# Patient Record
Sex: Female | Born: 1980 | Race: White | Hispanic: No | Marital: Married | Smoking: Never smoker
Health system: Southern US, Community
[De-identification: ages and names within clinical notes are randomized; demographics above are authoritative.]

## PROBLEM LIST (undated history)

## (undated) DIAGNOSIS — G43909 Migraine, unspecified, not intractable, without status migrainosus: Secondary | ICD-10-CM

## (undated) DIAGNOSIS — N96 Recurrent pregnancy loss: Secondary | ICD-10-CM

## (undated) DIAGNOSIS — R87619 Unspecified abnormal cytological findings in specimens from cervix uteri: Secondary | ICD-10-CM

## (undated) DIAGNOSIS — Z87898 Personal history of other specified conditions: Secondary | ICD-10-CM

## (undated) DIAGNOSIS — Z9889 Other specified postprocedural states: Secondary | ICD-10-CM

## (undated) DIAGNOSIS — K219 Gastro-esophageal reflux disease without esophagitis: Secondary | ICD-10-CM

## (undated) DIAGNOSIS — B009 Herpesviral infection, unspecified: Secondary | ICD-10-CM

## (undated) DIAGNOSIS — Z87442 Personal history of urinary calculi: Secondary | ICD-10-CM

## (undated) DIAGNOSIS — E7212 Methylenetetrahydrofolate reductase deficiency: Secondary | ICD-10-CM

## (undated) DIAGNOSIS — R112 Nausea with vomiting, unspecified: Secondary | ICD-10-CM

## (undated) DIAGNOSIS — O09519 Supervision of elderly primigravida, unspecified trimester: Secondary | ICD-10-CM

## (undated) DIAGNOSIS — Z1589 Genetic susceptibility to other disease: Secondary | ICD-10-CM

## (undated) DIAGNOSIS — D219 Benign neoplasm of connective and other soft tissue, unspecified: Secondary | ICD-10-CM

## (undated) DIAGNOSIS — Z8711 Personal history of peptic ulcer disease: Secondary | ICD-10-CM

## (undated) DIAGNOSIS — Z8719 Personal history of other diseases of the digestive system: Secondary | ICD-10-CM

## (undated) DIAGNOSIS — R011 Cardiac murmur, unspecified: Secondary | ICD-10-CM

## (undated) DIAGNOSIS — R351 Nocturia: Secondary | ICD-10-CM

## (undated) DIAGNOSIS — N979 Female infertility, unspecified: Secondary | ICD-10-CM

## (undated) DIAGNOSIS — D271 Benign neoplasm of left ovary: Secondary | ICD-10-CM

## (undated) HISTORY — DX: Benign neoplasm of left ovary: D27.1

## (undated) HISTORY — PX: LAPAROSCOPY: SHX197

## (undated) HISTORY — DX: Herpesviral infection, unspecified: B00.9

## (undated) HISTORY — PX: COLPOSCOPY: SHX161

## (undated) HISTORY — DX: Unspecified abnormal cytological findings in specimens from cervix uteri: R87.619

## (undated) HISTORY — DX: Benign neoplasm of connective and other soft tissue, unspecified: D21.9

## (undated) HISTORY — DX: Genetic susceptibility to other disease: Z15.89

## (undated) HISTORY — DX: Methylenetetrahydrofolate reductase deficiency: E72.12

---
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
## 2001-01-24 ENCOUNTER — Other Ambulatory Visit: Admission: RE | Admit: 2001-01-24 | Discharge: 2001-01-24 | Payer: Self-pay | Admitting: Obstetrics and Gynecology

## 2001-09-11 ENCOUNTER — Other Ambulatory Visit: Admission: RE | Admit: 2001-09-11 | Discharge: 2001-09-11 | Payer: Self-pay | Admitting: Obstetrics and Gynecology

## 2001-09-21 ENCOUNTER — Emergency Department (HOSPITAL_COMMUNITY): Admission: EM | Admit: 2001-09-21 | Discharge: 2001-09-21 | Payer: Self-pay | Admitting: Emergency Medicine

## 2002-09-16 ENCOUNTER — Other Ambulatory Visit: Admission: RE | Admit: 2002-09-16 | Discharge: 2002-09-16 | Payer: Self-pay | Admitting: Obstetrics and Gynecology

## 2003-10-20 ENCOUNTER — Other Ambulatory Visit: Admission: RE | Admit: 2003-10-20 | Discharge: 2003-10-20 | Payer: Self-pay | Admitting: Obstetrics and Gynecology

## 2004-11-29 ENCOUNTER — Other Ambulatory Visit: Admission: RE | Admit: 2004-11-29 | Discharge: 2004-11-29 | Payer: Self-pay | Admitting: Obstetrics and Gynecology

## 2005-07-06 ENCOUNTER — Encounter: Admission: RE | Admit: 2005-07-06 | Discharge: 2005-07-06 | Payer: Self-pay | Admitting: Orthopedic Surgery

## 2005-08-23 DIAGNOSIS — B009 Herpesviral infection, unspecified: Secondary | ICD-10-CM

## 2005-08-23 HISTORY — DX: Herpesviral infection, unspecified: B00.9

## 2005-09-06 ENCOUNTER — Other Ambulatory Visit: Admission: RE | Admit: 2005-09-06 | Discharge: 2005-09-06 | Payer: Self-pay | Admitting: Obstetrics & Gynecology

## 2005-12-03 ENCOUNTER — Other Ambulatory Visit: Admission: RE | Admit: 2005-12-03 | Discharge: 2005-12-03 | Payer: Self-pay | Admitting: Obstetrics & Gynecology

## 2006-12-19 ENCOUNTER — Other Ambulatory Visit: Admission: RE | Admit: 2006-12-19 | Discharge: 2006-12-19 | Payer: Self-pay | Admitting: Obstetrics and Gynecology

## 2007-07-10 ENCOUNTER — Other Ambulatory Visit: Admission: RE | Admit: 2007-07-10 | Discharge: 2007-07-10 | Payer: Self-pay | Admitting: Obstetrics & Gynecology

## 2007-09-05 ENCOUNTER — Other Ambulatory Visit: Admission: RE | Admit: 2007-09-05 | Discharge: 2007-09-05 | Payer: Self-pay | Admitting: Obstetrics and Gynecology

## 2007-12-30 ENCOUNTER — Encounter (HOSPITAL_COMMUNITY): Payer: Self-pay | Admitting: Obstetrics and Gynecology

## 2007-12-30 ENCOUNTER — Ambulatory Visit (HOSPITAL_COMMUNITY): Admission: RE | Admit: 2007-12-30 | Discharge: 2007-12-30 | Payer: Self-pay | Admitting: Internal Medicine

## 2007-12-30 HISTORY — PX: DILATION AND EVACUATION: SHX1459

## 2008-01-22 ENCOUNTER — Other Ambulatory Visit: Admission: RE | Admit: 2008-01-22 | Discharge: 2008-01-22 | Payer: Self-pay | Admitting: Obstetrics and Gynecology

## 2008-06-30 ENCOUNTER — Other Ambulatory Visit: Admission: RE | Admit: 2008-06-30 | Discharge: 2008-06-30 | Payer: Self-pay | Admitting: Obstetrics and Gynecology

## 2010-01-17 HISTORY — PX: AUGMENTATION MAMMAPLASTY: SUR837

## 2010-12-05 NOTE — Op Note (Signed)
Casey Clarke, Casey Clarke             ACCOUNT NO.:  [id number]   MEDICAL RECORD NO.:  [id number]          PATIENT TYPE:  AMB   LOCATION:  SDC                           FACILITY:  [facility]   PHYSICIAN:  Zelphia Cairo, MD    DATE OF BIRTH:  Nov 23, 1980   DATE OF PROCEDURE:  12/30/2007  DATE OF DISCHARGE:                               OPERATIVE REPORT   PREOPERATIVE DIAGNOSIS:  Missed abortion.   POSTOPERATIVE DIAGNOSIS:  Missed abortion.   PROCEDURE:  1. Cervical block.  2. Suction dilation and evacuation.   SURGEON:  Zelphia Cairo, MD   ANESTHESIA:  MAC and local.   SPECIMEN:  Products of conception to pathology.   ESTIMATED BLOOD LOSS:  Minimal.   COMPLICATIONS:  None.   CONDITION:  Stable to recovery room.   PROCEDURE:  The patient was taken to the operating room where anesthesia  was obtained.  She was placed in the dorsal lithotomy position.  Using  [employer], she was prepped and draped in a sterile fashion and a  catheter was used to drain her bladder for approximately 25 mL of clear  urine.  Bivalve speculum was placed in the vagina and a single-tooth  tenaculum placed on the anterior lip of the cervix.  Cervical block was  provided with 1% Nesacaine.  The cervix was then serially dilated using  Pratt dilator.  A 7-French suction catheter was then used to remove  products of conception from the uterus.  A gentle curetting was then  performed to ensure all products of conception had been removed and the  uterine cry could be felt.  Single-tooth tenaculum and speculum were  then removed, and the patient was taken to the recovery room in stable  condition.      Zelphia Cairo, MD  Electronically Signed     GA/MEDQ  D:  12/30/2007  T:  12/31/2007  Job:  [id number]

## 2011-04-19 LAB — CBC
HCT: 37.4
Hemoglobin: 13.2
MCHC: 35.3
MCV: 89.9
Platelets: 291
RBC: 4.16
RDW: 12.8
WBC: 7.7

## 2012-03-14 LAB — HM PAP SMEAR

## 2013-03-20 ENCOUNTER — Encounter: Payer: Self-pay | Admitting: Nurse Practitioner

## 2013-03-20 ENCOUNTER — Ambulatory Visit (INDEPENDENT_AMBULATORY_CARE_PROVIDER_SITE_OTHER): Payer: Commercial Managed Care - PPO | Admitting: Nurse Practitioner

## 2013-03-20 VITALS — BP 130/80 | HR 68 | Resp 16 | Ht 64.5 in | Wt 119.0 lb

## 2013-03-20 DIAGNOSIS — Z0000 Encounter for general adult medical examination without abnormal findings: Secondary | ICD-10-CM

## 2013-03-20 DIAGNOSIS — IMO0002 Reserved for concepts with insufficient information to code with codable children: Secondary | ICD-10-CM

## 2013-03-20 DIAGNOSIS — Z01419 Encounter for gynecological examination (general) (routine) without abnormal findings: Secondary | ICD-10-CM

## 2013-03-20 DIAGNOSIS — R6889 Other general symptoms and signs: Secondary | ICD-10-CM

## 2013-03-20 LAB — HEMOGLOBIN, FINGERSTICK: Hemoglobin, fingerstick: 14.4 g/dL (ref 12.0–16.0)

## 2013-03-20 NOTE — Progress Notes (Signed)
Patient ID: Casey Clarke, female   DOB: 04-12-1981, 32 y.o.   MRN: [id number] 32 y.o. G1P0010 Single Caucasian Fe here for annual exam.  Menses are regular lasting 5 days.  Moderate to light, cramps X 1 day. Some PMS. Same partner for 5 years. Condoms for birth control. Still trying to get into nursing school.  Patient and boyfriends have bought a new home, planning for long term relationship soon.  Patient's last menstrual period was 03/02/2013.          Sexually active: yes  The current method of family planning is condoms sometimes.    Exercising: no  The patient does not participate in regular exercise at present. Smoker:  no  Health Maintenance: Pap:  03/14/12, WNL TDaP:  2013 Labs: HB: 14.4 Urine: negative, pH 5.0   reports that she has never smoked. She has never used smokeless tobacco. She reports that  drinks alcohol. She reports that she does not use illicit drugs.  Past Medical History  Diagnosis Date  . 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 Dilation and curettage of uterus  12/30/07    S/P SAB  . Augmentation mammaplasty Bilateral 01/17/10    implants    No current outpatient prescriptions on file.   No current facility-administered medications for this visit.    No family history on file.  ROS:  Pertinent items are noted in HPI.  Otherwise, a comprehensive ROS was negative.  Exam:   BP 130/80  Pulse 68  Resp 16  Ht 5' 4.5" (1.638 m)  Wt 119 lb (53.978 kg)  BMI 20.12 kg/m2  LMP 03/02/2013 Height: 5' 4.5" (163.8 cm)  Ht Readings from Last 3 Encounters:  03/20/13 5' 4.5" (1.638 m)    General appearance: alert, cooperative and appears stated age Head: Normocephalic, without obvious abnormality, atraumatic Neck: no adenopathy, supple, symmetrical, trachea midline and thyroid normal to inspection and palpation Lungs: clear to auscultation bilaterally Breasts: normal appearance, no masses or tenderness, positive  findings: implants bilaterally Heart: regular rate and rhythm Abdomen: soft, non-tender; no masses,  no organomegaly Extremities: extremities normal, atraumatic, no cyanosis or edema Skin: Skin color, texture, turgor normal. No rashes or lesions Lymph nodes: Cervical, supraclavicular, and axillary nodes normal. No abnormal inguinal nodes palpated Neurologic: Grossly normal   Pelvic: External genitalia:  no lesions              Urethra:  normal appearing urethra with no masses, tenderness or lesions              Bartholin's and Skene's: normal                 Vagina: normal appearing vagina with normal color and discharge, no lesions              Cervix: anteverted              Pap taken: yes with HR HPV Bimanual Exam:  Uterus:  normal size, contour, position, consistency, mobility, non-tender              Adnexa: no mass, fullness, tenderness               Rectovaginal: Confirms               Anus:  normal sphincter tone, no lesions  A:  Well Woman with normal exam  Condoms for birth control  S/P Bilateral breast implants  History of ASCUS with +  HR HPV, Colpo without biopsy 03/08/10, normal since  P:   Pap smear as per guidelines Pap done  Counseled on breast self exam, adequate intake of calcium and vitamin D, diet and exercise return annually or prn  An After Visit Summary was printed and given to the patient.

## 2013-03-20 NOTE — Patient Instructions (Signed)

## 2013-03-23 NOTE — Progress Notes (Signed)
Encounter reviewed by Dr. Brook Silva.  

## 2013-03-25 LAB — IPS PAP TEST WITH HPV

## 2013-03-27 NOTE — Addendum Note (Signed)
Addended by: Ria Comment R on: 03/27/2013 08:00 PM   Modules accepted: Orders

## 2013-03-31 ENCOUNTER — Telehealth: Payer: Self-pay | Admitting: Nurse Practitioner

## 2013-03-31 NOTE — Telephone Encounter (Signed)
Spoke with patient about lab results, pap resulted still waiting for HRHPV 16&18 Told patient when we get the results Casey Clarke will review and someone will call you back.

## 2013-03-31 NOTE — Telephone Encounter (Signed)
Patient has a question about results from her AEX.

## 2013-04-01 ENCOUNTER — Other Ambulatory Visit: Payer: Self-pay | Admitting: Nurse Practitioner

## 2013-04-01 DIAGNOSIS — IMO0002 Reserved for concepts with insufficient information to code with codable children: Secondary | ICD-10-CM

## 2013-04-02 ENCOUNTER — Telehealth: Payer: Self-pay | Admitting: Nurse Practitioner

## 2013-04-02 NOTE — Telephone Encounter (Signed)
Patient calling back to discuss test results

## 2013-04-02 NOTE — Telephone Encounter (Signed)
Casey Clarke will you schedule her colpo biopsy, and let her know resutls

## 2013-04-03 NOTE — Telephone Encounter (Signed)
Colpo/Bx scheduled for 04/10/13 w/ Dr. Edward Jolly cm

## 2013-04-10 ENCOUNTER — Ambulatory Visit: Payer: PRIVATE HEALTH INSURANCE

## 2013-04-10 ENCOUNTER — Ambulatory Visit (INDEPENDENT_AMBULATORY_CARE_PROVIDER_SITE_OTHER): Payer: Commercial Managed Care - PPO | Admitting: Obstetrics and Gynecology

## 2013-04-10 ENCOUNTER — Encounter: Payer: Self-pay | Admitting: Obstetrics and Gynecology

## 2013-04-10 DIAGNOSIS — R87810 Cervical high risk human papillomavirus (HPV) DNA test positive: Secondary | ICD-10-CM

## 2013-04-10 DIAGNOSIS — IMO0002 Reserved for concepts with insufficient information to code with codable children: Secondary | ICD-10-CM

## 2013-04-10 DIAGNOSIS — R6889 Other general symptoms and signs: Secondary | ICD-10-CM

## 2013-04-10 NOTE — Progress Notes (Signed)
Subjective:     Patient ID: ICESS BERTONI, female   DOB: April 21, 1981, 32 y.o.   MRN: [id number]  HPI  Pap ASCUS and high risk HPV positive on 03/20/13.  Positive for types 18/45.  History of ASCUS with + HR HPV, Colpo without biopsy 03/08/10, normal since  LMP 03/31/13 - normal.  Contraception - condoms.     Review of Systems     Objective:   Physical Exam  Genitourinary:           Colposcopy - consent performed.  Speculum placed.  Acetic acid placed.  Satisfactory colposcopy.   See diagrams above.  Two specimens sent - ECC and then biopsies of 3 and 11 o'clock sent together to pathology. Monsels placed.  Minimal EBL.  No complications.   Acetic acid soaked gauze placed on vulva.  No lesions noted.  Assessment:     HPV changes. ASCUS pap and positive HR HPV 18/45.    Plan:     Follow up biopsies. Discussed treatment with LEEP if moderative or high grade dysplasia is noted. Otherwise, anticipate next pap and HPV testing in 6 - 12 months.

## 2013-04-15 ENCOUNTER — Telehealth: Payer: Self-pay | Admitting: Emergency Medicine

## 2013-04-15 ENCOUNTER — Ambulatory Visit: Payer: Self-pay | Admitting: Obstetrics and Gynecology

## 2013-04-15 NOTE — Telephone Encounter (Signed)
Spoke with patient. Message given. Patient verbalizes understanding and agreeable for follow up.

## 2013-04-15 NOTE — Telephone Encounter (Signed)
Spoke with patient. Please see telephone encounter dated 9/24

## 2013-04-15 NOTE — Telephone Encounter (Signed)
Pap recall placed.  

## 2013-04-15 NOTE — Telephone Encounter (Signed)
Patient had colposcopy on the 19 th with Dr. Edward Jolly.  Noticed "band aid" looking flesh lump. Wants to know if this is normal. Patient denies complaints. No fevers, no vaginal dc, no abdominal pain. S/W Dr. Edward Jolly, likely monsel's solution is coming out. To notify patient no concern, to give colposcopy results.

## 2013-04-15 NOTE — Progress Notes (Signed)
Spoke with patient and message given. Verbalized understanding. Placed in Pap Recall.

## 2013-04-15 NOTE — Telephone Encounter (Signed)
Patient is having some problems after her recent procedure.

## 2013-04-15 NOTE — Telephone Encounter (Signed)
Message copied by FASTMarc Morgans on Wed Apr 15, 2013  9:35 AM ------      Message from: Conley Simmonds      Created: Wed Apr 15, 2013  9:05 AM       Phone call reviewed with you just now.      Tissue that patient passed is actually the dried Monsel's solution.  Nothing to worry about or treat.      Biopsies showed atypia, the same as the pap showed.      Patient does not need treatment.      She needs a follow up pap and HPV testing in one year.      Pap recall - 08. ------

## 2013-08-17 ENCOUNTER — Telehealth: Payer: Self-pay | Admitting: Nurse Practitioner

## 2013-08-17 ENCOUNTER — Encounter: Payer: Self-pay | Admitting: Gynecology

## 2013-08-17 ENCOUNTER — Ambulatory Visit (INDEPENDENT_AMBULATORY_CARE_PROVIDER_SITE_OTHER): Payer: PRIVATE HEALTH INSURANCE | Admitting: Gynecology

## 2013-08-17 VITALS — BP 122/96 | HR 97 | Temp 97.9°F | Resp 18 | Ht 64.5 in | Wt 122.0 lb

## 2013-08-17 DIAGNOSIS — N3000 Acute cystitis without hematuria: Secondary | ICD-10-CM

## 2013-08-17 DIAGNOSIS — R300 Dysuria: Secondary | ICD-10-CM

## 2013-08-17 LAB — POCT URINALYSIS DIPSTICK
PH UA: 5
RBC UA: 1
UROBILINOGEN UA: NEGATIVE

## 2013-08-17 MED ORDER — CIPROFLOXACIN HCL 500 MG PO TABS: 500.0000 mg | ORAL_TABLET | Freq: Two times a day (BID) | ORAL | Status: DC

## 2013-08-17 MED ORDER — PHENAZOPYRIDINE HCL 200 MG PO TABS: 200.0000 mg | ORAL_TABLET | Freq: Three times a day (TID) | ORAL | Status: DC | PRN

## 2013-08-17 NOTE — Progress Notes (Signed)
  Subjective:    Casey Clarke is a 33 y.o. female who complains of burning with urination. She has had symptoms for 1 day. Patient also complains of none. Patient denies back pain, fever and headache. Patient does not have a history of recurrent UTI. Patient does not have a history of pyelonephritis. No vaginal discharge.  Urinates after sex, last sex 2d ago.   The following portions of the patient's history were reviewed and updated as appropriate: allergies, current medications, past family history, past medical history, past social history, past surgical history and problem list.  Review of Systems Pertinent items are noted in HPI.    Objective:    BP 122/96  Pulse 97  Temp(Src) 97.9 F (36.6 C)  Resp 18  Ht 5' 4.5" (1.638 m)  Wt 122 lb (55.339 kg)  BMI 20.63 kg/m2  LMP 08/03/2013 General appearance: alert, cooperative and appears stated age Back: no CVAT Abdomen: no tenderness Pelvic: cervix normal in appearance, external genitalia normal, no adnexal masses or tenderness, no cervical motion tenderness, uterus normal size, shape, and consistency and vagina normal without discharge Extremities: extremities normal, atraumatic, no cyanosis or edema  Laboratory:  Urine dipstick: sp gravity 5.  Small ketones, blood 1+ Micro exam: not done.    Assessment:    Acute cystitis     Plan:    Medications: ciprofloxacin. Maintain adequate hydration. Follow up if symptoms not improving, and as needed.

## 2013-08-17 NOTE — Telephone Encounter (Signed)
Patient calling thinking she may have a "uti." Patient declined an appointment as she wants to speak with the nurse first to see if we can call something in. [facility] on [address] [phone]

## 2013-08-17 NOTE — Patient Instructions (Signed)
  Place urinary tract infection patient instructions here.

## 2013-08-17 NOTE — Telephone Encounter (Signed)
Spoke with patient. Aware of OV. Will come in for appointment.  Routing to provider for final review. Patient agreeable to disposition. Will close encounter

## 2013-08-17 NOTE — Telephone Encounter (Signed)
Spoke with patient. She has been having dysuria x 1 day. Took one tablet of Cipro 500 mg (of an old rx that she had) and she is feeling much improved.  She states that an rx is usually called in for her as she is unable to get off of work.  Advised that it is our office policy that urinary tract infection symptoms require an office visit as they are not always uti and could be other issues that need to be ruled out.  Discussed with Dr. Charlies Constable, agreed needs office visit.  Patient agreeable to late office visit if possible.  Late appointment scheduled, and Message left to return call to [place] at [phone]. Patient scheduled with Dr. Charlies Constable for today at 1830.

## 2013-08-19 LAB — URINE CULTURE
Colony Count: NO GROWTH
ORGANISM ID, BACTERIA: NO GROWTH

## 2013-12-11 ENCOUNTER — Ambulatory Visit: Payer: Self-pay | Admitting: Cardiovascular Disease

## 2013-12-16 ENCOUNTER — Other Ambulatory Visit: Payer: Self-pay | Admitting: Plastic Surgery

## 2014-01-08 HISTORY — PX: TRANSTHORACIC ECHOCARDIOGRAM: SHX275

## 2014-01-29 ENCOUNTER — Other Ambulatory Visit: Payer: Self-pay | Admitting: Plastic Surgery

## 2014-03-11 ENCOUNTER — Encounter: Payer: Self-pay | Admitting: Cardiology

## 2014-03-11 ENCOUNTER — Ambulatory Visit (INDEPENDENT_AMBULATORY_CARE_PROVIDER_SITE_OTHER): Payer: BC Managed Care – PPO | Admitting: Cardiology

## 2014-03-11 VITALS — BP 130/90 | HR 104 | Ht 64.5 in | Wt 121.0 lb

## 2014-03-11 DIAGNOSIS — R072 Precordial pain: Secondary | ICD-10-CM

## 2014-03-11 DIAGNOSIS — R079 Chest pain, unspecified: Secondary | ICD-10-CM | POA: Insufficient documentation

## 2014-03-11 DIAGNOSIS — I079 Rheumatic tricuspid valve disease, unspecified: Secondary | ICD-10-CM

## 2014-03-11 DIAGNOSIS — R000 Tachycardia, unspecified: Secondary | ICD-10-CM

## 2014-03-11 DIAGNOSIS — I071 Rheumatic tricuspid insufficiency: Secondary | ICD-10-CM

## 2014-03-11 DIAGNOSIS — R002 Palpitations: Secondary | ICD-10-CM

## 2014-03-11 HISTORY — DX: Palpitations: R00.2

## 2014-03-11 NOTE — Progress Notes (Signed)
     HPI: 33 yo female for evaluation of chest pain and palpitations. Echocardiogram in June of 2015 performed in [place] showed normal LV function, mild to moderate tricuspid regurgitation. Patient does not have dyspnea on exertion, orthopnea, PND, pedal edema, history of syncope or exertional chest pain. She was noted to have tachycardia when she was in high school by her report. She had an echocardiogram and monitor that apparently were unremarkable. No reports available. She was placed initially on a beta blocker. She improved but over the past 3-4 years she has had more palpitations. They are described as her heart flipping over. She does not have sustained palpitations. No associated syncope, dyspnea. She also occasionally has brief pain in her chest in the left breast area. It lasts seconds and resolved. Not related to exertion. She saw Dr. Wynonia Lawman and had a 30 day monitor that was unremarkable. Results not available. She has also had blood work.  Current Outpatient Prescriptions  Medication Sig Dispense Refill  . Multiple Vitamins-Minerals (MULTIVITAMIN PO) Take by mouth.      . zonisamide (ZONEGRAN) 50 MG capsule Take 50 mg by mouth daily.       No current facility-administered medications for this visit.    Allergies  Allergen Reactions  . Hydrocodeine [Dihydrocodeine]     Heart races    Past Medical History  Diagnosis Date  . 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 Augmentation mammaplasty Bilateral 01/17/10    implants  . Dilation and curettage of uterus  12/30/07    S/P SAB    History   Social History  . Marital Status: Single    Spouse Name: N/A    Number of Children: 0  . Years of Education: N/A   Occupational History  . Not on file.   Social History Main Topics  . Smoking status: Never Smoker   . Smokeless tobacco: Never Used  . Alcohol Use: Yes     Comment: social  . Drug Use: No  . Sexual Activity: Yes   Partners: Male    Birth Control/ Protection: Condom   Other Topics Concern  . Not on file   Social History Narrative  . No narrative on file    Family History  Problem Relation Age of Onset  . Thyroid disease Mother   . Hypertension Maternal Grandmother   . Thyroid disease Maternal Grandmother   . Heart attack Maternal Grandfather   . Cancer Maternal Grandfather     lymph nodes  . Cancer Paternal Grandmother     stomach  . Heart disease Paternal Grandfather     Psychologist, forensic  . Hypotension Paternal Grandfather     ROS: no fevers or chills, productive cough, hemoptysis, dysphasia, odynophagia, melena, hematochezia, dysuria, hematuria, rash, seizure activity, orthopnea, PND, pedal edema, claudication. Remaining systems are negative.  Physical Exam:   Blood pressure 130/90, pulse 104, height 5' 4.5" (1.638 m), weight 121 lb (54.885 kg).  General:  Well developed/well nourished in NAD Skin warm/dry Patient not depressed No peripheral clubbing Back-normal HEENT-normal/normal eyelids Neck supple/normal carotid upstroke bilaterally; no bruits; no JVD; no thyromegaly chest - CTA/ normal expansion CV - RRR/normal S1 and S2; no murmurs, rubs or gallops;  PMI nondisplaced Abdomen -NT/ND, no HSM, no mass, + bowel sounds, no bruit 2+ femoral pulses, no bruits Ext-no edema, chords, 2+ DP Neuro-grossly nonfocal  ECG Sinus tachycardia, incomplete right bundle branch block, right atrial enlargement.

## 2014-03-11 NOTE — Assessment & Plan Note (Addendum)
We will obtain actual images from recent echocardiogram to review. Note she does have right atrial enlargement and incomplete right bundle branch block on ECG.

## 2014-03-11 NOTE — Patient Instructions (Signed)
Your physician wants you to follow-up in: 6 MONTHS WITH DR CRENSHAW You will receive a reminder letter in the mail two months in advance. If you don't receive a letter, please call our office to schedule the follow-up appointment.  

## 2014-03-11 NOTE — Assessment & Plan Note (Signed)
Symptoms sound potentially to be PACs or PVCs. Obtain monitor results from Dr. Wynonia Lawman. I will also obtain recent blood work including TSH from her primary care. We can consider a beta blocker in the future. 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 She states she did not have symptoms when her recent monitor was in place.

## 2014-03-11 NOTE — Assessment & Plan Note (Signed)
Symptoms atypical. Pain does not sound cardiac mediated. No further cardiac workup at this point.

## 2014-03-26 ENCOUNTER — Ambulatory Visit: Payer: Commercial Managed Care - PPO | Admitting: Nurse Practitioner

## 2014-04-06 ENCOUNTER — Encounter: Payer: Self-pay | Admitting: Nurse Practitioner

## 2014-04-06 ENCOUNTER — Ambulatory Visit (INDEPENDENT_AMBULATORY_CARE_PROVIDER_SITE_OTHER): Payer: BC Managed Care – PPO | Admitting: Nurse Practitioner

## 2014-04-06 VITALS — BP 120/74 | HR 80 | Ht 64.75 in | Wt 119.0 lb

## 2014-04-06 DIAGNOSIS — Z01419 Encounter for gynecological examination (general) (routine) without abnormal findings: Secondary | ICD-10-CM

## 2014-04-06 DIAGNOSIS — Z0000 Encounter for general adult medical examination without abnormal findings: Secondary | ICD-10-CM

## 2014-04-06 LAB — POCT URINALYSIS DIPSTICK
Bilirubin, UA: NEGATIVE
GLUCOSE UA: NEGATIVE
Ketones, UA: NEGATIVE
Leukocytes, UA: NEGATIVE
NITRITE UA: NEGATIVE
Protein, UA: NEGATIVE
RBC UA: NEGATIVE
Urobilinogen, UA: NEGATIVE
pH, UA: 6

## 2014-04-06 NOTE — Progress Notes (Signed)
Patient ID: Casey Clarke, female   DOB: 09-Dec-1980, 33 y.o.   MRN: [id number] 33 y.o. G1P0010 Single Caucasian Fe here for annual exam. Now engaged.  Plans wedding for next fall.  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  Condoms for birth control.  Migraines seem to be related to midcycle and again after cycle ends.  Now on Zonegran for 2 months. Will see Dr. Richrd Prime in 2 months.   Patient's last menstrual period was 03/13/2014.          Sexually active: yes  The current method of family planning is condoms sometimes.  Exercising: yes, walking and exercise DVD's  Smoker: no   Health Maintenance:  Pap: 03/20/13, ASCUS, pos 16/18; colpo, repeat pap in one year TDaP: 2013  Labs: HB:  14.3  Urine:  Negative    reports that she has never smoked. She has never used smokeless tobacco. She reports that she drinks alcohol. She reports that she does not use illicit drugs.  Past Medical History  Diagnosis Date  . 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 Abnormal Pap smear of cervix 2014    ASCUS, pos HR HPV, 18/45    Past Surgical History  Procedure Laterality Date  . Augmentation mammaplasty Bilateral 01/17/10    implants  . Dilation and curettage of uterus  12/30/07    S/P SAB    Current Outpatient Prescriptions  Medication Sig Dispense Refill  . Multiple Vitamins-Minerals (MULTIVITAMIN PO) Take by mouth.      . zonisamide (ZONEGRAN) 100 MG capsule Take 100 mg by mouth daily.       No current facility-administered medications for this visit.    Family History  Problem Relation Age of Onset  . Thyroid disease Mother   . Hypertension Maternal Grandmother   . Thyroid disease Maternal Grandmother   . Heart attack Maternal Grandfather   . Cancer Maternal Grandfather     lymph nodes  . Cancer Paternal Grandmother     stomach  . Heart disease Paternal Grandfather     Psychologist, forensic  . Hypotension Paternal Grandfather     ROS:  Pertinent items are noted in HPI.  Otherwise, a  comprehensive ROS was negative.  Exam:   BP 120/74  Pulse 80  Ht 5' 4.75" (1.645 m)  Wt 119 lb (53.978 kg)  BMI 19.95 kg/m2  LMP 03/13/2014 Height: 5' 4.75" (164.5 cm)  Ht Readings from Last 3 Encounters:  04/06/14 5' 4.75" (1.645 m)  03/11/14 5' 4.5" (1.638 m)  08/17/13 5' 4.5" (1.638 m)    General appearance: alert, cooperative and appears stated age Head: Normocephalic, without obvious abnormality, atraumatic Neck: no adenopathy, supple, symmetrical, trachea midline and thyroid normal to inspection and palpation Lungs: clear to auscultation bilaterally Breasts: normal appearance, no masses or tenderness Heart: regular rate and rhythm Abdomen: soft, non-tender; no masses,  no organomegaly Extremities: extremities normal, atraumatic, no cyanosis or edema Skin: Skin color, texture, turgor normal. No rashes or lesions Lymph nodes: Cervical, supraclavicular, and axillary nodes normal. No abnormal inguinal nodes palpated Neurologic: Grossly normal   Pelvic: External genitalia:  no lesions              Urethra:  normal appearing urethra with no masses, tenderness or lesions              Bartholin's and Skene's: normal                 Vagina: normal appearing vagina with  normal color and discharge, no lesions              Cervix: anteverted              Pap taken: Yes.   Bimanual Exam:  Uterus:  normal size, contour, position, consistency, mobility, non-tender              Adnexa: no mass, fullness, tenderness               Rectovaginal: Confirms               Anus:  normal sphincter tone, no lesions  A:  Well Woman with normal exam  Condoms for birth control   S/P Bilateral breast implants   History of ASCUS with + HR HPV, Colpo without biopsy 03/08/10, Colpo biopsy 03/2013 with HPV changes   P:   Reviewed health and wellness pertinent to exam  Pap smear taken today  Will follow with pap  Counseled on breast self exam, adequate intake of calcium and vitamin D, diet and  exercise return annually or prn  An After Visit Summary was printed and given to the patient.

## 2014-04-06 NOTE — Patient Instructions (Signed)

## 2014-04-07 ENCOUNTER — Telehealth: Payer: Self-pay | Admitting: *Deleted

## 2014-04-07 LAB — HEMOGLOBIN, FINGERSTICK: Hemoglobin, fingerstick: 14.3 g/dL (ref 12.0–16.0)

## 2014-04-07 NOTE — Telephone Encounter (Signed)
Spoke with pt, aware of trivial tricuspid regurg

## 2014-04-07 NOTE — Telephone Encounter (Signed)
Pt was calling Hilda Blades back in regards to ultrasound results

## 2014-04-07 NOTE — Telephone Encounter (Signed)
Left message for pt to call, CD of echo done at [place] [facility] reviewed by dr Stanford Breed, there is only trivial tricuspid regurg.

## 2014-04-09 LAB — IPS PAP TEST WITH HPV

## 2014-04-13 NOTE — Progress Notes (Signed)
Encounter reviewed by Dr. Brook Silva.  

## 2014-04-22 ENCOUNTER — Telehealth: Payer: Self-pay | Admitting: Emergency Medicine

## 2014-04-22 DIAGNOSIS — R87810 Cervical high risk human papillomavirus (HPV) DNA test positive: Secondary | ICD-10-CM

## 2014-04-22 NOTE — Telephone Encounter (Signed)
Pr $5 copay

## 2014-04-22 NOTE — Telephone Encounter (Signed)
Thanks for the update

## 2014-04-22 NOTE — Telephone Encounter (Signed)
Pap-Negative for Intraepithelial Lesions or Malignancy HR HPV-Positive.   Patient given results and advised of need for colposcopy for closer evaluation of cervical cells. Patient is agreeable. Using condoms only for birth control. LMP 04/15/14. Advised need to have procedure done in first 12 days of cycle. Patient to call back with first day of cycle, advised if starts over the weekend, call first thing Monday morning. She is agreeable to this and verbalized understanding.    Patient is advised that she will be contacted with out of pocket costs for colposcopy. She is agreeable.  Routing to [employer] to contact patient with  insurance pre certification information.

## 2014-04-22 NOTE — Telephone Encounter (Signed)
Message copied by Michele Mcalpine on Thu Apr 22, 2014  9:20 AM ------      Message from: Antonietta Barcelona      Created: Tue Apr 20, 2014  5:31 PM       Per Dr. Horald Pollen she needs a repeat colpo biopsy ------

## 2014-05-07 ENCOUNTER — Other Ambulatory Visit: Payer: Self-pay

## 2014-05-18 NOTE — Telephone Encounter (Signed)
Patient started her menstrual cycle today and is calling to schedule her colposcopy.

## 2014-05-18 NOTE — Telephone Encounter (Signed)
Spoke with patient. Appointment scheduled for Nov 4th at 3:30pm (time per Gay Filler). Patient is agreeable to date and time. Colposcopy pre-procedure instructions given. Motrin instructions given. Motrin=Advil=Ibuprofen Can take 800 mg (Can purchase over the counter, you will need four 200 mg pills) every 8 hours as needed.  Take with food. Make sure to eat a meal before appointment and drink plenty of fluids. Patient verbalized understanding and will call to reschedule if will be on menses or has any concerns regarding pregnancy. Advised will need to cancel within 24 hours or will have $100.00 no show fee placed to account.   Routing to provider for final review. Patient agreeable to disposition. Will close encounter

## 2014-05-18 NOTE — Telephone Encounter (Signed)
Spoke with patient. Patient started menses today and would like to schedule colposcopy. Offered appointment on Friday 10/30 at 3pm with Dr.Silva but patient declines. Offered Nov 4th at 3pm with Dr.Silva but patient declines. 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 "It is hard for me to miss clinicals or work. I can come in next Monday after 12pm, Tuesday after 12pm before 3pm, Wednesday after 3 pm, Thursday before 3pm, and Friday after 3pm." Advised patient will have to speak with provider regarding scheduling and return call with appointment date and time options. Patient is agreeable

## 2014-05-24 ENCOUNTER — Encounter: Payer: Self-pay | Admitting: Nurse Practitioner

## 2014-05-26 ENCOUNTER — Ambulatory Visit (INDEPENDENT_AMBULATORY_CARE_PROVIDER_SITE_OTHER): Payer: BC Managed Care – PPO | Admitting: Obstetrics and Gynecology

## 2014-05-26 ENCOUNTER — Encounter: Payer: Self-pay | Admitting: Obstetrics and Gynecology

## 2014-05-26 DIAGNOSIS — R87810 Cervical high risk human papillomavirus (HPV) DNA test positive: Secondary | ICD-10-CM

## 2014-05-26 NOTE — Progress Notes (Signed)
Subjective:     Patient ID: Casey Clarke, female   DOB: July 19, 1981, 33 y.o.   MRN: [id number]  HPI    Patient is here for repeat colposcopy.   Pap ASCUS and high risk HPV positive on 03/20/13. Positive for types 18/45. Colposcopy 04/10/13 - ECC negative, Bx atypia. Pap 04/06/14 - WNL, positive HR HPV.   History of prior ASCUS with + HR HPV, Colpo without biopsy 03/08/10.  LMP 05/17/14. Condoms for contraception.  States no chance of pregnancy.  Interested in future childbearing.   Review of Systems     Objective:   Physical Exam  Genitourinary:     Consent for colposcopy.  Speculum placed in vagina.  Acetic acid placed.  See colposcopy image.  Satisfactory colposcopy. Subtle acetowhite change noted at 12 o'clock.   ECC and biopsy of exocervix at 12 o'clock taken and sent to pathology separately.  Monsels placed. Minimal EBL.  No complications.     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     Plan:     Post procedure instructions given.  Follow up biopsy results.  Discussion regarding abnormal paps, HPV, and treatment with LEEP for moderate or high grade dysplasia. Anticipate pap and repeat HPV testing in one year.

## 2014-05-26 NOTE — Patient Instructions (Signed)

## 2014-05-27 ENCOUNTER — Encounter: Payer: Self-pay | Admitting: Obstetrics and Gynecology

## 2014-05-31 LAB — IPS OTHER TISSUE BIOPSY

## 2015-02-21 ENCOUNTER — Ambulatory Visit (INDEPENDENT_AMBULATORY_CARE_PROVIDER_SITE_OTHER): Payer: 59 | Admitting: Nurse Practitioner

## 2015-02-21 ENCOUNTER — Encounter: Payer: Self-pay | Admitting: Nurse Practitioner

## 2015-02-21 VITALS — BP 120/76 | HR 80 | Temp 98.2°F | Ht 64.75 in | Wt 126.0 lb

## 2015-02-21 DIAGNOSIS — R300 Dysuria: Secondary | ICD-10-CM | POA: Diagnosis not present

## 2015-02-21 MED ORDER — PHENAZOPYRIDINE HCL 200 MG PO TABS: 200.0000 mg | ORAL_TABLET | Freq: Three times a day (TID) | ORAL | Status: DC | PRN

## 2015-02-21 NOTE — Progress Notes (Signed)
S:  34 y.o. married white G52  female presents with complaint of UTI. Symptoms began Over the past month. With symptoms of dysuria, urinary frequency.  The patient is having no constitutional symptoms, denying fever, chills, anorexia, or weight loss.  She is  sexually active and  symptoms are not related to post coital.  Current method of birth control is condoms.  No complaints of vaginal dryness.   Same partner without change. Last UTI documented over a year ago. Denies vaginal discharge or symptoms related to menses.  Nocturia X 2 nightly.  No change in diet and does consume most of fluids during the day.  ROS: no weight loss, fever, night sweats and feels well  O alert, oriented to person, place, and time, normal mood, behavior, speech, dress, motor activity, and thought processes   thin, healthy,  alert and  not in acute distress  Abdomen: no pain or pressure  No CVA tenderness  deferred   Diagnostic Test:    Urinalysis:  normal   urine culture  Assessment: R/O UTI   Urinary frequency and nocturia  Plan:  Maintain adequate hydration. Follow up if symptoms not improving, and as needed.   Medication Therapy: hold antibiotic therapy until results of C&S   Lab: follow with urine C&S   Pyridium prn for spasmodic pain and  frequency    RV

## 2015-02-21 NOTE — Patient Instructions (Signed)
Will follow with urine C&S 

## 2015-02-22 LAB — URINALYSIS, MICROSCOPIC ONLY
Bacteria, UA: NONE SEEN [HPF]
Casts: NONE SEEN [LPF]
Crystals: NONE SEEN [HPF]
RBC / HPF: NONE SEEN RBC/HPF (ref <=2)
SQUAMOUS EPITHELIAL / LPF: NONE SEEN [HPF] (ref <=5)
WBC UA: NONE SEEN WBC/HPF (ref <=5)
YEAST: NONE SEEN [HPF]

## 2015-02-23 LAB — URINE CULTURE
COLONY COUNT: NO GROWTH
ORGANISM ID, BACTERIA: NO GROWTH

## 2015-02-26 NOTE — Progress Notes (Signed)
Encounter reviewed by Dr. Mikesha Migliaccio Amundson C. Silva.  

## 2015-04-14 ENCOUNTER — Ambulatory Visit: Payer: Self-pay | Admitting: Nurse Practitioner

## 2015-04-29 ENCOUNTER — Ambulatory Visit (INDEPENDENT_AMBULATORY_CARE_PROVIDER_SITE_OTHER): Payer: 59 | Admitting: Nurse Practitioner

## 2015-04-29 ENCOUNTER — Encounter: Payer: Self-pay | Admitting: Nurse Practitioner

## 2015-04-29 VITALS — BP 120/76 | HR 72 | Ht 64.75 in | Wt 129.0 lb

## 2015-04-29 DIAGNOSIS — Z01419 Encounter for gynecological examination (general) (routine) without abnormal findings: Secondary | ICD-10-CM | POA: Diagnosis not present

## 2015-04-29 DIAGNOSIS — Z0000 Encounter for general adult medical examination without abnormal findings: Secondary | ICD-10-CM | POA: Diagnosis not present

## 2015-04-29 LAB — POCT URINALYSIS DIPSTICK
Bilirubin, UA: NEGATIVE
Blood, UA: NEGATIVE
GLUCOSE UA: NEGATIVE
Ketones, UA: NEGATIVE
Leukocytes, UA: NEGATIVE
NITRITE UA: NEGATIVE
Protein, UA: NEGATIVE
Urobilinogen, UA: NEGATIVE
pH, UA: 6

## 2015-04-29 LAB — HEMOGLOBIN, FINGERSTICK: Hemoglobin, fingerstick: 13.7 g/dL (ref 12.0–16.0)

## 2015-04-29 NOTE — Progress Notes (Signed)
Patient ID: Casey Clarke, female   DOB: 25-Jul-1980, 34 y.o.   MRN: [id number] 34 y.o. G1P0010 Married  Caucasian Fe here for annual exam.menses now at 5-6 days.  Moderate to light.  Some cramps X 1 day.  Using condoms and rhytm method.  She really hopes this pap is normal.  She may want to consider a pregnancy by spring if all is well.    Patient's last menstrual period was 04/14/2015 (exact date).          Sexually active: Yes.    The current method of family planning is condoms sometimes.    Exercising: Yes.    walking, nursing student Smoker:  no  Health Maintenance: Pap: 04/06/14, Negative with + HR HPV, Colpo 05/26/14, LGSIL, ECC negative;  03/20/13, ASCUS, pos 16/18; colpo, 04/10/13, atypia consistent with HPV related change TDaP: 2013  MMR II: and titer has been done Labs: HB: 13.7    Urine:  negative   reports that she has never smoked. She has never used smokeless tobacco. She reports that she drinks alcohol. She reports that she does not use illicit drugs.  Past Medical History  Diagnosis Date  . 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 Abnormal Pap smear of cervix 2014, 03/2014    ASCUS, pos HR HPV, 18/45; 2015 LGSIL, + HR HPV    Past Surgical History  Procedure Laterality Date  . Augmentation mammaplasty Bilateral 01/17/10    implants  . Dilation and curettage of uterus  12/30/07    S/P SAB  . Colposcopy  2014, 2015    2015 LGSIL, neg ECC    Current Outpatient Prescriptions  Medication Sig Dispense Refill  . Multiple Vitamins-Minerals (MULTIVITAMIN PO) Take by mouth.     No current facility-administered medications for this visit.    Family History  Problem Relation Age of Onset  . Thyroid disease Mother   . Hypertension Maternal Grandmother   . Thyroid disease Maternal Grandmother   . Heart attack Maternal Grandfather   . Cancer Maternal Grandfather     lymph nodes  . Cancer Paternal Grandmother     stomach  . Heart disease Paternal Grandfather     Psychologist, forensic  .  Hypotension Paternal Grandfather     ROS:  Pertinent items are noted in HPI.  Otherwise, a comprehensive ROS was negative.  Exam:   BP 120/76 mmHg  Pulse 72  Ht 5' 4.75" (1.645 m)  Wt 129 lb (58.514 kg)  BMI 21.62 kg/m2  LMP 04/14/2015 (Exact Date) Height: 5' 4.75" (164.5 cm) Ht Readings from Last 3 Encounters:  04/29/15 5' 4.75" (1.645 m)  02/21/15 5' 4.75" (1.645 m)  05/26/14 5' 4.75" (1.645 m)    General appearance: alert, cooperative and appears stated age Head: Normocephalic, without obvious abnormality, atraumatic Neck: no adenopathy, supple, symmetrical, trachea midline and thyroid normal to inspection and palpation Lungs: clear to auscultation bilaterally Breasts: normal appearance, no masses or tenderness Heart: regular rate and rhythm Abdomen: soft, non-tender; no masses,  no organomegaly Extremities: extremities normal, atraumatic, no cyanosis or edema Skin: Skin color, texture, turgor normal. No rashes or lesions Lymph nodes: Cervical, supraclavicular, and axillary nodes normal. No abnormal inguinal nodes palpated Neurologic: Grossly normal   Pelvic: External genitalia:  no lesions              Urethra:  normal appearing urethra with no masses, tenderness or lesions              Bartholin's and Skene's: normal  Vagina: normal appearing vagina with normal color and discharge, no lesions              Cervix: anteverted              Pap taken: Yes.   Bimanual Exam:  Uterus:  normal size, contour, position, consistency, mobility, non-tender              Adnexa: no mass, fullness, tenderness               Rectovaginal: Confirms               Anus:  normal sphincter tone, no lesions  Chaperone present: yes  A:  Well Woman with normal exam  Condoms for birth control  S/P Bilateral breast implants  History of ASCUS with + HR HPV, Colpo without biopsy 03/08/10, Colpo biopsy 03/2013 with HPV changes, Colpo biopsy 11/15  LGSIL     P:   Reviewed health and wellness pertinent to exam  Pap smear as above  Encouraged to take prenatal MVI  Counseled on breast self exam, adequate intake of calcium and vitamin D, diet and exercise return annually or prn  An After Visit Summary was printed and given to the patient.

## 2015-04-29 NOTE — Patient Instructions (Signed)

## 2015-04-30 NOTE — Progress Notes (Signed)
Encounter reviewed by Dr. Brook Amundson C. Silva.  

## 2015-05-03 LAB — IPS PAP TEST WITH HPV

## 2015-05-11 ENCOUNTER — Telehealth: Payer: Self-pay | Admitting: Nurse Practitioner

## 2015-05-11 NOTE — Telephone Encounter (Signed)
Patient has some questions about pap results would like to speak with nurse. Best contact # [phone]

## 2015-05-11 NOTE — Telephone Encounter (Signed)
Spoke with patient. She received results on Mychart of pap smear. ASCUS with negative High Risk HPV. Advised patient no colposcopy needed at this time as HPV testing was negative. Good news! Patient expresses that she has concerns about waiting one year to repeat pap smear, discussed recommendations from Dr. Quincy Simmonds. Patient agreeable and will follow up as scheduled. 08 Recall in place. Patient has annual exam scheduled.  Routing to provider for final review. Patient agreeable to disposition. Will close encounter.

## 2015-06-20 ENCOUNTER — Encounter: Payer: Self-pay | Admitting: Nurse Practitioner

## 2015-06-20 ENCOUNTER — Ambulatory Visit (INDEPENDENT_AMBULATORY_CARE_PROVIDER_SITE_OTHER): Payer: 59 | Admitting: Nurse Practitioner

## 2015-06-20 VITALS — BP 110/80 | HR 104 | Resp 16 | Wt 129.0 lb

## 2015-06-20 DIAGNOSIS — N926 Irregular menstruation, unspecified: Secondary | ICD-10-CM | POA: Diagnosis not present

## 2015-06-20 LAB — POCT URINE PREGNANCY: PREG TEST UR: POSITIVE — AB

## 2015-06-20 NOTE — Progress Notes (Signed)
Subjective:     Patient ID: Casey Clarke, female   DOB: Nov 29, 1980, 34 y.o.   MRN: [id number]  HPI  This 34 yo WM Fe G1P0 presents with amenorrhea.  Her LMP was 05/14/15 and a positive UPT at home.   She is somewhat concerned with some light brown vaginal spotting with wiping only.  She has no pain or cramps.  She does have a history of miscarriage at about 7 weeks in 2009.  She is on prenatal MVI.  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  She has been off OCP for several years using rhythm and condoms for birth control.   Review of Systems  Constitutional: Positive for fatigue. Negative for activity change, appetite change and unexpected weight change.  HENT: Negative.   Respiratory: Negative.   Cardiovascular: Negative.   Gastrointestinal: Negative.   Genitourinary: Positive for vaginal bleeding.  Musculoskeletal: Negative.   Skin: Negative.   Neurological: Negative.   Psychiatric/Behavioral: Negative.        Objective:   Physical Exam  Constitutional: She is oriented to person, place, and time. She appears well-developed and well-nourished. No distress.  Cardiovascular: Normal heart sounds.   Pulmonary/Chest: Effort normal and breath sounds normal.  Abdominal: Soft. She exhibits no distension. There is no tenderness. There is no rebound and no guarding.  Genitourinary:  Light brown vaginal spotting.  Cervix is closed.  Neurological: She is alert and oriented to person, place, and time.  Psychiatric: She has a normal mood and affect. Her behavior is normal. Judgment and thought content normal.  Anxious.   UPT: positive    Assessment:     Amenorrhea with + UPT Light vaginal spotting with wiping X 4 days History of miscarriage at 7 weeks in 2009    Plan:      Will get serum HCG and follow

## 2015-06-21 ENCOUNTER — Telehealth: Payer: Self-pay | Admitting: Nurse Practitioner

## 2015-06-21 DIAGNOSIS — N912 Amenorrhea, unspecified: Secondary | ICD-10-CM

## 2015-06-21 LAB — HCG, QUANTITATIVE, PREGNANCY: HCG, BETA CHAIN, QUANT, S: 136.3 m[IU]/mL — AB

## 2015-06-21 NOTE — Patient Instructions (Signed)
Will follow with labs

## 2015-06-21 NOTE — Telephone Encounter (Signed)
Patient calling for recent results. °

## 2015-06-21 NOTE — Telephone Encounter (Signed)
Patient is notified of her labs test results and the serum test is low at 136.3.  She will need a repeat serum HCG tomorrow - best time for her to come is at 2:00 prior to going to work at 3:00 pm.  She did have a miscarriage at 7 weeks in 2009.  She has no pain but still has light brown discharge.

## 2015-06-21 NOTE — Telephone Encounter (Signed)
Routing to Kem Boroughs, FNP for review of results from 11/28. Hcg level was 136.3. Per OV note patient had been spotting for the last 3 days. Please advise.

## 2015-06-22 ENCOUNTER — Other Ambulatory Visit (INDEPENDENT_AMBULATORY_CARE_PROVIDER_SITE_OTHER): Payer: 59

## 2015-06-22 ENCOUNTER — Other Ambulatory Visit: Payer: 59

## 2015-06-22 DIAGNOSIS — N912 Amenorrhea, unspecified: Secondary | ICD-10-CM

## 2015-06-22 NOTE — Progress Notes (Signed)
Encounter reviewed by Dr. Jatziry Wechter Amundson C. Silva.  

## 2015-06-23 ENCOUNTER — Telehealth: Payer: Self-pay | Admitting: Nurse Practitioner

## 2015-06-23 DIAGNOSIS — O039 Complete or unspecified spontaneous abortion without complication: Secondary | ICD-10-CM

## 2015-06-23 LAB — HCG, QUANTITATIVE, PREGNANCY: HCG, BETA CHAIN, QUANT, S: 55.2 m[IU]/mL — AB

## 2015-06-23 NOTE — Telephone Encounter (Signed)
She should call if her cramping is severe or she is saturating through one pad/hour. Lets have her get another BhcG tomorrow (send it stat so the results are back tomorrow)

## 2015-06-23 NOTE — Telephone Encounter (Signed)
Spoke with patient. Advised of message as seen below from [place]. Patient is agreeable and verbalizes understanding. Patient is available after 3 pm tomorrow. Lab appointment scheduled for 06/24/2015 at 4:10 pm. Agreeable to date and time. Order placed for stat hcg quant.  Routing to provider for final review. Patient agreeable to disposition. Will close encounter.

## 2015-06-23 NOTE — Telephone Encounter (Signed)
Spoke with patient. Patient states "I am almost sure I am having a miscarriage. When I was seen in the office my levels were low and Patty was going to recheck them. Now I am having really bad cramping and basically bleeding like my cycle." Advised patient her hcg quant went from 136.3 to 55.2. Patient is agreeable. Advised I will have a MD review as Kem Boroughs, FNP is out of the office today and return call with further recommendations. Patient is agreeable. Patient is asking if she can take Ibuprofen now to help with the cramping she is experiencing. Advised okay to take Ibuprofen at this time to help with cramping and bleeding.   Routing to [place] for review as Kem Boroughs, FNP is out of the office today.

## 2015-06-23 NOTE — Telephone Encounter (Signed)
Patient thinks she may be having a miscarriage.

## 2015-06-24 ENCOUNTER — Telehealth: Payer: Self-pay

## 2015-06-24 ENCOUNTER — Other Ambulatory Visit: Payer: 59

## 2015-06-24 ENCOUNTER — Other Ambulatory Visit (INDEPENDENT_AMBULATORY_CARE_PROVIDER_SITE_OTHER): Payer: 59

## 2015-06-24 DIAGNOSIS — O039 Complete or unspecified spontaneous abortion without complication: Secondary | ICD-10-CM

## 2015-06-24 LAB — HCG, QUANTITATIVE, PREGNANCY: HCG, BETA CHAIN, QUANT, S: 29 m[IU]/mL — AB

## 2015-06-24 NOTE — Telephone Encounter (Signed)
Agree with recommendations.  Thank you.  Ok to close encounter.

## 2015-06-24 NOTE — Telephone Encounter (Signed)
Spoke with patient. Advised HCG quant has decreased to 70. Spoke with Dr.Miller who recommends patient be seen next Thursday for repeat hcg quant to ensure levels are dropping appropriately. Patient denies any cramping or pain. States she is bleeding "like my normal period." Advised if bleeding increases to having to change pad every hour due to soaking through for 2 hours or develops increased cramping or pain will need to be seen immediately with [facility] over the weekend. If this occurs next week will need to notify our office to be seen. Patient is agreeable. Follow up lab appointment scheduled for 06/30/2015 at 10 am. Order placed.  Dr.Miller, do you agree with recommendations?

## 2015-06-24 NOTE — Telephone Encounter (Signed)
Spoke with patient regarding lab appointment for today. Advised we want to be able to have the lab result back today for review before the weekend. Patient states she is getting off work early today and can move her appointment up. Lab appointment moved up to today at 1:30 pm so order can be ran stat. Patient is agreeable.  Routing to Dr.Jertson as Kem Boroughs, FNP is out of the office for final review. Patient agreeable to disposition. Will close encounter.

## 2015-06-24 NOTE — Telephone Encounter (Signed)
Dr.Miller, patient was seen in office today for stat quant hcg level due to bleeding with pregnancy. Results returned at 29 was 55.2 two days ago and 136.3 four days ago. Please review and advise as this is a Kem Boroughs, FNP patient and Dr.Jertson was reviewing, but is out of the office this afternoon.

## 2015-06-30 ENCOUNTER — Other Ambulatory Visit (INDEPENDENT_AMBULATORY_CARE_PROVIDER_SITE_OTHER): Payer: 59

## 2015-06-30 DIAGNOSIS — O039 Complete or unspecified spontaneous abortion without complication: Secondary | ICD-10-CM

## 2015-06-30 LAB — HCG, QUANTITATIVE, PREGNANCY: HCG, BETA CHAIN, QUANT, S: 4.4 m[IU]/mL

## 2015-07-01 ENCOUNTER — Telehealth: Payer: Self-pay | Admitting: Nurse Practitioner

## 2015-07-01 NOTE — Telephone Encounter (Signed)
    Patient calling for lab results 

## 2015-07-01 NOTE — Telephone Encounter (Signed)
Routing to Kem Boroughs, FNP for review and advised of HCG results from 06/30/2015.

## 2015-07-01 NOTE — Telephone Encounter (Signed)
Spoke with patient. Advised of results as seen below from [place]. Patient is agreeable and verbalizes understanding.  Notes Recorded by Megan Salon, MD on 07/01/2015 at 12:43 PM Please inform HCG 4.4 and less than 5 is normal. No additional blood work needed. She can start trying again for pregnancy after one normal cycle. Ok to come in for consult if has any questions.   Routing to provider for final review. Patient agreeable to disposition. Will close encounter.

## 2016-03-23 DIAGNOSIS — D271 Benign neoplasm of left ovary: Secondary | ICD-10-CM

## 2016-03-23 HISTORY — DX: Benign neoplasm of left ovary: D27.1

## 2016-05-01 ENCOUNTER — Encounter: Payer: Self-pay | Admitting: Nurse Practitioner

## 2016-05-01 ENCOUNTER — Ambulatory Visit (INDEPENDENT_AMBULATORY_CARE_PROVIDER_SITE_OTHER): Payer: 59 | Admitting: Nurse Practitioner

## 2016-05-01 VITALS — BP 108/66 | HR 84 | Ht 64.5 in | Wt 133.0 lb

## 2016-05-01 DIAGNOSIS — Z86018 Personal history of other benign neoplasm: Secondary | ICD-10-CM

## 2016-05-01 DIAGNOSIS — D271 Benign neoplasm of left ovary: Secondary | ICD-10-CM | POA: Diagnosis not present

## 2016-05-01 DIAGNOSIS — Z0000 Encounter for general adult medical examination without abnormal findings: Secondary | ICD-10-CM | POA: Diagnosis not present

## 2016-05-01 DIAGNOSIS — Z01419 Encounter for gynecological examination (general) (routine) without abnormal findings: Secondary | ICD-10-CM | POA: Diagnosis not present

## 2016-05-01 NOTE — Progress Notes (Signed)
Patient ID: Casey Clarke, female   DOB: 1981/06/04, 35 y.o.   MRN: [id number]  35 y.o. G27P0020 Married  Caucasian Fe here for annual exam.  She saw Dr. Julien Girt after she had last miscarriage in November 2016.  She was sent to Dr. Carmela Rima for infertility issues.  She found out she was ovulating correctly per labs.  PUS showed a cyst left OV.  She then had a follow up US and found this to be a dermoid cyst along with a fibroid.  She had HSG this Saturday and was reportable normal.   She is scheduled for surgery 06/19/16 for myomectomy and removal of dermoid cyst.  Menses still regular with 5 days of flow.  Bleed X 3 days and then spotting.  Husband is having a sperm testing done.  She has a history of abnormal and hopes this pap to be normal.  Patient's last menstrual period was 04/19/2016 (exact date).          Sexually active: Yes.    The current method of family planning is none.    Exercising: No.  The patient does not participate in regular exercise at present. Smoker:  no  Health Maintenance: Pap: 04/29/15, ASCUS with neg HR HPV  05/26/14, Colpo, LGSIL, ECC negative  04/06/14, Pap, Negative with + HR HPV  04/10/13, Colpo,Atypia consistent with HPV related change  03/20/13, Pap, ASCUS with pos HR HPV, pos 16/18;  TDaP: 06/22/12 HIV: 2011 Labs: Kerin Perna   reports that she has never smoked. She has never used smokeless tobacco. She reports that she drinks alcohol. She reports that she does not use drugs.  Past Medical History:  Diagnosis Date  . Abnormal Pap smear of cervix 2014, 03/2014   ASCUS, pos HR HPV, 18/45; 2015 LGSIL, + HR HPV  . 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 AUGMENTATION MAMMAPLASTY Bilateral 01/17/10   implants  . COLPOSCOPY  2014, 2015   2015 LGSIL, neg ECC  . DILATION AND CURETTAGE OF UTERUS  12/30/07   S/P SAB    Current Outpatient Prescriptions  Medication Sig Dispense Refill  . Multiple Vitamins-Minerals  (MULTIVITAMIN PO) Take by mouth.     No current facility-administered medications for this visit.     Family History  Problem Relation Age of Onset  . Thyroid disease Mother   . Hypertension Maternal Grandmother   . Thyroid disease Maternal Grandmother   . Heart attack Maternal Grandfather   . Cancer Maternal Grandfather     lymph nodes  . Cancer Paternal Grandmother     stomach  . Heart disease Paternal Grandfather     Psychologist, forensic  . Hypotension Paternal Grandfather     ROS:  Pertinent items are noted in HPI.  Otherwise, a comprehensive ROS was negative.  Exam:   BP 108/66 (BP Location: Right Arm, Patient Position: Sitting, Cuff Size: Normal)   Pulse 84   Ht 5' 4.5" (1.638 m)   Wt 133 lb (60.3 kg)   LMP 04/19/2016 (Exact Date)   BMI 22.48 kg/m  Height: 5' 4.5" (163.8 cm) Ht Readings from Last 3 Encounters:  05/01/16 5' 4.5" (1.638 m)  04/29/15 5' 4.75" (1.645 m)  02/21/15 5' 4.75" (1.645 m)    General appearance: alert, cooperative and appears stated age Head: Normocephalic, without obvious abnormality, atraumatic Neck: no adenopathy, supple, symmetrical, trachea midline and thyroid normal to inspection and palpation Lungs: clear to auscultation bilaterally Breasts: normal appearance, no masses  or tenderness, positive findings: implants bilaterally Heart: regular rate and rhythm Abdomen: soft, non-tender; no masses,  no organomegaly Extremities: extremities normal, atraumatic, no cyanosis or edema Skin: Skin color, texture, turgor normal. No rashes or lesions Lymph nodes: Cervical, supraclavicular, and axillary nodes normal. No abnormal inguinal nodes palpated Neurologic: Grossly normal   Pelvic: External genitalia:  no lesions              Urethra:  normal appearing urethra with no masses, tenderness or lesions              Bartholin's and Skene's: normal                 Vagina: normal appearing vagina with normal color and discharge, no lesions               Cervix: anteverted              Pap taken: Yes.   Bimanual Exam:  Uterus:  normal size, contour, position, consistency, mobility, non-tender              Adnexa: no mass, fullness, tenderness               Rectovaginal: Confirms               Anus:  normal sphincter tone, no lesions  Chaperone present: yes  A:  Well Woman with normal exam  Miscarriage X 2   History of uterine fibroid and dermoid left OV cyst - surgery is 06/19/2016  Followed by Dr Carmela Rima  S/P bilateral breast implants  History of ASCUS with + HR HPV, Colpo without biopsy 03/08/10, Colpo biopsy 03/2013 with HPV changes, Colpo biopsy 11/15 LGSIL, pap 10/16 ASCUS - HR HPV    P:   Reviewed health and wellness pertinent to exam  Pap smear as above  Call pt with results  Continue on Prenatal MVI  Counseled on breast self exam, adequate intake of calcium and vitamin D, diet and exercise return annually or prn  An After Visit Summary was printed and given to the patient.

## 2016-05-01 NOTE — Progress Notes (Signed)
Encounter reviewed by Dr. Brook Amundson C. Silva.  

## 2016-05-01 NOTE — Patient Instructions (Signed)

## 2016-05-03 LAB — IPS PAP TEST WITH HPV

## 2016-05-04 ENCOUNTER — Telehealth: Payer: Self-pay

## 2016-05-04 NOTE — Telephone Encounter (Signed)
Spoke with patient. Advised of message as seen below from [place]. Patient verbalizes understanding and has reviewed message on MyChart as well. Patient is not currently on any form of birth control. Advised she will need to contact the office with her first day of her next menstrual cycle to schedule her colposcopy appointment. Patient is agreeable.

## 2016-05-04 NOTE — Telephone Encounter (Signed)
-----   Message from Nunzio Cobbs, MD sent at 05/03/2016  4:28 PM EDT ----- Results to patient through My Chart. Please schedule colposcopy with me.  Patient has a history of LGSIL.   Hello Neida,   This is Dr. Quincy Simmonds stepping in for [facility] while she is out of the office.   Your pap is showing atypical cells again this year.  The high risk HPV testing is negative.   You need a colposcopy again.   I will have the nurse from the office call to discuss this further and get this scheduled.   It would be best to do this prior to your upcoming surgery.   Thank you,   Josefa Half, MD

## 2016-05-10 NOTE — Telephone Encounter (Signed)
Recall placed for November 2017.

## 2016-05-10 NOTE — Telephone Encounter (Signed)
Please place patient in recall for November 2017 so we are sure she follow up for colpo.

## 2016-05-21 ENCOUNTER — Telehealth: Payer: Self-pay | Admitting: Nurse Practitioner

## 2016-05-21 ENCOUNTER — Other Ambulatory Visit: Payer: Self-pay | Admitting: *Deleted

## 2016-05-21 DIAGNOSIS — R87610 Atypical squamous cells of undetermined significance on cytologic smear of cervix (ASC-US): Secondary | ICD-10-CM

## 2016-05-21 NOTE — Telephone Encounter (Signed)
Patient said she started her period Friday and that she was to call to schedule colposcopy.

## 2016-05-21 NOTE — Telephone Encounter (Signed)
Spoke with patient. Patient would like to schedule colpo with Dr. Quincy Simmonds. LMP 05/18/16. Patient states menses usually last 5 days. Patient scheduled 05/25/16 at 10:00am with Dr. Quincy Simmonds. Advised to take Motrin 800 mg with food and water one hour before procedure. Call office with any questions/concerns. Patient agreeable to date and time.  Routing to provider for final review. Patient is agreeable to disposition. Will close encounter.  Cc: Kem Boroughs, NP, Theresia Lo   Notes Recorded by Jasmine Awe, RN on 05/04/2016 at 8:37 AM EDT Spoke with patient. Advised of message as seen below from [place]. Patient verbalizes understanding and has reviewed message on MyChart as well. Patient is not currently on any form of birth control. Advised she will need to contact the office with her first day of her next menstrual cycle to schedule her colposcopy appointment. Patient is agreeable. ------

## 2016-05-25 ENCOUNTER — Encounter: Payer: Self-pay | Admitting: Obstetrics and Gynecology

## 2016-05-25 ENCOUNTER — Ambulatory Visit (INDEPENDENT_AMBULATORY_CARE_PROVIDER_SITE_OTHER): Payer: 59 | Admitting: Obstetrics and Gynecology

## 2016-05-25 VITALS — BP 124/72 | HR 88 | Ht 64.5 in | Wt 134.0 lb

## 2016-05-25 DIAGNOSIS — R87610 Atypical squamous cells of undetermined significance on cytologic smear of cervix (ASC-US): Secondary | ICD-10-CM

## 2016-05-25 DIAGNOSIS — Z01812 Encounter for preprocedural laboratory examination: Secondary | ICD-10-CM | POA: Diagnosis not present

## 2016-05-25 LAB — POCT URINE PREGNANCY: Preg Test, Ur: NEGATIVE

## 2016-05-25 NOTE — Patient Instructions (Signed)

## 2016-05-25 NOTE — Progress Notes (Signed)
Subjective:     Patient ID: Casey Clarke, female   DOB: 1981/03/10, 35 y.o.   MRN: [id number]  HPI  Patient here today for colposcopy. Pap smear 05-01-16 Ascus:Neg HR HPV. Pap history as follows:  04/29/15, ASCUS with neg HR HPV             05/26/14, Colpo, LGSIL, ECC negative             04/06/14, Pap, Negative with + HR HPV             04/10/13, Colpo, Atypia consistent with HPV              Related changes             03/20/13, Pap, ASCUS with pos HR HPV, pos 16/18;   Review of Systems  LMP: 05-18-16 Contraception:None (trying for preg) UPT: Negative  Having surgery 06/19/16 for myomectomy and removal of dermoid cyst. Dr. Kerin Perna.  Had HSG per patient and fibroid not impinging on the cavity.  Hx 2 SABs.     Objective:   Physical Exam  Genitourinary:    Colposcopy procedure.  Consent for procedure.  Speculum placed.  3% acetic acid used.  Colposcopy satisfactory.  White light and green filter light used. Acetowhite change at 12:00.  Biopsy of exocervix at 12:00.  To GPA path.  ECC performed.  To GPA path. Monsel's placed.  Minimal EBL.  No complications.    Assessment:     ASCUS pap and negative HR HPV x 2. This is following prior LGSIL in 2015.  Hx dermoid cyst and uterine fibroid. Hx SAB x 2.     Plan:     Discussion of abnormal paps, colposcopy, HR HPV. Follow up biopsy results.  I anticipate cotesting in one year.  Ok to proceed forward with surgery with Dr. Kerin Perna.   __10_____ minutes face to face time of which over 50% was spent in counseling.   After visit summary to patient.

## 2016-05-29 ENCOUNTER — Telehealth: Payer: Self-pay

## 2016-05-29 NOTE — Telephone Encounter (Signed)
-----   Message from Nunzio Cobbs, MD sent at 05/29/2016  1:58 PM EST ----- Results to patient through My Chart. Please call patient in follow up to be sure she understands results and the plan.  Needs recall - 08.   Hi Casey Clarke,   Your biopsy results returned suggesting HPV changes.  There is no precancer or cancer noted.  No treatment is indicated.  I am recommending that you have a pap and HR HPV testing in one year.  The nurse will call to be sure you receive this result and plan.  Best wishes for a good recovery from upcoming surgery!  Josefa Half, MD  Cc- Marisa Sprinkles

## 2016-05-29 NOTE — Telephone Encounter (Signed)
Spoke with patient. Advised of message as seen below from [place]. Patient is agreeable and verbalizes understanding. 08 recall placed.  Routing to provider for final review. Patient agreeable to disposition. Will close encounter.

## 2016-06-08 ENCOUNTER — Encounter (HOSPITAL_BASED_OUTPATIENT_CLINIC_OR_DEPARTMENT_OTHER): Payer: Self-pay | Admitting: *Deleted

## 2016-06-08 NOTE — Progress Notes (Signed)
NPO AFTER MN.  ARRIVE AT 0700.  NEEDS HG AND URINE PREG.

## 2016-06-11 ENCOUNTER — Telehealth: Payer: Self-pay

## 2016-06-11 NOTE — Telephone Encounter (Signed)
Encounter opened in error. Will close encounter.

## 2016-06-19 ENCOUNTER — Ambulatory Visit (HOSPITAL_BASED_OUTPATIENT_CLINIC_OR_DEPARTMENT_OTHER): Payer: 59 | Admitting: Anesthesiology

## 2016-06-19 ENCOUNTER — Ambulatory Visit (HOSPITAL_BASED_OUTPATIENT_CLINIC_OR_DEPARTMENT_OTHER)
Admission: RE | Admit: 2016-06-19 | Discharge: 2016-06-19 | Disposition: A | Discharge status: Home / Self Care | Payer: 59 | Source: Ambulatory Visit | Attending: Obstetrics and Gynecology | Admitting: Obstetrics and Gynecology

## 2016-06-19 ENCOUNTER — Encounter (HOSPITAL_BASED_OUTPATIENT_CLINIC_OR_DEPARTMENT_OTHER)
Admission: RE | Disposition: A | Discharge status: Home / Self Care | Payer: Self-pay | Source: Ambulatory Visit | Attending: Obstetrics and Gynecology

## 2016-06-19 ENCOUNTER — Encounter (HOSPITAL_BASED_OUTPATIENT_CLINIC_OR_DEPARTMENT_OTHER): Payer: Self-pay | Admitting: *Deleted

## 2016-06-19 DIAGNOSIS — Z885 Allergy status to narcotic agent status: Secondary | ICD-10-CM | POA: Diagnosis not present

## 2016-06-19 DIAGNOSIS — N979 Female infertility, unspecified: Secondary | ICD-10-CM | POA: Diagnosis not present

## 2016-06-19 DIAGNOSIS — N920 Excessive and frequent menstruation with regular cycle: Secondary | ICD-10-CM | POA: Insufficient documentation

## 2016-06-19 DIAGNOSIS — D252 Subserosal leiomyoma of uterus: Secondary | ICD-10-CM | POA: Insufficient documentation

## 2016-06-19 DIAGNOSIS — D271 Benign neoplasm of left ovary: Secondary | ICD-10-CM | POA: Diagnosis not present

## 2016-06-19 DIAGNOSIS — Z8719 Personal history of other diseases of the digestive system: Secondary | ICD-10-CM | POA: Diagnosis not present

## 2016-06-19 DIAGNOSIS — D251 Intramural leiomyoma of uterus: Secondary | ICD-10-CM | POA: Diagnosis not present

## 2016-06-19 HISTORY — PX: CHROMOPERTUBATION: SHX6288

## 2016-06-19 HISTORY — DX: Nocturia: R35.1

## 2016-06-19 HISTORY — DX: Personal history of peptic ulcer disease: Z87.11

## 2016-06-19 HISTORY — DX: Female infertility, unspecified: N97.9

## 2016-06-19 HISTORY — DX: Personal history of other specified conditions: Z87.898

## 2016-06-19 HISTORY — DX: Nausea with vomiting, unspecified: Z98.890

## 2016-06-19 HISTORY — DX: Recurrent pregnancy loss: N96

## 2016-06-19 HISTORY — DX: Migraine, unspecified, not intractable, without status migrainosus: G43.909

## 2016-06-19 HISTORY — DX: Personal history of other diseases of the digestive system: Z87.19

## 2016-06-19 HISTORY — PX: LAPAROSCOPIC GELPORT ASSISTED MYOMECTOMY: SHX6549

## 2016-06-19 HISTORY — PX: OVARIAN CYST REMOVAL: SHX89

## 2016-06-19 HISTORY — DX: Nausea with vomiting, unspecified: R11.2

## 2016-06-19 LAB — POCT HEMOGLOBIN-HEMACUE: Hemoglobin: 14.3 g/dL (ref 12.0–15.0)

## 2016-06-19 LAB — POCT PREGNANCY, URINE: PREG TEST UR: NEGATIVE

## 2016-06-19 SURGERY — LAPAROSCOPIC GELPORT ASSISTED MYOMECTOMY
Anesthesia: General | Site: Uterus

## 2016-06-19 MED ORDER — LACTATED RINGERS IV SOLN
INTRAVENOUS | Status: DC
  Administered 2016-06-19 (×2): via INTRAVENOUS
  Filled 2016-06-19: qty 1000

## 2016-06-19 MED ORDER — PROPOFOL 10 MG/ML IV BOLUS
INTRAVENOUS | Status: DC | PRN
  Administered 2016-06-19: 180 mg via INTRAVENOUS

## 2016-06-19 MED ORDER — ONDANSETRON HCL 4 MG/2ML IJ SOLN
INTRAMUSCULAR | Status: AC
  Filled 2016-06-19: qty 2

## 2016-06-19 MED ORDER — VASOPRESSIN 20 UNIT/ML IV SOLN
INTRAVENOUS | Status: DC | PRN
  Administered 2016-06-19: 60 mL via INTRAMUSCULAR

## 2016-06-19 MED ORDER — HYDROMORPHONE HCL 1 MG/ML IJ SOLN
0.2500 mg | INTRAMUSCULAR | Status: DC | PRN
  Administered 2016-06-19 (×2): 0.5 mg via INTRAVENOUS
  Filled 2016-06-19: qty 0.5

## 2016-06-19 MED ORDER — CLINDAMYCIN PHOSPHATE 900 MG/50ML IV SOLN
INTRAVENOUS | Status: AC
  Filled 2016-06-19: qty 50

## 2016-06-19 MED ORDER — OXYCODONE-ACETAMINOPHEN 7.5-325 MG PO TABS: 1.0000 | ORAL_TABLET | ORAL | 0 refills | Status: DC | PRN

## 2016-06-19 MED ORDER — OXYCODONE HCL 5 MG PO TABS
5.0000 mg | ORAL_TABLET | Freq: Once | ORAL | Status: AC
Start: 2016-06-19 — End: 2016-06-19
  Administered 2016-06-19: 5 mg via ORAL
  Filled 2016-06-19: qty 1

## 2016-06-19 MED ORDER — SUGAMMADEX SODIUM 200 MG/2ML IV SOLN
INTRAVENOUS | Status: DC | PRN
  Administered 2016-06-19: 200 mg via INTRAVENOUS

## 2016-06-19 MED ORDER — SODIUM CHLORIDE 0.9 % IJ SOLN
INTRAMUSCULAR | Status: DC | PRN
  Administered 2016-06-19: 60 mL

## 2016-06-19 MED ORDER — SCOPOLAMINE 1 MG/3DAYS TD PT72
MEDICATED_PATCH | TRANSDERMAL | Status: AC
  Filled 2016-06-19: qty 1

## 2016-06-19 MED ORDER — MIDAZOLAM HCL 5 MG/5ML IJ SOLN
INTRAMUSCULAR | Status: DC | PRN
  Administered 2016-06-19: 2 mg via INTRAVENOUS

## 2016-06-19 MED ORDER — KETOROLAC TROMETHAMINE 30 MG/ML IJ SOLN
INTRAMUSCULAR | Status: AC
  Filled 2016-06-19: qty 1

## 2016-06-19 MED ORDER — OXYCODONE HCL 5 MG PO TABS
ORAL_TABLET | ORAL | Status: AC
  Filled 2016-06-19: qty 1

## 2016-06-19 MED ORDER — CIPROFLOXACIN IN D5W 400 MG/200ML IV SOLN
400.0000 mg | INTRAVENOUS | Status: DC
  Filled 2016-06-19: qty 200

## 2016-06-19 MED ORDER — MIDAZOLAM HCL 2 MG/2ML IJ SOLN
INTRAMUSCULAR | Status: AC
  Filled 2016-06-19: qty 2

## 2016-06-19 MED ORDER — KETOROLAC TROMETHAMINE 30 MG/ML IJ SOLN
INTRAMUSCULAR | Status: DC | PRN
  Administered 2016-06-19: 30 mg via INTRAVENOUS

## 2016-06-19 MED ORDER — PROPOFOL 10 MG/ML IV BOLUS
INTRAVENOUS | Status: AC
  Filled 2016-06-19: qty 20

## 2016-06-19 MED ORDER — CEFAZOLIN SODIUM-DEXTROSE 2-4 GM/100ML-% IV SOLN
2.0000 g | INTRAVENOUS | Status: AC
  Administered 2016-06-19: 2 g via INTRAVENOUS
  Filled 2016-06-19: qty 100

## 2016-06-19 MED ORDER — CLINDAMYCIN PHOSPHATE 900 MG/50ML IV SOLN
900.0000 mg | INTRAVENOUS | Status: DC
  Filled 2016-06-19: qty 50

## 2016-06-19 MED ORDER — HYDROMORPHONE HCL 1 MG/ML IJ SOLN
INTRAMUSCULAR | Status: AC
  Filled 2016-06-19: qty 1

## 2016-06-19 MED ORDER — ONDANSETRON HCL 4 MG PO TABS: 4.0000 mg | ORAL_TABLET | Freq: Three times a day (TID) | ORAL | 0 refills | Status: DC | PRN

## 2016-06-19 MED ORDER — FENTANYL CITRATE (PF) 100 MCG/2ML IJ SOLN
INTRAMUSCULAR | Status: AC
  Filled 2016-06-19: qty 2

## 2016-06-19 MED ORDER — BUPIVACAINE-EPINEPHRINE 0.5% -1:200000 IJ SOLN
INTRAMUSCULAR | Status: DC | PRN
  Administered 2016-06-19: 10 mL

## 2016-06-19 MED ORDER — LIDOCAINE 2% (20 MG/ML) 5 ML SYRINGE
INTRAMUSCULAR | Status: DC | PRN
  Administered 2016-06-19: 80 mg via INTRAVENOUS

## 2016-06-19 MED ORDER — CEFAZOLIN SODIUM-DEXTROSE 2-4 GM/100ML-% IV SOLN
INTRAVENOUS | Status: AC
  Filled 2016-06-19: qty 100

## 2016-06-19 MED ORDER — ROCURONIUM BROMIDE 50 MG/5ML IV SOSY
PREFILLED_SYRINGE | INTRAVENOUS | Status: AC
  Filled 2016-06-19: qty 5

## 2016-06-19 MED ORDER — SCOPOLAMINE 1 MG/3DAYS TD PT72
1.0000 | MEDICATED_PATCH | TRANSDERMAL | Status: DC
  Administered 2016-06-19: 1.5 mg via TRANSDERMAL
  Filled 2016-06-19: qty 1

## 2016-06-19 MED ORDER — SUCCINYLCHOLINE CHLORIDE 200 MG/10ML IV SOSY
PREFILLED_SYRINGE | INTRAVENOUS | Status: AC
  Filled 2016-06-19: qty 10

## 2016-06-19 MED ORDER — ONDANSETRON HCL 4 MG/2ML IJ SOLN
INTRAMUSCULAR | Status: DC | PRN
  Administered 2016-06-19: 4 mg via INTRAVENOUS

## 2016-06-19 MED ORDER — DEXAMETHASONE SODIUM PHOSPHATE 4 MG/ML IJ SOLN
INTRAMUSCULAR | Status: DC | PRN
  Administered 2016-06-19: 10 mg via INTRAVENOUS

## 2016-06-19 MED ORDER — LACTATED RINGERS IR SOLN
Status: DC | PRN
  Administered 2016-06-19: 3000 mL

## 2016-06-19 MED ORDER — LIDOCAINE 2% (20 MG/ML) 5 ML SYRINGE
INTRAMUSCULAR | Status: AC
  Filled 2016-06-19: qty 5

## 2016-06-19 MED ORDER — METHYLENE BLUE 0.5 % INJ SOLN
INTRAVENOUS | Status: DC | PRN
  Administered 2016-06-19: 1 mL

## 2016-06-19 MED ORDER — DEXAMETHASONE SODIUM PHOSPHATE 10 MG/ML IJ SOLN
INTRAMUSCULAR | Status: AC
  Filled 2016-06-19: qty 1

## 2016-06-19 MED ORDER — FENTANYL CITRATE (PF) 100 MCG/2ML IJ SOLN
INTRAMUSCULAR | Status: DC | PRN
  Administered 2016-06-19: 25 ug via INTRAVENOUS
  Administered 2016-06-19: 50 ug via INTRAVENOUS
  Administered 2016-06-19: 25 ug via INTRAVENOUS

## 2016-06-19 MED ORDER — CIPROFLOXACIN IN D5W 400 MG/200ML IV SOLN
INTRAVENOUS | Status: AC
  Filled 2016-06-19: qty 200

## 2016-06-19 MED ORDER — ROCURONIUM BROMIDE 10 MG/ML (PF) SYRINGE
PREFILLED_SYRINGE | INTRAVENOUS | Status: DC | PRN
  Administered 2016-06-19: 10 mg via INTRAVENOUS
  Administered 2016-06-19: 40 mg via INTRAVENOUS

## 2016-06-19 MED FILL — OXYCODONE-APAP 7.5/325MG: 7.5-325 | 4 days supply | Qty: 20 | Fill #0

## 2016-06-19 SURGICAL SUPPLY — 86 items
APPLICATOR COTTON TIP 6IN STRL (MISCELLANEOUS) ×5 IMPLANT
BAG SPEC RTRVL LRG 6X4 10 (ENDOMECHANICALS)
BLADE SURG 10 STRL SS (BLADE) ×5 IMPLANT
BLADE SURG 11 STRL SS (BLADE) ×5 IMPLANT
BRR ADH 6X5 SEPRAFILM 1 SHT (MISCELLANEOUS) ×6
CLEANER CAUTERY TIP 5X5 PAD (MISCELLANEOUS) IMPLANT
CORDS BIPOLAR (ELECTRODE) IMPLANT
COVER MAYO STAND STRL (DRAPES) ×5 IMPLANT
DEVICE TROCAR PUNCTURE CLOSURE (ENDOMECHANICALS) IMPLANT
DRAPE UNDERBUTTOCKS STRL (DRAPE) ×5 IMPLANT
DRSG OPSITE POSTOP 3X4 (GAUZE/BANDAGES/DRESSINGS) ×2 IMPLANT
DRSG TEGADERM 4X4.75 (GAUZE/BANDAGES/DRESSINGS) IMPLANT
DRSG TELFA 3X8 NADH (GAUZE/BANDAGES/DRESSINGS) IMPLANT
ELECT BLADE 6.5 .24CM SHAFT (ELECTRODE) IMPLANT
ELECT NDL TIP 2.8 STRL (NEEDLE) IMPLANT
ELECT NEEDLE TIP 2.8 STRL (NEEDLE) IMPLANT
ELECT REM PT RETURN 9FT ADLT (ELECTROSURGICAL) ×5
ELECTRODE REM PT RTRN 9FT ADLT (ELECTROSURGICAL) ×3 IMPLANT
GLOVE BIO SURGEON STRL SZ 6.5 (GLOVE) ×1 IMPLANT
GLOVE BIO SURGEON STRL SZ7 (GLOVE) ×2 IMPLANT
GLOVE BIO SURGEON STRL SZ8 (GLOVE) ×5 IMPLANT
GLOVE BIO SURGEONS STRL SZ 6.5 (GLOVE) ×1
GLOVE BIOGEL PI IND STRL 8.5 (GLOVE) ×3 IMPLANT
GLOVE BIOGEL PI INDICATOR 8.5 (GLOVE) ×2
GLOVE INDICATOR 6.5 STRL GRN (GLOVE) ×2 IMPLANT
GLOVE INDICATOR 7.0 STRL GRN (GLOVE) ×2 IMPLANT
GLOVE INDICATOR 7.5 STRL GRN (GLOVE) ×2 IMPLANT
GOWN STRL REUS W/ TWL LRG LVL3 (GOWN DISPOSABLE) ×6 IMPLANT
GOWN STRL REUS W/TWL LRG LVL3 (GOWN DISPOSABLE) ×14 IMPLANT
GOWN STRL REUS W/TWL XL LVL3 (GOWN DISPOSABLE) ×2 IMPLANT
HOLDER FOLEY CATH W/STRAP (MISCELLANEOUS) ×2 IMPLANT
KIT ROOM TURNOVER WOR (KITS) ×5 IMPLANT
LEGGING LITHOTOMY PAIR STRL (DRAPES) ×5 IMPLANT
LIQUID BAND (GAUZE/BANDAGES/DRESSINGS) ×2 IMPLANT
MANIPULATOR UTERINE 4.5 ZUMI (MISCELLANEOUS) ×5 IMPLANT
NDL HYPO 25X1 1.5 SAFETY (NEEDLE) ×3 IMPLANT
NDL INSUFFLATION 14GA 120MM (NEEDLE) ×3 IMPLANT
NDL SAFETY ECLIPSE 18X1.5 (NEEDLE) ×3 IMPLANT
NEEDLE HYPO 18GX1.5 SHARP (NEEDLE) ×10
NEEDLE HYPO 22GX1.5 SAFETY (NEEDLE) ×5 IMPLANT
NEEDLE HYPO 25X1 1.5 SAFETY (NEEDLE) ×5 IMPLANT
NEEDLE INSUFFLATION 14GA 120MM (NEEDLE) ×5 IMPLANT
NS IRRIG 500ML POUR BTL (IV SOLUTION) ×5 IMPLANT
PACK BASIN DAY SURGERY FS (CUSTOM PROCEDURE TRAY) ×5 IMPLANT
PACK LAPAROSCOPY II (CUSTOM PROCEDURE TRAY) ×5 IMPLANT
PAD CLEANER CAUTERY TIP 5X5 (MISCELLANEOUS)
PAD DRESSING TELFA 3X8 NADH (GAUZE/BANDAGES/DRESSINGS) IMPLANT
PAD ION RIGHT ARM DISP (MISCELLANEOUS) ×5 IMPLANT
PAD OB MATERNITY 4.3X12.25 (PERSONAL CARE ITEMS) ×5 IMPLANT
PENCIL BUTTON HOLSTER BLD 10FT (ELECTRODE) IMPLANT
POUCH SPECIMEN RETRIEVAL 10MM (ENDOMECHANICALS) IMPLANT
SCALPEL HARMONIC ACE (MISCELLANEOUS) IMPLANT
SEPRAFILM MEMBRANE 5X6 (MISCELLANEOUS) ×4 IMPLANT
SET IRRIG TUBING LAPAROSCOPIC (IRRIGATION / IRRIGATOR) ×5 IMPLANT
SOLUTION ANTI FOG 6CC (MISCELLANEOUS) ×5 IMPLANT
SPONGE LAP 18X18 X RAY DECT (DISPOSABLE) ×2 IMPLANT
SUT MNCRL AB 4-0 PS2 18 (SUTURE) ×5 IMPLANT
SUT PROLENE 0 CT 1 30 (SUTURE) IMPLANT
SUT VIC AB 0 CT1 36 (SUTURE) IMPLANT
SUT VIC AB 2-0 CT1 27 (SUTURE) ×20
SUT VIC AB 2-0 CT1 TAPERPNT 27 (SUTURE) ×12 IMPLANT
SUT VIC AB 2-0 CT2 27 (SUTURE) IMPLANT
SUT VIC AB 2-0 UR6 27 (SUTURE) IMPLANT
SUT VIC AB 4-0 SH 27 (SUTURE) ×10
SUT VIC AB 4-0 SH 27XANBCTRL (SUTURE) ×6 IMPLANT
SUT VICRYL 0 TIES 12 18 (SUTURE) IMPLANT
SUT VICRYL 6 0 RB 1 (SUTURE) ×2 IMPLANT
SYR 20CC LL (SYRINGE) IMPLANT
SYR 30ML LL (SYRINGE) ×2 IMPLANT
SYR 3ML 23GX1 SAFETY (SYRINGE) ×2 IMPLANT
SYR 5ML LL (SYRINGE) ×5 IMPLANT
SYR CONTROL 10ML LL (SYRINGE) ×11 IMPLANT
SYRINGE 10CC LL (SYRINGE) ×5 IMPLANT
SYRINGE 60CC LL (MISCELLANEOUS) ×2 IMPLANT
SYS LAPSCP GELPORT 120MM (MISCELLANEOUS) ×5
SYSTEM LAPSCP GELPORT 120MM (MISCELLANEOUS) IMPLANT
TOWEL OR 17X24 6PK STRL BLUE (TOWEL DISPOSABLE) ×10 IMPLANT
TRAY DSU PREP LF (CUSTOM PROCEDURE TRAY) ×5 IMPLANT
TRAY FOLEY CATH SILVER 14FR (SET/KITS/TRAYS/PACK) ×5 IMPLANT
TROCAR OPTI TIP 5M 100M (ENDOMECHANICALS) ×5 IMPLANT
TROCAR XCEL DIL TIP R 11M (ENDOMECHANICALS) IMPLANT
TUBE CONNECTING 12'X1/4 (SUCTIONS) ×1
TUBE CONNECTING 12X1/4 (SUCTIONS) ×1 IMPLANT
TUBING INSUFFLATION 10FT LAP (TUBING) ×10 IMPLANT
WARMER LAPAROSCOPE (MISCELLANEOUS) ×5 IMPLANT
WATER STERILE IRR 500ML POUR (IV SOLUTION) ×5 IMPLANT

## 2016-06-19 NOTE — Op Note (Signed)
Operative Note  Preoperative diagnosis: Uterine fibroid, left ovarian dermoid, menorrhagia, infertility  Postoperative diagnosis: Uterine fibroid, left ovarian dermoid, menorrhagia, infertility  Procedure: Laparoscopy, GelPort assisted myomectomy and left ovarian cystectomy, chromotubation  Anesthesia: Gen. endotracheal  Complications: None  Estimated blood loss: 50 mL  Specimens: Uterine myoma and left ovarian cyst to pathology  Findings: On examination under anesthesia, external genitalia, Bartholin's, Skene's, and urethra were normal. The vagina was normal. The cervix was nulliparous and appeared grossly normal. The uterus was 8 week size, firm and mobile with irregularities caused by posterior myoma. It sounded to 8 cm. there was a 4-5 cm left ovarian smooth cystic mass palpable. There were no right adnexal masses. There was no posterior fornix nodularity. On laparoscopy, upper abdomen, liver surface and diaphragm surfaces were normal. Gallbladder was normal. The appendix was appeared normal.  The uterus contained a 5 x 5 cm posterior subserosal/intramural myoma. The left tube appeared normal. The left ovary contained a 7 x 7 cm dermoid cyst which was removed in its entirety. The right tube and ovary appeared normal. During chromotubation the left tube past the dye. The right tube did not fill with the methylene blue but this may have been a technical problem. The pelvic peritoneum looked mostly normal.    Description of the procedure: The patient was placed in dorsal supine position and general endotracheal anesthesia was given. 2 g of cefazolin were given intravenously for prophylaxis. Patient was placed in lithotomy position. She was prepped and draped in sterile manner.  A Foley catheter was placed into the bladder. A ZUMI catheter was placed into the uterine cavity. This was connected to a syringe containing diluted methylene blue solution which was used to define the  endometrium during the myomectomy. The uterus sounded to 8 cm. The surgeon was regloved and a surgical field was created on the abdomen.  After preemptive anesthesia of all surgical sites with 0.5% bupivacaine, a 5 mm intraumbilical skin incision was made and a Verress needle was inserted. Its correct location was confirmed. A pneumoperitoneum was created with carbon dioxide.  5 mm laparoscope with a 30 lens was inserted and video laparoscopy was started. A right lower quadrant  5 mm incisions was made and ancillary trochar was placed under direct visualization. Above findings were noted.  A dilute solution of vasopressin (0.4 units per mL) was injected into the myometrium overlying the fundal myoma, until the myometrium blanched. A needle electrode with 78 W of cutting current  was used to make a transverse incision on the myometrium overlying the  posterior subserosal/intramural 5 cm myoma. The myoma was grasped with tenaculum and dissection was started.  We then made a 3 cm transverse suprapubic incision to insert a GelPort. After dissection of the anatomic layers, the peritoneal cavity was entered. A GelPort was placed and the rest of the case was performed either using this port as a laparoscopic port or as a minilaparotomy port.  The rest of the myoma had to be in situ morcellated to eventually take it out of this 3 cm incision. This was carried out by blunt and sharp dissection and in situ morcellation was performed with #10 blade. The endometrial cavity was not entered. Chromotubation was performed. Endometrial integrity was confirmed and left ovarian patency was confirmed but the right fallopian tube did not fill with the methylene blue but this may have been a technical problem. The myoma defect was closed in 3 layers: The first layer was a deep myometrial suture  of 2-0 Vicryl continuous interlocking suture, the second layer was a superficial myometrial layer of 2-0 Vicryl continuous suture. A  4-0 Monocryl continuous suture was placed on the serosa and the most superficial myometrium.  Attention was then turned to the left ovary with the largest cyst. The utero-ovarian ligament was grasped and the enlarged ovary was stabilized using a Babcock clamp. A needle tip electrode was used to make a anterior posterior incision on the medial aspect of the cyst. The thinned ovarian cortex was grasped with Allis clamps and blunt and sharp dissection was carried out until the large cyst was almost completely freed up. At this point we had to perform a 3 mm incision on the cyst wall and mostly straw-colored thin fluid was aspirated with suction. This decompressed the cyst enough to bring it out of the 3 cm abdominal incision and the rest of the cyst was detached from the ovary, just as the keratinaceous contents on the lateral aspect of the cyst started oozing out. Hemostasis was achieved on the remaining left ovary. The left ovary was reconstructed using 6-0 Vicryl in subcortical stitches in a continuous manner. The ovary was replaced in the pelvis. The suprapubic fascial incision was closed with 2-0 Vicryl continuous suture. Subcutaneous tissue was irrigated and aspirated good hemostasis was achieved. The abdomen and the pelvis was carefully inspected under laparoscopic visualization and the pelvis was copiously irrigated and aspirated. A slurry of 2 sheets of Seprafilm in 60 mL of normal saline was injected as an adhesion barrier into the pelvis. The gas was allowed to escape. The instrument and the lap pad count were correct. The trochars were removed. The skin incisions were approximated with 4-0 Monocryl in subcuticular sutures, including the 3 cm skin incision that belonged to the [place] site.  The patient tolerated the procedure well and was transferred to recovery room in satisfactory condition.  SPECIAL NOTE: Because of the extent of the myometrial incision during the uterine myomectomy, it is  recommended that this patient deliver by a cesarean section with her future pregnancies.  Governor Specking, MD

## 2016-06-19 NOTE — Transfer of Care (Signed)
Immediate Anesthesia Transfer of Care Note  Patient: Casey Clarke  Procedure(s) Performed: Procedure(s) (LRB): LAPAROSCOPIC GELPORT ASSISTED MYOMECTOMY (N/A) LEFT OVARIAN CYSTECTOMY (Left) CHROMOPERTUBATION (N/A)  Patient Location: PACU  Anesthesia Type: General  Level of Consciousness: awake, oriented, sedated and patient cooperative  Airway & Oxygen Therapy: Patient Spontanous Breathing and Patient connected to face mask oxygen  Post-op Assessment: Report given to PACU RN and Post -op Vital signs reviewed and stable  Post vital signs: Reviewed and stable  Complications: No apparent anesthesia complications Last Vitals:  Vitals:   06/19/16 0706 06/19/16 1345  BP: 138/85   Pulse: 97   Resp: 16   Temp: 36.8 C 36.8 C

## 2016-06-19 NOTE — Op Note (Signed)
Operative Note  Preoperative diagnosis: Uterine fibroid, left ovarian dermoid, menorrhagia, infertility  Postoperative diagnosis: Uterine fibroid, left ovarian dermoid, menorrhagia, infertility  Procedure: Laparoscopy, GelPort assisted myomectomy and left ovarian cystectomy, chromotubation  Anesthesia: Gen. endotracheal  Complications: None  Estimated blood loss: 50 mL  Specimens: Uterine myoma and left ovarian cyst to pathology  Findings: On examination under anesthesia, external genitalia, Bartholin's, Skene's, and urethra were normal. The vagina was normal. The cervix was nulliparous and appeared grossly normal. The uterus was 8 week size, firm and mobile with irregularities caused by posterior myoma. It sounded to 8 cm. there was a 4-5 cm left ovarian smooth cystic mass palpable. There were no right adnexal masses. There was no posterior fornix nodularity. On laparoscopy, upper abdomen, liver surface and diaphragm surfaces were normal. Gallbladder was normal. The appendix was appeared normal.  The uterus contained a 5 x 5 cm posterior subserosal/intramural myoma. The left tube appeared normal. The left ovary contained a 7 x 7 cm dermoid cyst which was removed in its entirety. The right tube and ovary appeared normal. During chromotubation the left tube past the dye. The right tube did not fill with the methylene blue but this may have been a technical problem. The pelvic peritoneum looked mostly normal.    Description of the procedure: The patient was placed in dorsal supine position and general endotracheal anesthesia was given. 2 g of cefazolin were given intravenously for prophylaxis. Patient was placed in lithotomy position. She was prepped and draped in sterile manner.  A Foley catheter was placed into the bladder. A ZUMI catheter was placed into the uterine cavity. This was connected to a syringe containing diluted methylene blue solution which was used to define the  endometrium during the myomectomy. The uterus sounded to 8 cm. The surgeon was regloved and a surgical field was created on the abdomen.  After preemptive anesthesia of all surgical sites with 0.5% bupivacaine, a 5 mm intraumbilical skin incision was made and a Verress needle was inserted. Its correct location was confirmed. A pneumoperitoneum was created with carbon dioxide.  5 mm laparoscope with a 30 lens was inserted and video laparoscopy was started. A right lower quadrant  5 mm incisions was made and ancillary trochar was placed under direct visualization. Above findings were noted.  A dilute solution of vasopressin (0.4 units per mL) was injected into the myometrium overlying the fundal myoma, until the myometrium blanched. A needle electrode with 72 W of cutting current  was used to make a transverse incision on the myometrium overlying the  posterior subserosal/intramural 5 cm myoma. The myoma was grasped with tenaculum and dissection was started.  We then made a 3 cm transverse suprapubic incision to insert a GelPort. After dissection of the anatomic layers, the peritoneal cavity was entered. A GelPort was placed and the rest of the case was performed either using this port as a laparoscopic port or as a minilaparotomy port.  The rest of the myoma had to be in situ morcellated to eventually take it out of this 3 cm incision. This was carried out by blunt and sharp dissection and in situ morcellation was performed with #10 blade. The endometrial cavity was not entered. Chromotubation was performed. Endometrial integrity was confirmed and left ovarian patency was confirmed but the right fallopian tube did not fill with the methylene blue but this may have been a technical problem. The myoma defect was closed in 3 layers: The first layer was a deep myometrial suture  of 2-0 Vicryl continuous interlocking suture, the second layer was a superficial myometrial layer of 2-0 Vicryl continuous suture. A  4-0 Monocryl continuous suture was placed on the serosa and the most superficial myometrium.  Attention was then turned to the left ovary with the largest cyst. The utero-ovarian ligament was grasped and the enlarged ovary was stabilized using a Babcock clamp. A needle tip electrode was used to make a anterior posterior incision on the medial aspect of the cyst. The thinned ovarian cortex was grasped with Allis clamps and blunt and sharp dissection was carried out until the large cyst was almost completely freed up. At this point we had to perform a 3 mm incision on the cyst wall and mostly straw-colored thin fluid was aspirated with suction. This decompressed the cyst enough to bring it out of the 3 cm abdominal incision and the rest of the cyst was detached from the ovary, just as the keratinaceous contents on the lateral aspect of the cyst started oozing out. Hemostasis was achieved on the remaining left ovary. The left ovary was reconstructed using 6-0 Vicryl in subcortical stitches in a continuous manner. The ovary was replaced in the pelvis. The suprapubic fascial incision was closed with 2-0 Vicryl continuous suture. Subcutaneous tissue was irrigated and aspirated good hemostasis was achieved. The abdomen and the pelvis was carefully inspected under laparoscopic visualization and the pelvis was copiously irrigated and aspirated. A slurry of 2 sheets of Seprafilm in 60 mL of normal saline was injected as an adhesion barrier into the pelvis. The gas was allowed to escape. The instrument and the lap pad count were correct. The trochars were removed. The skin incisions were approximated with 4-0 Monocryl in subcuticular sutures, including the 3 cm skin incision that belonged to the [place] site.  The patient tolerated the procedure well and was transferred to recovery room in satisfactory condition.  SPECIAL NOTE: Because of the extent of the myometrial incision during the uterine myomectomy, it is  recommended that this patient deliver by a cesarean section with her future pregnancies.  Governor Specking, MD

## 2016-06-19 NOTE — Anesthesia Procedure Notes (Signed)
Procedure Name: Intubation Date/Time: 06/19/2016 11:40 AM Performed by: Denna Haggard D Pre-anesthesia Checklist: Patient identified, Emergency Drugs available, Suction available and Patient being monitored Patient Re-evaluated:Patient Re-evaluated prior to inductionOxygen Delivery Method: Circle system utilized Preoxygenation: Pre-oxygenation with 100% oxygen Intubation Type: IV induction Ventilation: Mask ventilation without difficulty Laryngoscope Size: Mac and 3 Grade View: Grade I Tube type: Oral Tube size: 7.0 mm Number of attempts: 1 Airway Equipment and Method: Stylet and Oral airway Placement Confirmation: ETT inserted through vocal cords under direct vision,  positive ETCO2 and breath sounds checked- equal and bilateral Secured at: 22 cm Tube secured with: Tape Dental Injury: Teeth and Oropharynx as per pre-operative assessment

## 2016-06-19 NOTE — Anesthesia Preprocedure Evaluation (Addendum)
Anesthesia Evaluation  Patient identified by MRN, date of birth, ID band Patient awake    Reviewed: Allergy & Precautions, H&P , NPO status , Patient's Chart, lab work & pertinent test results  History of Anesthesia Complications (+) PONV  Airway Mallampati: I  TM Distance: >3 FB Neck ROM: Full    Dental no notable dental hx. (+) Teeth Intact, Dental Advisory Given   Pulmonary neg pulmonary ROS,    Pulmonary exam normal breath sounds clear to auscultation       Cardiovascular negative cardio ROS   Rhythm:Regular Rate:Normal     Neuro/Psych  Headaches, negative psych ROS   GI/Hepatic negative GI ROS, Neg liver ROS,   Endo/Other  negative endocrine ROS  Renal/GU negative Renal ROS  negative genitourinary   Musculoskeletal   Abdominal   Peds  Hematology negative hematology ROS (+)   Anesthesia Other Findings   Reproductive/Obstetrics negative OB ROS                            Anesthesia Physical Anesthesia Plan  ASA: II  Anesthesia Plan: General   Post-op Pain Management:    Induction: Intravenous  Airway Management Planned: Oral ETT  Additional Equipment:   Intra-op Plan:   Post-operative Plan: Extubation in OR  Informed Consent: I have reviewed the patients History and Physical, chart, labs and discussed the procedure including the risks, benefits and alternatives for the proposed anesthesia with the patient or authorized representative who has indicated his/her understanding and acceptance.   Dental advisory given  Plan Discussed with: CRNA  Anesthesia Plan Comments:         Anesthesia Quick Evaluation

## 2016-06-19 NOTE — Discharge Instructions (Signed)
HOME CARE INSTRUCTIONS - LAPAROSCOPY ° °Wound Care: The bandaids or dressing which are placed over the skin openings may be removed the day after surgery. The incision should be kept clean and dry. The stitches do not need to be removed. Should the incision become sore, red, and swollen after the first week, check with your doctor. ° °Personal Hygiene: Shower the day after your procedure. Always wipe from front to back after elimination.  ° °Activity: Do not drive or operate any equipment today. The effects of the anesthesia are still present and drowsiness may result. Rest today, not necessarily flat bed rest, just take it easy. You may resume your normal activity in one to three days or as instructed by your physician. ° °Sexual Activity: You resume sexual activity as indicated by your physician_________. °If your laparoscopy was for a sterilization ( tubes tied ), continue current method of birth control until after your next period or ask for specific instructions from your doctor. ° °Diet: Eat a light diet as desired this evening. You may resume a regular diet tomorrow. ° °Return to Work: Two to three days or as indicated by your doctor. ° °Expectations °After Surgery: Your surgery will cause vaginal drainage or spotting which may continue for 2-3 days. Mild abdominal discomfort or tenderness is not unusual and some shoulder pain may also be noted which can be relieved by lying flat in pain. ° °Call Your Doctor °If these Occur:  Persistent or heavy bleeding at incision site °      Redness or swelling around incision °      Elevation of temperature greater than 100 degrees F ° ° °Post Anesthesia Home Care Instructions ° °Activity: °Get plenty of rest for the remainder of the day. A responsible adult should stay with you for 24 hours following the procedure.  °For the next 24 hours, DO NOT: °-Drive a car °-Operate machinery °-Drink alcoholic beverages °-Take any medication unless instructed by your  physician °-Make any legal decisions or sign important papers. ° °Meals: °Start with liquid foods such as gelatin or soup. Progress to regular foods as tolerated. Avoid greasy, spicy, heavy foods. If nausea and/or vomiting occur, drink only clear liquids until the nausea and/or vomiting subsides. Call your physician if vomiting continues. ° °Special Instructions/Symptoms: °Your throat may feel dry or sore from the anesthesia or the breathing tube placed in your throat during surgery. If this causes discomfort, gargle with warm salt water. The discomfort should disappear within 24 hours. ° °If you had a scopolamine patch placed behind your ear for the management of post- operative nausea and/or vomiting: ° °1. The medication in the patch is effective for 72 hours, after which it should be removed.  Wrap patch in a tissue and discard in the trash. Wash hands thoroughly with soap and water. °2. You may remove the patch earlier than 72 hours if you experience unpleasant side effects which may include dry mouth, dizziness or visual disturbances. °3. Avoid touching the patch. Wash your hands with soap and water after contact with the patch. °  ° °

## 2016-06-19 NOTE — Anesthesia Postprocedure Evaluation (Signed)
Anesthesia Post Note  Patient: Casey Clarke  Procedure(s) Performed: Procedure(s) (LRB): LAPAROSCOPIC GELPORT ASSISTED MYOMECTOMY (N/A) LEFT OVARIAN CYSTECTOMY (Left) CHROMOPERTUBATION (N/A)  Patient location during evaluation: PACU Anesthesia Type: General Level of consciousness: awake and alert, oriented and patient cooperative Pain management: pain level controlled Vital Signs Assessment: post-procedure vital signs reviewed and stable Respiratory status: spontaneous breathing, nonlabored ventilation and respiratory function stable Cardiovascular status: blood pressure returned to baseline and stable Postop Assessment: no signs of nausea or vomiting Anesthetic complications: no    Last Vitals:  Vitals:   06/19/16 1515 06/19/16 1520  BP:    Pulse: 80 90  Resp: 12 13  Temp:      Last Pain:  Vitals:   06/19/16 1520  TempSrc:   PainSc: 5                  Arisbeth Purrington,E. Fionnuala Hemmerich

## 2016-06-19 NOTE — H&P (Signed)
Casey Clarke is a 35 y.o. 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  She was diagnosed with fibroids.  She has been having monthly periods but with heavy flow and prolonged duration.  Patient would like to preserve her childbearing potential.  Pertinent Gynecological History: Menses: flow is excessive with use of 3 pads or tampons on heaviest days Bleeding: dysfunctional uterine bleeding Contraception: none DES exposure: denies Blood transfusions: none Sexually transmitted diseases: no past history Previous GYN Procedures:   Last mammogram: normal Last pap: normal   Menstrual History: Menarche age: 46 No LMP recorded.    Past Medical History:  Diagnosis Date  . Abnormal Pap smear of cervix 2014, 03/2014   ASCUS, pos HR HPV, 18/45; 2015 LGSIL, + HR HPV  . Dermoid cyst of ovary, left 03/2016  . Fibroid   . Frequency of urination   . History of gastric ulcer    remote hx - resolved  . History of palpitations    hypersenitive to codeine and per pt sometimes tachy  . History of recurrent miscarriages   . 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 Infertility, female   . Migraine   . Nocturia   . PONV (postoperative nausea and vomiting)                     Past Surgical History:  Procedure Laterality Date  . AUGMENTATION MAMMAPLASTY Bilateral 01/17/10   implants  . COLPOSCOPY  2014, 2015   2015 LGSIL, neg ECC  . DILATION AND EVACUATION  12/30/2007   missed ab  . TRANSTHORACIC ECHOCARDIOGRAM  01/08/2014   ef 56%/  mild to moderater TR/  trivial PR             Family History  Problem Relation Age of Onset  . Thyroid disease Mother   . Hypertension Maternal Grandmother   . Thyroid disease Maternal Grandmother   . Heart attack Maternal Grandfather   . Cancer Maternal Grandfather     lymph nodes  . Cancer Paternal Grandmother     stomach  . Heart disease Paternal Grandfather     Psychologist, forensic  . Hypotension Paternal  Grandfather    No hereditary disease.  No cancer of breast, ovary, uterus. No cutaneous leiomyomatosis or renal cell carcinoma.  Social History   Social History  . Marital status: Married    Spouse name: N/A  . Number of children: 0  . Years of education: N/A   Occupational History  . Not on file.   Social History Main Topics  . Smoking status: Never Smoker  . Smokeless tobacco: Never Used  . Alcohol use 0.0 oz/week     Comment: social  . Drug use: No  . Sexual activity: Yes    Partners: Male    Birth control/ protection: None   Other Topics Concern  . Not on file   Social History Narrative  . No narrative on file    Allergies  Allergen Reactions  . Codeine Other (See Comments)    "palpitations and heart races"    No current facility-administered medications on file prior to encounter.    No current outpatient prescriptions on file prior to encounter.     Review of Systems  Constitutional: Negative.   HENT: Negative.   Eyes: Negative.   Respiratory: Negative.   Cardiovascular: Negative.   Gastrointestinal: Negative.   Genitourinary: Negative.   Musculoskeletal: Negative.   Skin: Negative.  Neurological: Negative.   Endo/Heme/Allergies: Negative.   Psychiatric/Behavioral: Negative.      Physical Exam  BP 138/85   Pulse 97   Temp 98.3 F (36.8 C) (Oral)   Resp 16   Ht 5' 4.5" (1.638 m)   Wt 59.2 kg (130 lb 8 oz)   LMP 05/19/2016 (Approximate)   SpO2 100%   BMI 22.05 kg/m  Constitutional: She is oriented to person, place, and time. She appears well-developed and well-nourished.  HENT:  Head: Normocephalic and atraumatic.  Nose: Nose normal.  Mouth/Throat: Oropharynx is clear and moist. No oropharyngeal exudate.  Eyes: Conjunctivae normal and EOM are normal. Pupils are equal, round, and reactive to light. No scleral icterus.  Neck: Normal range of motion. Neck supple. No tracheal deviation present. No thyromegaly present.  Cardiovascular:  Normal rate.   Respiratory: Effort normal and breath sounds normal.  GI: Soft. Bowel sounds are normal. She exhibits no distension and no mass. There is no tenderness.  Lymphadenopathy:    She has no cervical adenopathy.  Neurological: She is alert and oriented to person, place, and time. She has normal reflexes.  Skin: Skin is warm.  Psychiatric: She has a normal mood and affect. Her behavior is normal. Judgment and thought content normal.       Assessment/Plan:  Intramural and subserosal uterine myoma, causing menorrhagia and pressure sensation and left ovarian 4 cm dermoid. Preoperative for L/S myomectomy and left cystectomy Benefits and risks of robotic myomectomy were discussed with the patient  again.  Bowel prep instructions were given.  All of patient's questions were answered.  She verbalized understanding.  She knows that she may need a cesarean delivery for future pregnancies, and that it is recommended she does not conceive for 2-3 months for uterus to heal.

## 2016-06-20 ENCOUNTER — Encounter (HOSPITAL_BASED_OUTPATIENT_CLINIC_OR_DEPARTMENT_OTHER): Payer: Self-pay | Admitting: Obstetrics and Gynecology

## 2016-07-13 ENCOUNTER — Ambulatory Visit (INDEPENDENT_AMBULATORY_CARE_PROVIDER_SITE_OTHER): Payer: 59 | Admitting: Nurse Practitioner

## 2016-07-13 ENCOUNTER — Ambulatory Visit: Payer: 59 | Admitting: Nurse Practitioner

## 2016-07-13 ENCOUNTER — Encounter: Payer: Self-pay | Admitting: Nurse Practitioner

## 2016-07-13 VITALS — BP 130/80 | HR 100 | Resp 20 | Ht 64.5 in | Wt 131.2 lb

## 2016-07-13 DIAGNOSIS — R3915 Urgency of urination: Secondary | ICD-10-CM | POA: Diagnosis not present

## 2016-07-13 DIAGNOSIS — N760 Acute vaginitis: Secondary | ICD-10-CM

## 2016-07-13 LAB — POCT URINALYSIS DIPSTICK
BILIRUBIN UA: NEGATIVE
GLUCOSE UA: NEGATIVE
KETONES UA: NEGATIVE
NITRITE UA: NEGATIVE
PH UA: 5
Protein, UA: NEGATIVE
Urobilinogen, UA: NEGATIVE

## 2016-07-13 MED ORDER — PHENAZOPYRIDINE HCL 200 MG PO TABS: 200.0000 mg | ORAL_TABLET | Freq: Three times a day (TID) | ORAL | 0 refills | Status: DC | PRN

## 2016-07-13 MED ORDER — NITROFURANTOIN MONOHYD MACRO 100 MG PO CAPS: ORAL_CAPSULE | ORAL | 0 refills | Status: DC

## 2016-07-13 NOTE — Progress Notes (Signed)
35 y.o.Married Caucasian female [id number] here with complaint of UTI symptoms since Tuesday. 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 Kerin Perna on 06/19/16. She did have a catheter post surgery.  Patient complaining of:  urinary urgency. Patient denies fever, chills, nausea or back pain. No new personal products. Patient feels fine to sexual activity. Denies vaginal symptoms.    Contraception is none.  No change in partner.  Patient has adequate water intake   O: Healthy female WDWN Affect: Normal, orientation x 3 Skin : warm and dry CVAT: negative bilateral Abdomen: slight tender suprapubic tenderness, healing surgical scar on right abdomen  Pelvic exam: External genital area: normal, no lesions Bladder,Urethra tender, Urethral meatus: normal Vagina: thin yellow vaginal discharge Affirm is done   POCT:  1+ RBC, small leuk's  A:  R/O UTI  R/O vaginitis  P: Reviewed findings of UTI and need for treatment. Rx:  Macrobid !00 mg BID,  may use post coital prn in the future  RX Pyridium 200 mg tid pen [id number] micro, culture, and Affirm Reviewed warning signs and symptoms of UTI and need to advise if occurring. Encouraged to limit soda, tea, and coffee   RV prn

## 2016-07-13 NOTE — Patient Instructions (Signed)
Urinary Tract Infection, Adult Introduction A urinary tract infection (UTI) is an infection of any part of the urinary tract. The urinary tract includes the:  Kidneys.  Ureters.  Bladder.  Urethra. These organs make, store, and get rid of pee (urine) in the body. Follow these instructions at home:  Take over-the-counter and prescription medicines only as told by your doctor.  If you were prescribed an antibiotic medicine, take it as told by your doctor. Do not stop taking the antibiotic even if you start to feel better.  Avoid the following drinks:  Alcohol.  Caffeine.  Tea.  Carbonated drinks.  Drink enough fluid to keep your pee clear or pale yellow.  Keep all follow-up visits as told by your doctor. This is important.  Make sure to:  Empty your bladder often and completely. Do not to hold pee for long periods of time.  Empty your bladder before and after sex.  Wipe from front to back after a bowel movement if you are female. Use each tissue one time when you wipe. Contact a doctor if:  You have back pain.  You have a fever.  You feel sick to your stomach (nauseous).  You throw up (vomit).  Your symptoms do not get better after 3 days.  Your symptoms go away and then come back. Get help right away if:  You have very bad back pain.  You have very bad lower belly (abdominal) pain.  You are throwing up and cannot keep down any medicines or water. This information is not intended to replace advice given to you by your health care provider. Make sure you discuss any questions you have with your health care provider. Document Released: 12/26/2007 Document Revised: 12/15/2015 Document Reviewed: 05/30/2015  2017 Elsevier  

## 2016-07-14 ENCOUNTER — Emergency Department (HOSPITAL_COMMUNITY): Payer: 59

## 2016-07-14 ENCOUNTER — Encounter (HOSPITAL_COMMUNITY): Payer: Self-pay

## 2016-07-14 ENCOUNTER — Emergency Department (HOSPITAL_COMMUNITY)
Admission: EM | Admit: 2016-07-14 | Discharge: 2016-07-14 | Disposition: A | Discharge status: Home / Self Care | Payer: 59 | Attending: Emergency Medicine | Admitting: Emergency Medicine

## 2016-07-14 DIAGNOSIS — R109 Unspecified abdominal pain: Secondary | ICD-10-CM | POA: Insufficient documentation

## 2016-07-14 DIAGNOSIS — Z79899 Other long term (current) drug therapy: Secondary | ICD-10-CM | POA: Diagnosis not present

## 2016-07-14 DIAGNOSIS — N390 Urinary tract infection, site not specified: Secondary | ICD-10-CM | POA: Insufficient documentation

## 2016-07-14 LAB — URINALYSIS, ROUTINE W REFLEX MICROSCOPIC
Bilirubin Urine: NEGATIVE
GLUCOSE, UA: NEGATIVE mg/dL
Hgb urine dipstick: NEGATIVE
Ketones, ur: NEGATIVE mg/dL
Leukocytes, UA: NEGATIVE
Nitrite: POSITIVE — AB
PROTEIN: NEGATIVE mg/dL
RBC / HPF: NONE SEEN RBC/hpf (ref 0–5)
SQUAMOUS EPITHELIAL / LPF: NONE SEEN
Specific Gravity, Urine: 1.001 — ABNORMAL LOW (ref 1.005–1.030)
WBC, UA: NONE SEEN WBC/hpf (ref 0–5)
pH: 6 (ref 5.0–8.0)

## 2016-07-14 LAB — URINALYSIS, MICROSCOPIC ONLY
BACTERIA UA: NONE SEEN [HPF]
Casts: NONE SEEN [LPF]
Crystals: NONE SEEN [HPF]
WBC, UA: NONE SEEN WBC/HPF (ref <=5)
YEAST: NONE SEEN [HPF]

## 2016-07-14 LAB — CBC
HCT: 37.5 % (ref 36.0–46.0)
Hemoglobin: 12.8 g/dL (ref 12.0–15.0)
MCH: 29.9 pg (ref 26.0–34.0)
MCHC: 34.1 g/dL (ref 30.0–36.0)
MCV: 87.6 fL (ref 78.0–100.0)
PLATELETS: 377 10*3/uL (ref 150–400)
RBC: 4.28 MIL/uL (ref 3.87–5.11)
RDW: 13.2 % (ref 11.5–15.5)
WBC: 6.5 10*3/uL (ref 4.0–10.5)

## 2016-07-14 LAB — BASIC METABOLIC PANEL
Anion gap: 8 (ref 5–15)
BUN: 10 mg/dL (ref 6–20)
CALCIUM: 9.1 mg/dL (ref 8.9–10.3)
CHLORIDE: 106 mmol/L (ref 101–111)
CO2: 25 mmol/L (ref 22–32)
Creatinine, Ser: 0.74 mg/dL (ref 0.44–1.00)
GFR calc Af Amer: >60 mL/min (ref >60)
GFR calc non Af Amer: >60 mL/min (ref >60)
GLUCOSE: 120 mg/dL — AB (ref 65–99)
Potassium: 3.3 mmol/L — ABNORMAL LOW (ref 3.5–5.1)
Sodium: 139 mmol/L (ref 135–145)

## 2016-07-14 LAB — WET PREP BY MOLECULAR PROBE
Candida species: NEGATIVE
GARDNERELLA VAGINALIS: NEGATIVE
Trichomonas vaginosis: NEGATIVE

## 2016-07-14 LAB — POC URINE PREG, ED: PREG TEST UR: NEGATIVE

## 2016-07-14 MED ORDER — FENTANYL CITRATE (PF) 100 MCG/2ML IJ SOLN
50.0000 ug | INTRAMUSCULAR | Status: DC | PRN
  Filled 2016-07-14: qty 2

## 2016-07-14 MED ORDER — IBUPROFEN 200 MG PO TABS
400.0000 mg | ORAL_TABLET | Freq: Once | ORAL | Status: AC | PRN
  Administered 2016-07-14: 400 mg via ORAL
  Filled 2016-07-14: qty 2

## 2016-07-14 NOTE — ED Provider Notes (Signed)
[facility] DEPT Provider Note   CSN: [id number] Arrival date & time: 07/14/16  [id number]     History   Chief Complaint Chief Complaint  Patient presents with  . Flank Pain    HPI Casey Clarke is a 35 y.o. female.  35 year old female recently diagnosed with UTI presents with acute onset of left-sided flank pain characterized as colicky. Denies any prior history of renal colic. She is currently taking nitrofurantoin for UTI. She currently denies any pain at this time. She denies any fever or chills. Denies any vaginal itching or discharge. Denies any hematuria. Took ibuprofen this morning which did improve her symptoms. She is currently asymptomatic      Past Medical History:  Diagnosis Date  . Abnormal Pap smear of cervix 2014, 03/2014   ASCUS, pos HR HPV, 18/45; 2015 LGSIL, + HR HPV  . Dermoid cyst of ovary, left 03/2016  . Fibroid   . Frequency of urination   . History of gastric ulcer    remote hx - resolved  . History of palpitations    hypersenitive to codeine and per pt sometimes tachy  . History of recurrent miscarriages   . 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 Infertility, female   . Migraine   . Nocturia   . PONV (postoperative nausea and vomiting)     Patient Active Problem List   Diagnosis Date Noted  . Chest pain 03/11/2014  . Tricuspid regurgitation 03/11/2014  . Palpitations 03/11/2014    Past Surgical History:  Procedure Laterality Date  . AUGMENTATION MAMMAPLASTY Bilateral 01/17/10   implants  . CHROMOPERTUBATION N/A 06/19/2016   Procedure: CHROMOPERTUBATION;  Surgeon: Governor Specking, MD;  Location: [facility];  Service: Gynecology;  Laterality: N/A;  . COLPOSCOPY  2014, 2015   2015 LGSIL, neg ECC  . DILATION AND EVACUATION  12/30/2007   missed ab  . LAPAROSCOPIC GELPORT ASSISTED MYOMECTOMY N/A 06/19/2016   Procedure: LAPAROSCOPIC GELPORT ASSISTED MYOMECTOMY;  Surgeon: Governor Specking, MD;  Location: [place];   Service: Gynecology;  Laterality: N/A;  . OVARIAN CYST REMOVAL Left 06/19/2016   Procedure: LEFT OVARIAN CYSTECTOMY;  Surgeon: Governor Specking, MD;  Location: [place];  Service: Gynecology;  Laterality: Left;  . TRANSTHORACIC ECHOCARDIOGRAM  01/08/2014   ef 56%/  mild to moderater TR/  trivial PR    OB History    Gravida Para Term Preterm AB Living   2 0 0 0 2 0   SAB TAB Ectopic Multiple Live Births   2 0 0 0 0       Home Medications    Prior to Admission medications   Medication Sig Start Date End Date Taking? Authorizing Provider  nitrofurantoin, macrocrystal-monohydrate, (MACROBID) 100 MG capsule 1 capsule BID for 7 days and then PC prn Patient taking differently: Take 100 mg by mouth 2 (two) times daily. 1 capsule BID for 7 days and then PC prn 07/13/16  Yes Kem Boroughs, FNP  Nutritional Supplements (JUICE PLUS FIBRE PO) Take 2 tablets by mouth daily.   Yes Historical Provider, MD  phenazopyridine (PYRIDIUM) 200 MG tablet Take 1 tablet (200 mg total) by mouth 3 (three) times daily as needed for pain. 07/13/16  Yes Kem Boroughs, FNP    Family History Family History  Problem Relation Age of Onset  . Thyroid disease Mother   . Hypertension Maternal Grandmother   . Thyroid disease Maternal Grandmother   . Heart attack Maternal Grandfather   . Cancer Maternal Grandfather  lymph nodes  . Cancer Paternal Grandmother     stomach  . Heart disease Paternal Grandfather     Psychologist, forensic  . Hypotension Paternal Grandfather     Social History Social History  Substance Use Topics  . Smoking status: Never Smoker  . Smokeless tobacco: Never Used  . Alcohol use 0.0 oz/week     Comment: social     Allergies   Codeine   Review of Systems Review of Systems  All other systems reviewed and are negative.    Physical Exam Updated Vital Signs BP 160/98   Pulse 103   Temp 98.1 F (36.7 C) (Oral)   Resp 17   LMP 06/15/2016   SpO2 97%    Physical Exam  Constitutional: She is oriented to person, place, and time. She appears well-developed and well-nourished.  Non-toxic appearance. No distress.  HENT:  Head: Normocephalic and atraumatic.  Eyes: Conjunctivae, EOM and lids are normal. Pupils are equal, round, and reactive to light.  Neck: Normal range of motion. Neck supple. No tracheal deviation present. No thyroid mass present.  Cardiovascular: Normal rate, regular rhythm and normal heart sounds.  Exam reveals no gallop.   No murmur heard. Pulmonary/Chest: Effort normal and breath sounds normal. No stridor. No respiratory distress. She has no decreased breath sounds. She has no wheezes. She has no rhonchi. She has no rales.  Abdominal: Soft. Normal appearance and bowel sounds are normal. She exhibits no distension. There is no tenderness. There is no rebound and no CVA tenderness.  Musculoskeletal: Normal range of motion. She exhibits no edema or tenderness.  Neurological: She is alert and oriented to person, place, and time. She has normal strength. No cranial nerve deficit or sensory deficit. GCS eye subscore is 4. GCS verbal subscore is 5. GCS motor subscore is 6.  Skin: Skin is warm and dry. No abrasion and no rash noted.  Psychiatric: She has a normal mood and affect. Her speech is normal and behavior is normal.  Nursing note and vitals reviewed.    ED Treatments / Results  Labs (all labs ordered are listed, but only abnormal results are displayed) Labs Reviewed  URINALYSIS, ROUTINE W REFLEX MICROSCOPIC - Abnormal; Notable for the following:       Result Value   Color, Urine AMBER (*)    Specific Gravity, Urine 1.001 (*)    Nitrite POSITIVE (*)    Bacteria, UA RARE (*)    All other components within normal limits  BASIC METABOLIC PANEL - Abnormal; Notable for the following:    Potassium 3.3 (*)    Glucose, Bld 120 (*)    All other components within normal limits  CBC  POC URINE PREG, ED    EKG  EKG  Interpretation None       Radiology No results found.  Procedures Procedures (including critical care time)  Medications Ordered in ED Medications  fentaNYL (SUBLIMAZE) injection 50 mcg (50 mcg Intravenous Refused 07/14/16 0620)  ibuprofen (ADVIL,MOTRIN) tablet 400 mg (400 mg Oral Given 07/14/16 0706)     Initial Impression / Assessment and Plan / ED Course  I have reviewed the triage vital signs and the nursing notes.  Pertinent labs & imaging results that were available during my care of the patient were reviewed by me and considered in my medical decision making (see chart for details).  Clinical Course     Ultrasound negative. Patient to continue taking her antibiotics.  Final Clinical Impressions(s) / ED Diagnoses  Final diagnoses:  None    New Prescriptions New Prescriptions   No medications on file     Lacretia Leigh, MD 07/14/16 [phone]

## 2016-07-14 NOTE — ED Notes (Signed)
Pt expressing frustration with the long wait.  She has worked the night shift and has a bit of a drive home and is very sleepy.  Encouragement given and lights out for comfort.

## 2016-07-14 NOTE — ED Notes (Signed)
Bed: WA04 Expected date:  Expected time:  Means of arrival:  Comments: Hold for RN upstairs

## 2016-07-14 NOTE — ED Triage Notes (Signed)
Pt complains of acute left flank pain at 3am, she was seen at her doctor earlier yesterday and is being treated for a UTI.

## 2016-07-15 ENCOUNTER — Emergency Department (HOSPITAL_COMMUNITY): Payer: 59

## 2016-07-15 ENCOUNTER — Emergency Department (HOSPITAL_COMMUNITY)
Admission: EM | Admit: 2016-07-15 | Discharge: 2016-07-15 | Disposition: A | Discharge status: Home / Self Care | Payer: 59 | Attending: Emergency Medicine | Admitting: Emergency Medicine

## 2016-07-15 ENCOUNTER — Encounter (HOSPITAL_COMMUNITY): Payer: Self-pay

## 2016-07-15 DIAGNOSIS — Z79899 Other long term (current) drug therapy: Secondary | ICD-10-CM | POA: Diagnosis not present

## 2016-07-15 DIAGNOSIS — R109 Unspecified abdominal pain: Secondary | ICD-10-CM | POA: Diagnosis present

## 2016-07-15 DIAGNOSIS — N132 Hydronephrosis with renal and ureteral calculous obstruction: Secondary | ICD-10-CM | POA: Insufficient documentation

## 2016-07-15 DIAGNOSIS — N200 Calculus of kidney: Secondary | ICD-10-CM

## 2016-07-15 LAB — CBC WITH DIFFERENTIAL/PLATELET
BASOS PCT: 0 %
Basophils Absolute: 0 10*3/uL (ref 0.0–0.1)
EOS ABS: 0.1 10*3/uL (ref 0.0–0.7)
EOS PCT: 1 %
HCT: 39.5 % (ref 36.0–46.0)
HEMOGLOBIN: 13.3 g/dL (ref 12.0–15.0)
Lymphocytes Relative: 12 %
Lymphs Abs: 1.4 10*3/uL (ref 0.7–4.0)
MCH: 29.8 pg (ref 26.0–34.0)
MCHC: 33.7 g/dL (ref 30.0–36.0)
MCV: 88.4 fL (ref 78.0–100.0)
Monocytes Absolute: 0.7 10*3/uL (ref 0.1–1.0)
Monocytes Relative: 6 %
NEUTROS PCT: 81 %
Neutro Abs: 9.3 10*3/uL — ABNORMAL HIGH (ref 1.7–7.7)
PLATELETS: 389 10*3/uL (ref 150–400)
RBC: 4.47 MIL/uL (ref 3.87–5.11)
RDW: 13.4 % (ref 11.5–15.5)
WBC: 11.4 10*3/uL — AB (ref 4.0–10.5)

## 2016-07-15 LAB — URINE CULTURE: Organism ID, Bacteria: NO GROWTH

## 2016-07-15 MED ORDER — SODIUM CHLORIDE 0.9 % IV SOLN
INTRAVENOUS | Status: DC
  Administered 2016-07-15: 125 mL/h via INTRAVENOUS

## 2016-07-15 MED ORDER — ONDANSETRON 8 MG PO TBDP
8.0000 mg | ORAL_TABLET | Freq: Three times a day (TID) | ORAL | 0 refills | Status: DC | PRN
Start: 2016-07-15 — End: 2016-09-05

## 2016-07-15 MED ORDER — OXYCODONE-ACETAMINOPHEN 5-325 MG PO TABS: 1.0000 | ORAL_TABLET | ORAL | 0 refills | Status: DC | PRN

## 2016-07-15 MED ORDER — ONDANSETRON HCL 4 MG/2ML IJ SOLN
4.0000 mg | Freq: Once | INTRAMUSCULAR | Status: AC
  Administered 2016-07-15: 4 mg via INTRAVENOUS
  Filled 2016-07-15: qty 2

## 2016-07-15 MED ORDER — KETOROLAC TROMETHAMINE 30 MG/ML IJ SOLN
30.0000 mg | Freq: Once | INTRAMUSCULAR | Status: AC
  Administered 2016-07-15: 30 mg via INTRAVENOUS
  Filled 2016-07-15: qty 1

## 2016-07-15 MED ORDER — IOPAMIDOL (ISOVUE-300) INJECTION 61%
INTRAVENOUS | Status: AC
  Administered 2016-07-15: 100 mL via INTRAVENOUS
  Filled 2016-07-15: qty 30

## 2016-07-15 MED ORDER — IOPAMIDOL (ISOVUE-300) INJECTION 61%
INTRAVENOUS | Status: AC
  Filled 2016-07-15: qty 100

## 2016-07-15 MED ORDER — IOPAMIDOL (ISOVUE-300) INJECTION 61%: 15.0000 mL | Freq: Once | INTRAVENOUS | Status: DC | PRN

## 2016-07-15 MED ORDER — TAMSULOSIN HCL 0.4 MG PO CAPS: 0.4000 mg | ORAL_CAPSULE | Freq: Every day | ORAL | 0 refills | Status: DC

## 2016-07-15 MED ORDER — MORPHINE SULFATE (PF) 2 MG/ML IV SOLN
2.0000 mg | Freq: Once | INTRAVENOUS | Status: AC
  Administered 2016-07-15: 2 mg via INTRAVENOUS

## 2016-07-15 MED ORDER — LORAZEPAM 2 MG/ML IJ SOLN
0.5000 mg | Freq: Once | INTRAMUSCULAR | Status: AC
  Administered 2016-07-15: 0.5 mg via INTRAVENOUS
  Filled 2016-07-15: qty 1

## 2016-07-15 MED ORDER — HYDROMORPHONE HCL 1 MG/ML IJ SOLN
1.0000 mg | Freq: Once | INTRAMUSCULAR | Status: AC
Start: 2016-07-15 — End: 2016-07-15
  Administered 2016-07-15: 1 mg via INTRAVENOUS
  Filled 2016-07-15: qty 1

## 2016-07-15 MED ORDER — MORPHINE SULFATE (PF) 4 MG/ML IV SOLN: 4.0000 mg | Freq: Once | INTRAVENOUS | Status: DC

## 2016-07-15 MED ORDER — MORPHINE SULFATE (PF) 4 MG/ML IV SOLN
6.0000 mg | Freq: Once | INTRAVENOUS | Status: AC
  Administered 2016-07-15: 4 mg via INTRAVENOUS
  Filled 2016-07-15: qty 2

## 2016-07-15 NOTE — ED Triage Notes (Signed)
Pt c/o L abdominal pain radiating into L flank and emesis starting this morning.  Pain score 10/10.  Pt has not taken anything for pain.  Pt was seen at [facility] yesterday for L flank pain and had recent UTI diagnosis.  Pt reports taking medications as prescribed.

## 2016-07-15 NOTE — ED Provider Notes (Signed)
[facility] DEPT Provider Note   CSN: [id number] Arrival date & time: 07/15/16  1103     History   Chief Complaint Chief Complaint  Patient presents with  . Abdominal Pain  . Flank Pain  . Emesis    HPI Casey Clarke is a 35 y.o. female.  35 year old female here with acute onset of left-sided flank pain. Seen by myself yesterday for similar symptoms and had an ultrasound of her kidney on the left side which was negative. Felt better but today acutely had return of the pain. Pain characterized as sharp and radiating to her left lower quadrant. Had associated nausea and vomiting with it today but no fever but some chills. No dysuria or hematuria. No vaginal bleeding or discharge.      Past Medical History:  Diagnosis Date  . Abnormal Pap smear of cervix 2014, 03/2014   ASCUS, pos HR HPV, 18/45; 2015 LGSIL, + HR HPV  . Dermoid cyst of ovary, left 03/2016  . Fibroid   . Frequency of urination   . History of gastric ulcer    remote hx - resolved  . History of palpitations    hypersenitive to codeine and per pt sometimes tachy  . History of recurrent miscarriages   . 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 Infertility, female   . Migraine   . Nocturia   . PONV (postoperative nausea and vomiting)     Patient Active Problem List   Diagnosis Date Noted  . Chest pain 03/11/2014  . Tricuspid regurgitation 03/11/2014  . Palpitations 03/11/2014    Past Surgical History:  Procedure Laterality Date  . AUGMENTATION MAMMAPLASTY Bilateral 01/17/10   implants  . CHROMOPERTUBATION N/A 06/19/2016   Procedure: CHROMOPERTUBATION;  Surgeon: Governor Specking, MD;  Location: [facility];  Service: Gynecology;  Laterality: N/A;  . COLPOSCOPY  2014, 2015   2015 LGSIL, neg ECC  . DILATION AND EVACUATION  12/30/2007   missed ab  . LAPAROSCOPIC GELPORT ASSISTED MYOMECTOMY N/A 06/19/2016   Procedure: LAPAROSCOPIC GELPORT ASSISTED MYOMECTOMY;  Surgeon: Governor Specking, MD;   Location: [place];  Service: Gynecology;  Laterality: N/A;  . OVARIAN CYST REMOVAL Left 06/19/2016   Procedure: LEFT OVARIAN CYSTECTOMY;  Surgeon: Governor Specking, MD;  Location: [place];  Service: Gynecology;  Laterality: Left;  . TRANSTHORACIC ECHOCARDIOGRAM  01/08/2014   ef 56%/  mild to moderater TR/  trivial PR    OB History    Gravida Para Term Preterm AB Living   2 0 0 0 2 0   SAB TAB Ectopic Multiple Live Births   2 0 0 0 0       Home Medications    Prior to Admission medications   Medication Sig Start Date End Date Taking? Authorizing Provider  nitrofurantoin, macrocrystal-monohydrate, (MACROBID) 100 MG capsule 1 capsule BID for 7 days and then PC prn Patient taking differently: Take 100 mg by mouth 2 (two) times daily. 1 capsule BID for 7 days and then PC prn 07/13/16   Kem Boroughs, FNP  Nutritional Supplements (JUICE PLUS FIBRE PO) Take 2 tablets by mouth daily.    Historical Provider, MD  phenazopyridine (PYRIDIUM) 200 MG tablet Take 1 tablet (200 mg total) by mouth 3 (three) times daily as needed for pain. 07/13/16   Kem Boroughs, FNP    Family History Family History  Problem Relation Age of Onset  . Thyroid disease Mother   . Hypertension Maternal Grandmother   . Thyroid disease  Maternal Grandmother   . Heart attack Maternal Grandfather   . Cancer Maternal Grandfather     lymph nodes  . Cancer Paternal Grandmother     stomach  . Heart disease Paternal Grandfather     Psychologist, forensic  . Hypotension Paternal Grandfather     Social History Social History  Substance Use Topics  . Smoking status: Never Smoker  . Smokeless tobacco: Never Used  . Alcohol use 0.0 oz/week     Comment: social     Allergies   Codeine   Review of Systems Review of Systems  All other systems reviewed and are negative.    Physical Exam Updated Vital Signs BP (!) 146/106 (BP Location: Right Arm)   Pulse 96   Temp 97.8 F (36.6 C)  (Oral)   Resp 22   LMP 06/15/2016   SpO2 100%   Physical Exam  Constitutional: She is oriented to person, place, and time. She appears well-developed and well-nourished.  Non-toxic appearance. No distress.  HENT:  Head: Normocephalic and atraumatic.  Eyes: Conjunctivae, EOM and lids are normal. Pupils are equal, round, and reactive to light.  Neck: Normal range of motion. Neck supple. No tracheal deviation present. No thyroid mass present.  Cardiovascular: Normal rate, regular rhythm and normal heart sounds.  Exam reveals no gallop.   No murmur heard. Pulmonary/Chest: Effort normal and breath sounds normal. No stridor. No respiratory distress. She has no decreased breath sounds. She has no wheezes. She has no rhonchi. She has no rales.  Abdominal: Soft. Normal appearance and bowel sounds are normal. She exhibits no distension. There is no tenderness. There is no rebound and no CVA tenderness.    Musculoskeletal: Normal range of motion. She exhibits no edema or tenderness.  Neurological: She is alert and oriented to person, place, and time. She has normal strength. No cranial nerve deficit or sensory deficit. GCS eye subscore is 4. GCS verbal subscore is 5. GCS motor subscore is 6.  Skin: Skin is warm and dry. No abrasion and no rash noted.  Psychiatric: She has a normal mood and affect. Her speech is normal and behavior is normal.  Nursing note and vitals reviewed.    ED Treatments / Results  Labs (all labs ordered are listed, but only abnormal results are displayed) Labs Reviewed  CBC WITH DIFFERENTIAL/PLATELET  URINALYSIS, ROUTINE W REFLEX MICROSCOPIC    EKG  EKG Interpretation None       Radiology US Renal  Result Date: 07/14/2016 CLINICAL DATA:  Left flank pain since 3 a.m. EXAM: RENAL / URINARY TRACT ULTRASOUND COMPLETE COMPARISON:  Ultrasound 05/24/2012 FINDINGS: Right Kidney: Length: 10.6 cm. Echogenicity within normal limits. No mass or hydronephrosis visualized.  Left Kidney: Length: 10.7 cm. Echogenicity within normal limits. No mass or hydronephrosis visualized. Bladder: Appears normal for degree of bladder distention. IMPRESSION: Negative renal ultrasound. Electronically Signed   By: Markus Daft M.D.   On: 07/14/2016 09:04    Procedures Procedures (including critical care time)  Medications Ordered in ED Medications  0.9 %  sodium chloride infusion (not administered)  ondansetron (ZOFRAN) injection 4 mg (not administered)  morphine 4 MG/ML injection 6 mg (not administered)  LORazepam (ATIVAN) injection 0.5 mg (not administered)     Initial Impression / Assessment and Plan / ED Course  I have reviewed the triage vital signs and the nursing notes.  Pertinent labs & imaging results that were available during my care of the patient were reviewed by me and considered in  my medical decision making (see chart for details).  Clinical Course     Patient medicated for pain here and feels better. CT consistent with kidney stone. Will place patient on medications and give referral to urology  Final Clinical Impressions(s) / ED Diagnoses   Final diagnoses:  None    New Prescriptions New Prescriptions   No medications on file     Lacretia Leigh, MD 07/15/16 1316

## 2016-07-15 NOTE — ED Notes (Signed)
PT have been made aware of urine sample. Requesting pain med. RN have been made aware

## 2016-07-17 ENCOUNTER — Encounter (HOSPITAL_COMMUNITY): Payer: Self-pay | Admitting: Emergency Medicine

## 2016-07-17 ENCOUNTER — Emergency Department (HOSPITAL_COMMUNITY)
Admission: EM | Admit: 2016-07-17 | Discharge: 2016-07-17 | Disposition: A | Discharge status: Home / Self Care | Payer: 59 | Attending: Emergency Medicine | Admitting: Emergency Medicine

## 2016-07-17 DIAGNOSIS — R109 Unspecified abdominal pain: Secondary | ICD-10-CM | POA: Diagnosis present

## 2016-07-17 DIAGNOSIS — N23 Unspecified renal colic: Secondary | ICD-10-CM | POA: Insufficient documentation

## 2016-07-17 LAB — CBC WITH DIFFERENTIAL/PLATELET
Basophils Absolute: 0 10*3/uL (ref 0.0–0.1)
Basophils Relative: 0 %
Eosinophils Absolute: 0 10*3/uL (ref 0.0–0.7)
Eosinophils Relative: 0 %
HEMATOCRIT: 34.7 % — AB (ref 36.0–46.0)
HEMOGLOBIN: 11.9 g/dL — AB (ref 12.0–15.0)
LYMPHS ABS: 0.7 10*3/uL (ref 0.7–4.0)
Lymphocytes Relative: 6 %
MCH: 30.1 pg (ref 26.0–34.0)
MCHC: 34.3 g/dL (ref 30.0–36.0)
MCV: 87.8 fL (ref 78.0–100.0)
MONOS PCT: 10 %
Monocytes Absolute: 1.3 10*3/uL — ABNORMAL HIGH (ref 0.1–1.0)
NEUTROS PCT: 84 %
Neutro Abs: 11.3 10*3/uL — ABNORMAL HIGH (ref 1.7–7.7)
Platelets: 321 10*3/uL (ref 150–400)
RBC: 3.95 MIL/uL (ref 3.87–5.11)
RDW: 13.2 % (ref 11.5–15.5)
WBC: 13.3 10*3/uL — AB (ref 4.0–10.5)

## 2016-07-17 LAB — BASIC METABOLIC PANEL
Anion gap: 9 (ref 5–15)
BUN: 9 mg/dL (ref 6–20)
CHLORIDE: 101 mmol/L (ref 101–111)
CO2: 26 mmol/L (ref 22–32)
Calcium: 8.7 mg/dL — ABNORMAL LOW (ref 8.9–10.3)
Creatinine, Ser: 1.14 mg/dL — ABNORMAL HIGH (ref 0.44–1.00)
GFR calc Af Amer: >60 mL/min (ref >60)
GFR calc non Af Amer: >60 mL/min (ref >60)
GLUCOSE: 151 mg/dL — AB (ref 65–99)
POTASSIUM: 3.3 mmol/L — AB (ref 3.5–5.1)
Sodium: 136 mmol/L (ref 135–145)

## 2016-07-17 LAB — URINALYSIS, ROUTINE W REFLEX MICROSCOPIC
Bilirubin Urine: NEGATIVE
GLUCOSE, UA: NEGATIVE mg/dL
KETONES UR: 20 mg/dL — AB
Leukocytes, UA: NEGATIVE
Nitrite: NEGATIVE
PROTEIN: NEGATIVE mg/dL
Specific Gravity, Urine: 1.006 (ref 1.005–1.030)
pH: 5 (ref 5.0–8.0)

## 2016-07-17 MED ORDER — KETOROLAC TROMETHAMINE 10 MG PO TABS: 10.0000 mg | ORAL_TABLET | Freq: Four times a day (QID) | ORAL | 0 refills | Status: DC | PRN

## 2016-07-17 MED ORDER — OXYCODONE-ACETAMINOPHEN 5-325 MG PO TABS: 1.0000 | ORAL_TABLET | ORAL | 0 refills | Status: DC | PRN

## 2016-07-17 MED ORDER — KETOROLAC TROMETHAMINE 30 MG/ML IJ SOLN
30.0000 mg | Freq: Once | INTRAMUSCULAR | Status: AC
Start: 2016-07-17 — End: 2016-07-17
  Administered 2016-07-17: 30 mg via INTRAVENOUS
  Filled 2016-07-17: qty 1

## 2016-07-17 MED ORDER — HYDROMORPHONE HCL 2 MG/ML IJ SOLN
1.0000 mg | Freq: Once | INTRAMUSCULAR | Status: AC
  Administered 2016-07-17: 1 mg via INTRAVENOUS
  Filled 2016-07-17: qty 1

## 2016-07-17 MED ORDER — SODIUM CHLORIDE 0.9 % IV SOLN
Freq: Once | INTRAVENOUS | Status: AC
  Administered 2016-07-17: 08:00:00 via INTRAVENOUS

## 2016-07-17 MED ORDER — ONDANSETRON HCL 4 MG/2ML IJ SOLN
4.0000 mg | Freq: Once | INTRAMUSCULAR | Status: AC
  Administered 2016-07-17: 4 mg via INTRAVENOUS
  Filled 2016-07-17: qty 2

## 2016-07-17 NOTE — ED Notes (Signed)
Nurse will attempt to draw labs with IV start. 

## 2016-07-17 NOTE — ED Triage Notes (Signed)
Pt reports left flank pain onset x2 days seen [facility] for same  Dx with kidney stones

## 2016-07-17 NOTE — ED Provider Notes (Signed)
[facility] DEPT Provider Note   CSN: [id number] Arrival date & time: 07/17/16  [id number]     History   Chief Complaint Chief Complaint  Patient presents with  . Flank Pain    left    HPI Casey Clarke is a 35 y.o. female.  Patient presents with persistent left flank pain. Patient reports being seen in the ER previously and having diagnosis of kidney stone. She reports that the pain has been constant and severe in the left flank overnight. Pain is now worse than it was previously.      Past Medical History:  Diagnosis Date  . Abnormal Pap smear of cervix 2014, 03/2014   ASCUS, pos HR HPV, 18/45; 2015 LGSIL, + HR HPV  . Dermoid cyst of ovary, left 03/2016  . Fibroid   . Frequency of urination   . History of gastric ulcer    remote hx - resolved  . History of palpitations    hypersenitive to codeine and per pt sometimes tachy  . History of recurrent miscarriages   . 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 Infertility, female   . Migraine   . Nocturia   . PONV (postoperative nausea and vomiting)     Patient Active Problem List   Diagnosis Date Noted  . Chest pain 03/11/2014  . Tricuspid regurgitation 03/11/2014  . Palpitations 03/11/2014    Past Surgical History:  Procedure Laterality Date  . AUGMENTATION MAMMAPLASTY Bilateral 01/17/10   implants  . CHROMOPERTUBATION N/A 06/19/2016   Procedure: CHROMOPERTUBATION;  Surgeon: Governor Specking, MD;  Location: [facility];  Service: Gynecology;  Laterality: N/A;  . COLPOSCOPY  2014, 2015   2015 LGSIL, neg ECC  . DILATION AND EVACUATION  12/30/2007   missed ab  . LAPAROSCOPIC GELPORT ASSISTED MYOMECTOMY N/A 06/19/2016   Procedure: LAPAROSCOPIC GELPORT ASSISTED MYOMECTOMY;  Surgeon: Governor Specking, MD;  Location: [place];  Service: Gynecology;  Laterality: N/A;  . OVARIAN CYST REMOVAL Left 06/19/2016   Procedure: LEFT OVARIAN CYSTECTOMY;  Surgeon: Governor Specking, MD;  Location: [place];  Service: Gynecology;  Laterality: Left;  . TRANSTHORACIC ECHOCARDIOGRAM  01/08/2014   ef 56%/  mild to moderater TR/  trivial PR    OB History    Gravida Para Term Preterm AB Living   2 0 0 0 2 0   SAB TAB Ectopic Multiple Live Births   2 0 0 0 0       Home Medications    Prior to Admission medications   Medication Sig Start Date End Date Taking? Authorizing Provider  nitrofurantoin, macrocrystal-monohydrate, (MACROBID) 100 MG capsule 1 capsule BID for 7 days and then PC prn Patient taking differently: Take 100 mg by mouth 2 (two) times daily. 1 capsule BID for 7 days and then PC prn 07/13/16   Kem Boroughs, FNP  Nutritional Supplements (JUICE PLUS FIBRE PO) Take 2 tablets by mouth daily.    Historical Provider, MD  ondansetron (ZOFRAN ODT) 8 MG disintegrating tablet Take 1 tablet (8 mg total) by mouth every 8 (eight) hours as needed for nausea or vomiting. 07/15/16   Lacretia Leigh, MD  oxyCODONE-acetaminophen (PERCOCET/ROXICET) 5-325 MG tablet Take 1-2 tablets by mouth every 4 (four) hours as needed for severe pain. 07/15/16   Lacretia Leigh, MD  phenazopyridine (PYRIDIUM) 200 MG tablet Take 1 tablet (200 mg total) by mouth 3 (three) times daily as needed for pain. 07/13/16   Kem Boroughs, FNP  tamsulosin (FLOMAX) 0.4 MG  CAPS capsule Take 1 capsule (0.4 mg total) by mouth daily. 07/15/16   Lacretia Leigh, MD    Family History Family History  Problem Relation Age of Onset  . Thyroid disease Mother   . Hypertension Maternal Grandmother   . Thyroid disease Maternal Grandmother   . Heart attack Maternal Grandfather   . Cancer Maternal Grandfather     lymph nodes  . Cancer Paternal Grandmother     stomach  . Heart disease Paternal Grandfather     Psychologist, forensic  . Hypotension Paternal Grandfather     Social History Social History  Substance Use Topics  . Smoking status: Never Smoker  . Smokeless tobacco: Never Used  . Alcohol use 0.0 oz/week      Comment: social     Allergies   Codeine   Review of Systems Review of Systems  Genitourinary: Positive for flank pain.  All other systems reviewed and are negative.    Physical Exam Updated Vital Signs BP 121/86 (BP Location: Right Arm)   Pulse 97   Temp 98.8 F (37.1 C) (Oral)   Resp 18   Ht 5\' 4"  (1.626 m)   Wt 130 lb (59 kg)   LMP 06/15/2016   SpO2 99%   BMI 22.31 kg/m   Physical Exam  Constitutional: She is oriented to person, place, and time. She appears well-developed and well-nourished. No distress.  HENT:  Head: Normocephalic and atraumatic.  Right Ear: Hearing normal.  Left Ear: Hearing normal.  Nose: Nose normal.  Mouth/Throat: Oropharynx is clear and moist and mucous membranes are normal.  Eyes: Conjunctivae and EOM are normal. Pupils are equal, round, and reactive to light.  Neck: Normal range of motion. Neck supple.  Cardiovascular: Regular rhythm, S1 normal and S2 normal.  Exam reveals no gallop and no friction rub.   No murmur heard. Pulmonary/Chest: Effort normal and breath sounds normal. No respiratory distress. She exhibits no tenderness.  Abdominal: Soft. Normal appearance and bowel sounds are normal. There is no hepatosplenomegaly. There is no tenderness. There is no rebound, no guarding, no tenderness at McBurney's point and negative Murphy's sign. No hernia.  Musculoskeletal: Normal range of motion.  Neurological: She is alert and oriented to person, place, and time. She has normal strength. No cranial nerve deficit or sensory deficit. Coordination normal. GCS eye subscore is 4. GCS verbal subscore is 5. GCS motor subscore is 6.  Skin: Skin is warm, dry and intact. No rash noted. No cyanosis.  Psychiatric: She has a normal mood and affect. Her speech is normal and behavior is normal. Thought content normal.  Nursing note and vitals reviewed.    ED Treatments / Results  Labs (all labs ordered are listed, but only abnormal results are  displayed) Labs Reviewed  CBC WITH DIFFERENTIAL/PLATELET - Abnormal; Notable for the following:       Result Value   WBC 13.3 (*)    Hemoglobin 11.9 (*)    HCT 34.7 (*)    Neutro Abs 11.3 (*)    Monocytes Absolute 1.3 (*)    All other components within normal limits  BASIC METABOLIC PANEL - Abnormal; Notable for the following:    Potassium 3.3 (*)    Glucose, Bld 151 (*)    Creatinine, Ser 1.14 (*)    Calcium 8.7 (*)    All other components within normal limits  URINALYSIS, ROUTINE W REFLEX MICROSCOPIC - Abnormal; Notable for the following:    Color, Urine STRAW (*)  Hgb urine dipstick MODERATE (*)    Ketones, ur 20 (*)    Bacteria, UA RARE (*)    Squamous Epithelial / LPF 0-5 (*)    All other components within normal limits    EKG  EKG Interpretation None       Radiology Ct Abdomen Pelvis W Contrast  Result Date: 07/15/2016 CLINICAL DATA:  Left abdominal pain radiating to the left flank. Emesis. EXAM: CT ABDOMEN AND PELVIS WITH CONTRAST TECHNIQUE: Multidetector CT imaging of the abdomen and pelvis was performed using the standard protocol following bolus administration of intravenous contrast. CONTRAST:  149mL ISOVUE-300 IOPAMIDOL (ISOVUE-300) INJECTION 61% COMPARISON:  Renal ultrasound 07/14/2016 FINDINGS: Lower chest: Lung bases are clear. No pleural effusions. There are bilateral breast implants. Hepatobiliary: Normal appearance of the liver, gallbladder and portal venous system. Pancreas: Normal appearance of the pancreas without inflammation or duct dilatation. Spleen: Normal appearance of spleen without enlargement. Adrenals/Urinary Tract: Normal adrenal glands. Normal appearance of the right kidney without hydronephrosis. Mild left hydroureteronephrosis with fluid along the anterior aspect of the left kidney and near the left renal hilum. 2 mm stone in the left kidney lower pole. 4 mm stone in the distal left ureter nodes near the left ureterovesical junction. The  urinary bladder is decompressed and unremarkable. Stomach/Bowel: Stomach is within normal limits. Appendix appears normal. No evidence of bowel wall thickening, distention, or inflammatory changes. Vascular/Lymphatic: Small amount of calcified plaque in the abdominal aorta without aneurysm. There is a circumaortic left renal vein. No significant lymph node enlargement in the abdomen or pelvis. Reproductive: Uterus appears to be mildly retroverted and there may be a small fibroid along the posterior aspect of the uterine fundus. No gross abnormality to the adnexal tissue. Other: Small amount of fluid in the pelvis is probably physiologic. Subcutaneous stranding in the anterior pelvis suggestive for prior surgery. Musculoskeletal: No acute abnormality. IMPRESSION: Left perinephric edema with mild left hydroureteronephrosis secondary to a 4 mm stone near the left ureterovesical junction. There is an additional small stone in the left kidney lower pole. Question a small uterine fibroid. Electronically Signed   By: Markus Daft M.D.   On: 07/15/2016 12:50    Procedures Procedures (including critical care time)  Medications Ordered in ED Medications  ondansetron (ZOFRAN) injection 4 mg (4 mg Intravenous Given 07/17/16 0559)  HYDROmorphone (DILAUDID) injection 1 mg (1 mg Intravenous Given 07/17/16 0600)  ketorolac (TORADOL) 30 MG/ML injection 30 mg (30 mg Intravenous Given 07/17/16 0659)  0.9 %  sodium chloride infusion ( Intravenous New Bag/Given 07/17/16 0731)     Initial Impression / Assessment and Plan / ED Course  I have reviewed the triage vital signs and the nursing notes.  Pertinent labs & imaging results that were available during my care of the patient were reviewed by me and considered in my medical decision making (see chart for details).  Clinical Course    Presents with complaints of persistent flank pain. Patient previsit diagnosed with kidney stone. Pain worsened overnight. Patient has  achieved excellent pain control with combination of Toradol and Dilaudid. No sign of infection and urinalysis today. Discussed briefly with Dr. Louis Meckel, will facilitate office visit.  Final Clinical Impressions(s) / ED Diagnoses   Final diagnoses:  Ureteral colic    New Prescriptions New Prescriptions   No medications on file     Orpah Greek, MD 07/17/16 [phone]

## 2016-07-17 NOTE — Progress Notes (Signed)
Encounter reviewed by Dr. Valerio Pinard Amundson C. Silva.  

## 2016-07-17 NOTE — ED Notes (Signed)
Lisette Grinder, RN given report.

## 2016-07-30 DIAGNOSIS — N202 Calculus of kidney with calculus of ureter: Secondary | ICD-10-CM | POA: Diagnosis not present

## 2016-08-17 DIAGNOSIS — N912 Amenorrhea, unspecified: Secondary | ICD-10-CM | POA: Diagnosis not present

## 2016-08-20 DIAGNOSIS — N912 Amenorrhea, unspecified: Secondary | ICD-10-CM | POA: Diagnosis not present

## 2016-09-03 DIAGNOSIS — N911 Secondary amenorrhea: Secondary | ICD-10-CM | POA: Diagnosis not present

## 2016-09-05 ENCOUNTER — Encounter (HOSPITAL_COMMUNITY): Payer: Self-pay

## 2016-09-06 NOTE — H&P (Addendum)
Casey Clarke is an 36 y.o. female G3P0 @ 7+ wks presenting for surgical mngt of missed Ab.    Menstrual History:  Patient's last menstrual period was 07/14/2016 (exact date).    Past Medical History:  Diagnosis Date  . Abnormal Pap smear of cervix 2014, 03/2014   ASCUS, pos HR HPV, 18/45; 2015 LGSIL, + HR HPV  . Dermoid cyst of ovary, left 03/2016  . Fibroid   . Frequency of urination   . Heart murmur   . History of gastric ulcer    remote hx - resolved  . History of kidney stones   . History of palpitations    hypersenitive to codeine and per pt sometimes tachy  . History of recurrent miscarriages   . 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 Infertility, female   . Migraine   . Nocturia   . PONV (postoperative nausea and vomiting)     Past Surgical History:  Procedure Laterality Date  . AUGMENTATION MAMMAPLASTY Bilateral 01/17/10   implants  . CHROMOPERTUBATION N/A 06/19/2016   Procedure: CHROMOPERTUBATION;  Surgeon: Governor Specking, MD;  Location: [facility];  Service: Gynecology;  Laterality: N/A;  . COLPOSCOPY  2014, 2015   2015 LGSIL, neg ECC  . DILATION AND EVACUATION  12/30/2007   missed ab  . LAPAROSCOPIC GELPORT ASSISTED MYOMECTOMY N/A 06/19/2016   Procedure: LAPAROSCOPIC GELPORT ASSISTED MYOMECTOMY;  Surgeon: Governor Specking, MD;  Location: [place];  Service: Gynecology;  Laterality: N/A;  . OVARIAN CYST REMOVAL Left 06/19/2016   Procedure: LEFT OVARIAN CYSTECTOMY;  Surgeon: Governor Specking, MD;  Location: [place];  Service: Gynecology;  Laterality: Left;  . TRANSTHORACIC ECHOCARDIOGRAM  01/08/2014   ef 56%/  mild to moderater TR/  trivial PR    Family History  Problem Relation Age of Onset  . Thyroid disease Mother   . Hypertension Maternal Grandmother   . Thyroid disease Maternal Grandmother   . Heart attack Maternal Grandfather   . Cancer Maternal Grandfather     lymph nodes  . Cancer Paternal Grandmother      stomach  . Heart disease Paternal Grandfather     Psychologist, forensic  . Hypotension Paternal Grandfather     Social History:  reports that she has never smoked. She has never used smokeless tobacco. She reports that she drinks alcohol. She reports that she does not use drugs.  Allergies:  Allergies  Allergen Reactions  . Codeine Other (See Comments)    "palpitations and heart races"    No prescriptions prior to admission.    ROS  Last menstrual period 07/14/2016. Physical Exam  Gen - NAD Abd - soft, NT/ND Ext - NT, no edema Cvx closed  O+ PV [place]:  iUP @ [redacted]w[redacted]d w/ no FHT  Assessment/Plan:  Missed Ab D&E with genetic studies R/b/a discussed, questions answered, informed consent  Casey Clarke 09/06/2016, 2:24 PM

## 2016-09-10 ENCOUNTER — Encounter (HOSPITAL_COMMUNITY): Payer: Self-pay | Admitting: *Deleted

## 2016-09-10 MED ORDER — DEXTROSE 5 % IV SOLN
2.0000 g | INTRAVENOUS | Status: AC
  Filled 2016-09-10: qty 2

## 2016-09-11 ENCOUNTER — Encounter (HOSPITAL_COMMUNITY): Payer: Self-pay | Admitting: Anesthesiology

## 2016-09-11 ENCOUNTER — Ambulatory Visit (HOSPITAL_COMMUNITY): Admission: RE | Admit: 2016-09-11 | Payer: 59 | Source: Ambulatory Visit | Admitting: Obstetrics and Gynecology

## 2016-09-11 HISTORY — DX: Personal history of urinary calculi: Z87.442

## 2016-09-11 HISTORY — DX: Cardiac murmur, unspecified: R01.1

## 2016-09-11 SURGERY — DILATION AND EVACUATION, UTERUS
Anesthesia: General

## 2016-10-08 DIAGNOSIS — R1084 Generalized abdominal pain: Secondary | ICD-10-CM | POA: Diagnosis not present

## 2016-10-17 DIAGNOSIS — Z6821 Body mass index (BMI) 21.0-21.9, adult: Secondary | ICD-10-CM | POA: Diagnosis not present

## 2016-11-23 ENCOUNTER — Ambulatory Visit (INDEPENDENT_AMBULATORY_CARE_PROVIDER_SITE_OTHER): Payer: 59 | Admitting: Primary Care

## 2016-11-23 ENCOUNTER — Encounter: Payer: Self-pay | Admitting: Primary Care

## 2016-11-23 VITALS — BP 118/78 | HR 82 | Temp 97.9°F | Ht 65.0 in | Wt 128.8 lb

## 2016-11-23 DIAGNOSIS — N200 Calculus of kidney: Secondary | ICD-10-CM | POA: Insufficient documentation

## 2016-11-23 DIAGNOSIS — Z0184 Encounter for antibody response examination: Secondary | ICD-10-CM

## 2016-11-23 DIAGNOSIS — Z0000 Encounter for general adult medical examination without abnormal findings: Secondary | ICD-10-CM | POA: Insufficient documentation

## 2016-11-23 DIAGNOSIS — Z111 Encounter for screening for respiratory tuberculosis: Secondary | ICD-10-CM

## 2016-11-23 DIAGNOSIS — Z0001 Encounter for general adult medical examination with abnormal findings: Secondary | ICD-10-CM | POA: Insufficient documentation

## 2016-11-23 LAB — CBC
HEMATOCRIT: 42.5 % (ref 36.0–46.0)
Hemoglobin: 14.3 g/dL (ref 12.0–15.0)
MCHC: 33.7 g/dL (ref 30.0–36.0)
MCV: 89.4 fl (ref 78.0–100.0)
Platelets: 381 10*3/uL (ref 150.0–400.0)
RBC: 4.75 Mil/uL (ref 3.87–5.11)
RDW: 13.3 % (ref 11.5–15.5)
WBC: 8.8 10*3/uL (ref 4.0–10.5)

## 2016-11-23 LAB — COMPREHENSIVE METABOLIC PANEL
ALT: 19 U/L (ref 0–35)
AST: 17 U/L (ref 0–37)
Albumin: 4.6 g/dL (ref 3.5–5.2)
Alkaline Phosphatase: 36 U/L — ABNORMAL LOW (ref 39–117)
BUN: 12 mg/dL (ref 6–23)
CHLORIDE: 101 meq/L (ref 96–112)
CO2: 28 meq/L (ref 19–32)
CREATININE: 0.85 mg/dL (ref 0.40–1.20)
Calcium: 9.7 mg/dL (ref 8.4–10.5)
GFR: 80.94 mL/min (ref >60.00)
GLUCOSE: 90 mg/dL (ref 70–99)
POTASSIUM: 3.6 meq/L (ref 3.5–5.1)
SODIUM: 137 meq/L (ref 135–145)
Total Bilirubin: 0.3 mg/dL (ref 0.2–1.2)
Total Protein: 7.3 g/dL (ref 6.0–8.3)

## 2016-11-23 LAB — LIPID PANEL
CHOL/HDL RATIO: 3
Cholesterol: 174 mg/dL (ref 0–200)
HDL: 58.4 mg/dL (ref >39.00)
LDL CALC: 94 mg/dL (ref 0–99)
NONHDL: 115.42
Triglycerides: 106 mg/dL (ref 0.0–149.0)
VLDL: 21.2 mg/dL (ref 0.0–40.0)

## 2016-11-23 MED ORDER — KETOROLAC TROMETHAMINE 10 MG PO TABS: ORAL_TABLET | ORAL | 0 refills | Status: DC

## 2016-11-23 NOTE — Assessment & Plan Note (Signed)
Immunizations UTD. Pap UTD. Discussed to increase consumption of vegetables, fruit, whole grains. Discussed to start exercising. Exam unremarkable. Labs pending. Follow up in 1 year for annual exam.

## 2016-11-23 NOTE — Assessment & Plan Note (Signed)
Recent history with one stone remaining. Provided Rx for oral Toradol to use PRN, discussed instructions for use.

## 2016-11-23 NOTE — Progress Notes (Signed)
Subjective:    Patient ID: Casey Clarke, female    DOB: 1981/02/28, 36 y.o.   MRN: [id number]  HPI  Ms. Mare Loan is a 36 year old female who presents today to establish care and discuss the problems mentioned below. Will obtain old records.  1) Form Completion: She is applying to [employer] for the BSN bridge program and is needing form completion. She also needs the MMR titer and TB Quantiferon.   2) Renal Stones: Recent stone with one stone remaining. She will typically have to go to the emergency department for IM Toradol when she experiences pain.   Immunizations: -Tetanus: Completed in 2013 -Influenza: Completed last season   Diet: She endorses a healthy diet. Breakfast: Cereal, biscuit Lunch: Sandwich Dinner: Meat, vegetable, starch Snacks: None Desserts: None Beverages: Water  Exercise: She does not currently exercise. Eye exam: Completed in 2017. Dental exam: Completes semi-annually Pap Smear: Completed in October 2017, normal   Review of Systems  Constitutional: Negative for unexpected weight change.  HENT: Negative for rhinorrhea.   Respiratory: Negative for cough and shortness of breath.   Cardiovascular: Negative for chest pain.  Gastrointestinal: Positive for constipation. Negative for diarrhea.       Constipation since starting Folic Acid  Genitourinary: Negative for difficulty urinating and menstrual problem.       History of renal stones, one renal stone remaining.  Musculoskeletal: Negative for arthralgias and myalgias.  Skin: Negative for rash.  Allergic/Immunologic: Negative for environmental allergies.  Neurological: Negative for dizziness, numbness and headaches.  Psychiatric/Behavioral:       Denies concerns for anxiety and depression       Past Medical History:  Diagnosis Date  . Abnormal Pap smear of cervix 2014, 03/2014   ASCUS, pos HR HPV, 18/45; 2015 LGSIL, + HR HPV  . Dermoid cyst of ovary, left 03/2016  . Fibroid   . Heart murmur   .  History of gastric ulcer    remote hx - resolved  . History of kidney stones   . History of palpitations    hypersenitive to codeine and per pt sometimes tachy  . History of recurrent miscarriages   . 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 Infertility, female   . Migraine   . Nocturia   . PONV (postoperative nausea and vomiting)      Social History   Social History  . Marital status: Married    Spouse name: N/A  . Number of children: 0  . Years of education: N/A   Occupational History  . Not on file.   Social History Main Topics  . Smoking status: Never Smoker  . Smokeless tobacco: Never Used  . Alcohol use 0.0 oz/week     Comment: social  . Drug use: No  . Sexual activity: Yes    Partners: Male    Birth control/ protection: None   Other Topics Concern  . Not on file   Social History Narrative  . No narrative on file    Past Surgical History:  Procedure Laterality Date  . AUGMENTATION MAMMAPLASTY Bilateral 01/17/10   implants  . CHROMOPERTUBATION N/A 06/19/2016   Procedure: CHROMOPERTUBATION;  Surgeon: Governor Specking, MD;  Location: [facility];  Service: Gynecology;  Laterality: N/A;  . COLPOSCOPY  2014, 2015   2015 LGSIL, neg ECC  . DILATION AND EVACUATION  12/30/2007   missed ab  . LAPAROSCOPIC GELPORT ASSISTED MYOMECTOMY N/A 06/19/2016   Procedure: LAPAROSCOPIC GELPORT ASSISTED MYOMECTOMY;  Surgeon: Governor Specking,  MD;  Location: [place];  Service: Gynecology;  Laterality: N/A;  . LAPAROSCOPY     MYOMECTOMY  . OVARIAN CYST REMOVAL Left 06/19/2016   Procedure: LEFT OVARIAN CYSTECTOMY;  Surgeon: Governor Specking, MD;  Location: [facility];  Service: Gynecology;  Laterality: Left;  . TRANSTHORACIC ECHOCARDIOGRAM  01/08/2014   ef 56%/  mild to moderater TR/  trivial PR    Family History  Problem Relation Age of Onset  . Thyroid disease Mother   . Hypertension Maternal Grandmother   . Thyroid disease Maternal  Grandmother   . Heart attack Maternal Grandfather   . Cancer Maternal Grandfather     lymph nodes  . Cancer Paternal Grandmother     stomach  . Heart disease Paternal Grandfather     Psychologist, forensic  . Hypotension Paternal Grandfather     Allergies  Allergen Reactions  . Codeine Other (See Comments)    "palpitations and heart races"    No current outpatient prescriptions on file prior to visit.   No current facility-administered medications on file prior to visit.     BP 118/78   Pulse 82   Temp 97.9 F (36.6 C) (Oral)   Ht 5' 5"  (1.651 m)   Wt 128 lb 12.8 oz (58.4 kg)   LMP 11/10/2015   SpO2 99%   BMI 21.43 kg/m    Objective:   Physical Exam  Constitutional: She is oriented to person, place, and time. She appears well-nourished.  HENT:  Right Ear: Tympanic membrane and ear canal normal.  Left Ear: Tympanic membrane and ear canal normal.  Nose: Nose normal.  Mouth/Throat: Oropharynx is clear and moist.  Eyes: Conjunctivae and EOM are normal. Pupils are equal, round, and reactive to light.  Neck: Neck supple. No thyromegaly present.  Cardiovascular: Normal rate and regular rhythm.   No murmur heard. Pulmonary/Chest: Effort normal and breath sounds normal. She has no rales.  Abdominal: Soft. Bowel sounds are normal. There is no tenderness.  Musculoskeletal: Normal range of motion.  Lymphadenopathy:    She has no cervical adenopathy.  Neurological: She is alert and oriented to person, place, and time. She has normal reflexes. No cranial nerve deficit.  Skin: Skin is warm and dry. No rash noted.  Psychiatric: She has a normal mood and affect.          Assessment & Plan:

## 2016-11-23 NOTE — Patient Instructions (Signed)
Complete lab work prior to leaving today. I will notify you of your results once received.   Start exercising. You should be getting 150 minutes of moderate intensity exercise weekly.  Work to increase consumption of vegetables, fruit, whole grains.  Follow up in 1 year for your annual exam or sooner if needed.  It was a pleasure to meet you today! Please don't hesitate to call me with any questions. Welcome to [facility]!

## 2016-11-23 NOTE — Progress Notes (Signed)
Pre visit review using our clinic review tool, if applicable. No additional management support is needed unless otherwise documented below in the visit note. 

## 2016-11-26 LAB — QUANTIFERON TB GOLD ASSAY (BLOOD)
Interferon Gamma Release Assay: NEGATIVE
MITOGEN-NIL SO: 9.88 [IU]/mL
QUANTIFERON TB AG MINUS NIL: 0.03 [IU]/mL
Quantiferon Nil Value: 0.04 IU/mL

## 2016-11-26 LAB — MEASLES/MUMPS/RUBELLA IMMUNITY
Mumps IgG: 9.17 AU/mL — ABNORMAL HIGH (ref <9.00)
Rubella: 3.78 Index — ABNORMAL HIGH (ref <0.90)

## 2017-03-19 ENCOUNTER — Telehealth: Payer: Self-pay | Admitting: *Deleted

## 2017-03-19 ENCOUNTER — Encounter: Payer: Self-pay | Admitting: Primary Care

## 2017-03-19 NOTE — Telephone Encounter (Signed)
Please call patient, can we send this information to [facility]? She is immune to MMR and does not need a vaccination.

## 2017-03-19 NOTE — Telephone Encounter (Signed)
Patient called stating that she is confused about the MMR titer that she previously had. Patient stated that she got a call today from [facility] telling her that she needs to get a MMR vaccination. Patient stated that she is going to send you a Pharmacist, community message with information that she got from [facility].. Patient stated that she thought that she did not need vaccinations based on the results of the titer.

## 2017-03-19 NOTE — Telephone Encounter (Signed)
Spoken to patient and she stated that she is not sure what is going. Patient already sent an email to [facility] asking about the MMR titer. She will let us know if she need something else from [place].

## 2017-03-19 NOTE — Telephone Encounter (Signed)
FYI. Disregard my previous message.

## 2017-05-03 ENCOUNTER — Ambulatory Visit: Payer: 59 | Admitting: Nurse Practitioner

## 2017-06-04 ENCOUNTER — Ambulatory Visit (INDEPENDENT_AMBULATORY_CARE_PROVIDER_SITE_OTHER): Payer: 59 | Admitting: Obstetrics & Gynecology

## 2017-06-04 ENCOUNTER — Other Ambulatory Visit (HOSPITAL_COMMUNITY)
Admission: RE | Admit: 2017-06-04 | Discharge: 2017-06-04 | Disposition: A | Discharge status: Home / Self Care | Payer: 59 | Source: Ambulatory Visit | Attending: Obstetrics & Gynecology | Admitting: Obstetrics & Gynecology

## 2017-06-04 ENCOUNTER — Encounter: Payer: Self-pay | Admitting: Obstetrics & Gynecology

## 2017-06-04 VITALS — BP 118/78 | HR 82 | Resp 14 | Ht 65.0 in | Wt 137.0 lb

## 2017-06-04 DIAGNOSIS — R87612 Low grade squamous intraepithelial lesion on cytologic smear of cervix (LGSIL): Secondary | ICD-10-CM | POA: Diagnosis not present

## 2017-06-04 DIAGNOSIS — R87810 Cervical high risk human papillomavirus (HPV) DNA test positive: Secondary | ICD-10-CM | POA: Insufficient documentation

## 2017-06-04 DIAGNOSIS — Z01419 Encounter for gynecological examination (general) (routine) without abnormal findings: Secondary | ICD-10-CM | POA: Diagnosis not present

## 2017-06-04 NOTE — Addendum Note (Signed)
Addended by: Megan Salon on: 06/04/2017 05:04 PM   Modules accepted: Orders

## 2017-06-04 NOTE — Progress Notes (Signed)
36 y.o. [id number] MarriedCaucasianF here for annual exam.  Seeing Dr. Ronita Hipps now as well due to recurrent miscarriage.  Has started evaluated and blood work showed MTHFR deficiency.  On folic acid.  Trying since March. After her miscarriage.  Thinking about taking a break.     Cycles are regular.    Patient's last menstrual period was 06/02/2017.          Sexually active: Yes.    The current method of family planning is none.    Exercising: No.  The patient does not participate in regular exercise at present. Smoker:  no  Health Maintenance: Pap:  05/01/16 ASCUS. HR HPV:neg   04/29/15 ASCUS. HR HPV:neg  History of abnormal Pap:  Yes, HPV + 03/2014  MMG:  N/A TDaP:  06/22/12  Screening Labs: Will discuss.    reports that  has never smoked. she has never used smokeless tobacco. She reports that she drinks alcohol. She reports that she does not use drugs.  Past Medical History:  Diagnosis Date  . Abnormal Pap smear of cervix 2014, 03/2014   ASCUS, pos HR HPV, 18/45; 2015 LGSIL, + HR HPV  . Dermoid cyst of ovary, left 03/2016  . Fibroid   . Heart murmur   . History of gastric ulcer    remote hx - resolved  . History of kidney stones   . History of palpitations    hypersenitive to codeine and per pt sometimes tachy  . History of recurrent miscarriages   . 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 Infertility, female   . Migraine   . Nocturia   . PONV (postoperative nausea and vomiting)     Past Surgical History:  Procedure Laterality Date  . AUGMENTATION MAMMAPLASTY Bilateral 01/17/10   implants  . COLPOSCOPY  2014, 2015   2015 LGSIL, neg ECC  . DILATION AND EVACUATION  12/30/2007   missed ab  . LAPAROSCOPY     MYOMECTOMY  . TRANSTHORACIC ECHOCARDIOGRAM  01/08/2014   ef 56%/  mild to moderater TR/  trivial PR    Current Outpatient Medications  Medication Sig Dispense Refill  . FOLIC ACID PO Take 4 mg by mouth daily.    Marland Kitchen letrozole (FEMARA) 2.5 MG tablet Take 1 tablet by mouth. Day 3-7 of  cycle    . OVIDREL 250 MCG/0.5ML injection INJECT 1/2 MILLILITER UNDER THE SKIN ONCE FOR 1 DOSE  0  . Prenatal Vit-Fe Fumarate-FA (MULTIVITAMIN-PRENATAL) 27-0.8 MG TABS tablet Take 1 tablet daily at 12 noon by mouth.     No current facility-administered medications for this visit.     Family History  Problem Relation Age of Onset  . Thyroid disease Mother   . Hypertension Maternal Grandmother   . Thyroid disease Maternal Grandmother   . Heart attack Maternal Grandfather   . Cancer Maternal Grandfather        lymph nodes  . Cancer Paternal Grandmother        stomach  . Heart disease Paternal Grandfather        Psychologist, forensic  . Hypotension Paternal Grandfather     ROS:  Pertinent items are noted in HPI.  Otherwise, a comprehensive ROS was negative.  Exam:   BP 118/78 (BP Location: Right Arm, Patient Position: Sitting, Cuff Size: Normal)   Pulse 82   Resp 14   Ht 5\' 5"  (1.651 m)   Wt 137 lb (62.1 kg)   LMP 06/02/2017   BMI 22.80 kg/m     Height: 5'  5" (165.1 cm)  Ht Readings from Last 3 Encounters:  06/04/17 5\' 5"  (1.651 m)  11/23/16 5\' 5"  (1.651 m)  07/17/16 5\' 4"  (1.626 m)    General appearance: alert, cooperative and appears stated age Head: Normocephalic, without obvious abnormality, atraumatic Neck: no adenopathy, supple, symmetrical, trachea midline and thyroid normal to inspection and palpation Lungs: clear to auscultation bilaterally Breasts: normal appearance, no masses or tenderness Heart: regular rate and rhythm Abdomen: soft, non-tender; bowel sounds normal; no masses,  no organomegaly Extremities: extremities normal, atraumatic, no cyanosis or edema Skin: Skin color, texture, turgor normal. No rashes or lesions Lymph nodes: Cervical, supraclavicular, and axillary nodes normal. No abnormal inguinal nodes palpated Neurologic: Grossly normal   Pelvic: External genitalia:  no lesions              Urethra:  normal appearing urethra with no masses, tenderness or  lesions              Bartholins and Skenes: normal                 Vagina: normal appearing vagina with normal color and discharge, no lesions              Cervix: no lesions              Pap taken: Yes.   Bimanual Exam:  Uterus:  normal size, contour, position, consistency, mobility, non-tender              Adnexa: normal adnexa and no mass, fullness, tenderness               Rectovaginal: Confirms               Anus:  normal sphincter tone, no lesions  Chaperone was present for exam.  A:  Well Woman with normal exam H/o recurrent miscarriage, has undergone work-up Nephrolithiasis H/O uterine fibroids and left dermoid excision 06/19/16 Bilateral breast implants H/o abnormal pap smears  P:   Mammogram guidelines reviewed pap smear with HR HPV obtained today On PNV and folic acid Return annually or prn

## 2017-06-07 LAB — CYTOLOGY - PAP
DIAGNOSIS: NEGATIVE
HPV (WINDOPATH): DETECTED — AB
HPV 16/18/45 GENOTYPING: POSITIVE — AB

## 2017-06-11 ENCOUNTER — Telehealth: Payer: Self-pay

## 2017-06-11 DIAGNOSIS — R87810 Cervical high risk human papillomavirus (HPV) DNA test positive: Secondary | ICD-10-CM

## 2017-06-11 NOTE — Telephone Encounter (Signed)
Spoke with patient. Colposcopy scheduled for tomorrow 06/12/2017 at 12 pm with Dr.Miller, time okay per Lamont Snowball, RN.   Instructions given. Motrin 800 mg po x , one hour before appointment with food. Make sure to eat a meal before appointment and drink plenty of fluids. Patient verbalized understanding and will call to reschedule if will be on menses or has any concerns regarding pregnancy. Patient agreeable and verbalized understanding of all instructions. Order placed.  Routing to provider for final review. Patient agreeable to disposition. Will close encounter.

## 2017-06-11 NOTE — Telephone Encounter (Signed)
-----   Message from Megan Salon, MD sent at 06/10/2017 11:21 PM EST ----- Please let pt know her pap was normal but HR HPV was +.  Also 16/18/45 testing was also positive.  Needs colposcopy.  Please schedule.

## 2017-06-11 NOTE — Telephone Encounter (Signed)
Spoke with patient. Advised of results as seen below from [place]. Patient verbalizes understanding. LMP was 06/02/2017. Patient is unavailable to be seen for colposcopy this week due to holiday. States she is scheduled to have IUI on Monday 11/26. Wishes to proceed with colposcopy with next menses if she is not pregnant.   Routing to [place] with review.

## 2017-06-11 NOTE — Telephone Encounter (Signed)
It is fine to proceed with colposcopy tomorrow and then IUI on Monday.

## 2017-06-11 NOTE — Telephone Encounter (Signed)
Spoke with patient at time of incoming call. States that she would like to know if Dr.Miller wants her to proceed with colposcopy tomorrow with her having IUI on Monday or wait. 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

## 2017-06-12 ENCOUNTER — Ambulatory Visit: Payer: 59 | Admitting: Obstetrics & Gynecology

## 2017-06-12 ENCOUNTER — Encounter: Payer: Self-pay | Admitting: Obstetrics & Gynecology

## 2017-06-12 ENCOUNTER — Other Ambulatory Visit: Payer: Self-pay

## 2017-06-12 DIAGNOSIS — R87810 Cervical high risk human papillomavirus (HPV) DNA test positive: Secondary | ICD-10-CM | POA: Diagnosis not present

## 2017-06-12 DIAGNOSIS — N72 Inflammatory disease of cervix uteri: Secondary | ICD-10-CM | POA: Diagnosis not present

## 2017-06-12 NOTE — Progress Notes (Signed)
36 y.o. Married Caucasian female here for colposcopy with possible biopsies and/or ECC due to +HR HPV on Pap obtained at [facility].  Pt has hx of abnormal pap smears.  Undergoing fertility treatment.  Has IUI planned for Monday.  Concerned about ECC, if needed, and interference with IUI next week.    Patient's last menstrual period was 06/02/2017.          Sexually active: Yes.    The current method of family planning is none.     Patient has been counseled about results and procedure.  Risks and benefits have bene reviewed including immediate and/or delayed bleeding, infection, cervical scaring from procedure, possibility of needing additional follow up as well as treatment.  rare risks of missing a lesion discussed as well.  All questions answered.  Pt ready to proceed.  BP 104/60 (BP Location: Right Arm, Patient Position: Sitting, Cuff Size: Normal)   Pulse 70   Resp 16   Wt 135 lb (61.2 kg)   LMP 06/02/2017   BMI 22.47 kg/m   Physical Exam  Constitutional: She is oriented to person, place, and time. She appears well-developed and well-nourished.  Genitourinary: Vagina normal. There is no rash, tenderness, lesion or injury on the right labia. There is no rash, tenderness, lesion or injury on the left labia.    Lymphadenopathy:       Right: No inguinal adenopathy present.       Left: No inguinal adenopathy present.  Neurological: She is alert and oriented to person, place, and time.  Psychiatric: She has a normal mood and affect.    Speculum placed.  3% acetic acid applied to cervix and upper third of vagina for >45 seconds.  Cervix and aupper third of vagina visualized with both 7.5X and 15X magnification.  Green filter also used.  Lugols solution was used.  Findings:  Small area of AWE and decreased staining with Lugol's solution.  Biopsy:  11 o'clock.  ECC:  was not performed.  Monsel's was needed.  Excellent hemostasis was present.  Pt tolerated procedure well and all instruments were  removed.  Findings noted above on picture of cervix.  Assessment:  +HR HPV  Plan:  Pathology results will be called to patient and follow-up planned pending results.

## 2017-09-05 DIAGNOSIS — Z3189 Encounter for other procreative management: Secondary | ICD-10-CM | POA: Diagnosis not present

## 2017-09-05 DIAGNOSIS — N96 Recurrent pregnancy loss: Secondary | ICD-10-CM | POA: Diagnosis not present

## 2017-10-29 DIAGNOSIS — Z3A01 Less than 8 weeks gestation of pregnancy: Secondary | ICD-10-CM | POA: Diagnosis not present

## 2017-10-29 DIAGNOSIS — O2621 Pregnancy care for patient with recurrent pregnancy loss, first trimester: Secondary | ICD-10-CM | POA: Diagnosis not present

## 2017-10-29 DIAGNOSIS — Z3201 Encounter for pregnancy test, result positive: Secondary | ICD-10-CM | POA: Diagnosis not present

## 2017-10-31 DIAGNOSIS — Z3A01 Less than 8 weeks gestation of pregnancy: Secondary | ICD-10-CM | POA: Diagnosis not present

## 2017-10-31 DIAGNOSIS — O2621 Pregnancy care for patient with recurrent pregnancy loss, first trimester: Secondary | ICD-10-CM | POA: Diagnosis not present

## 2017-11-04 DIAGNOSIS — O2621 Pregnancy care for patient with recurrent pregnancy loss, first trimester: Secondary | ICD-10-CM | POA: Diagnosis not present

## 2017-11-04 DIAGNOSIS — Z3A01 Less than 8 weeks gestation of pregnancy: Secondary | ICD-10-CM | POA: Diagnosis not present

## 2017-11-07 DIAGNOSIS — Z3A01 Less than 8 weeks gestation of pregnancy: Secondary | ICD-10-CM | POA: Diagnosis not present

## 2017-11-07 DIAGNOSIS — O2621 Pregnancy care for patient with recurrent pregnancy loss, first trimester: Secondary | ICD-10-CM | POA: Diagnosis not present

## 2017-11-13 DIAGNOSIS — Z3201 Encounter for pregnancy test, result positive: Secondary | ICD-10-CM | POA: Diagnosis not present

## 2017-12-05 LAB — OB RESULTS CONSOLE GC/CHLAMYDIA
CHLAMYDIA, DNA PROBE: NEGATIVE
GC PROBE AMP, GENITAL: NEGATIVE

## 2017-12-05 LAB — OB RESULTS CONSOLE RUBELLA ANTIBODY, IGM: Rubella: IMMUNE

## 2017-12-05 LAB — OB RESULTS CONSOLE RPR: RPR: NONREACTIVE

## 2017-12-05 LAB — OB RESULTS CONSOLE ANTIBODY SCREEN: Antibody Screen: NEGATIVE

## 2017-12-05 LAB — OB RESULTS CONSOLE HIV ANTIBODY (ROUTINE TESTING): HIV: NONREACTIVE

## 2017-12-05 LAB — OB RESULTS CONSOLE ABO/RH: RH TYPE: POSITIVE

## 2017-12-05 LAB — OB RESULTS CONSOLE HEPATITIS B SURFACE ANTIGEN: Hepatitis B Surface Ag: NEGATIVE

## 2017-12-10 DIAGNOSIS — O09511 Supervision of elderly primigravida, first trimester: Secondary | ICD-10-CM | POA: Diagnosis not present

## 2017-12-30 DIAGNOSIS — O26891 Other specified pregnancy related conditions, first trimester: Secondary | ICD-10-CM | POA: Diagnosis not present

## 2017-12-30 DIAGNOSIS — Z3A13 13 weeks gestation of pregnancy: Secondary | ICD-10-CM | POA: Diagnosis not present

## 2018-01-21 DIAGNOSIS — O26892 Other specified pregnancy related conditions, second trimester: Secondary | ICD-10-CM | POA: Diagnosis not present

## 2018-01-21 DIAGNOSIS — Z3A16 16 weeks gestation of pregnancy: Secondary | ICD-10-CM | POA: Diagnosis not present

## 2018-02-06 DIAGNOSIS — Z3A18 18 weeks gestation of pregnancy: Secondary | ICD-10-CM | POA: Diagnosis not present

## 2018-02-06 DIAGNOSIS — O09512 Supervision of elderly primigravida, second trimester: Secondary | ICD-10-CM | POA: Diagnosis not present

## 2018-02-26 DIAGNOSIS — Z3A21 21 weeks gestation of pregnancy: Secondary | ICD-10-CM | POA: Diagnosis not present

## 2018-02-26 DIAGNOSIS — O09512 Supervision of elderly primigravida, second trimester: Secondary | ICD-10-CM | POA: Diagnosis not present

## 2018-03-12 DIAGNOSIS — O09512 Supervision of elderly primigravida, second trimester: Secondary | ICD-10-CM | POA: Diagnosis not present

## 2018-03-12 DIAGNOSIS — Z3A23 23 weeks gestation of pregnancy: Secondary | ICD-10-CM | POA: Diagnosis not present

## 2018-04-09 DIAGNOSIS — O4702 False labor before 37 completed weeks of gestation, second trimester: Secondary | ICD-10-CM | POA: Diagnosis not present

## 2018-04-16 DIAGNOSIS — O99810 Abnormal glucose complicating pregnancy: Secondary | ICD-10-CM | POA: Diagnosis not present

## 2018-04-16 DIAGNOSIS — Z3A28 28 weeks gestation of pregnancy: Secondary | ICD-10-CM | POA: Diagnosis not present

## 2018-04-24 DIAGNOSIS — O358XX0 Maternal care for other (suspected) fetal abnormality and damage, not applicable or unspecified: Secondary | ICD-10-CM | POA: Diagnosis not present

## 2018-04-24 DIAGNOSIS — Z3A29 29 weeks gestation of pregnancy: Secondary | ICD-10-CM | POA: Diagnosis not present

## 2018-04-24 DIAGNOSIS — Z23 Encounter for immunization: Secondary | ICD-10-CM | POA: Diagnosis not present

## 2018-05-16 ENCOUNTER — Other Ambulatory Visit: Payer: Self-pay | Admitting: Obstetrics and Gynecology

## 2018-05-21 DIAGNOSIS — Z3A33 33 weeks gestation of pregnancy: Secondary | ICD-10-CM | POA: Diagnosis not present

## 2018-05-21 DIAGNOSIS — O3663X0 Maternal care for excessive fetal growth, third trimester, not applicable or unspecified: Secondary | ICD-10-CM | POA: Diagnosis not present

## 2018-05-28 DIAGNOSIS — Z23 Encounter for immunization: Secondary | ICD-10-CM | POA: Diagnosis not present

## 2018-06-03 DIAGNOSIS — Z3685 Encounter for antenatal screening for Streptococcus B: Secondary | ICD-10-CM | POA: Diagnosis not present

## 2018-06-03 NOTE — Progress Notes (Deleted)
37 y.o. G31P0030 Married White or Caucasian female here for annual exam.    No LMP recorded.          Sexually active: {yes no:314532}  The current method of family planning is {contraception:315051}.    Exercising: {yes no:314532}  {types:19826} Smoker:  {YES P5382123  Health Maintenance: Pap:  05/01/16 ASCUS. HR HPV:neg              04/29/15 ASCUS. HR HPV:neg  History of abnormal Pap:  Yes, HPV + 03/2014  HR HPV + 06/04/17 MMG:  NA Colonoscopy:  NA BMD:   NA TDaP:  06/22/12 Pneumonia vaccine(s):  NA Shingrix:   NA Hep C testing:  Screening Labs: ***, Hb today: ***, Urine today: ***   reports that she has never smoked. She has never used smokeless tobacco. She reports that she drinks alcohol. She reports that she does not use drugs.  Past Medical History:  Diagnosis Date  . Abnormal Pap smear of cervix 2014, 03/2014   ASCUS, pos HR HPV, 18/45; 2015 LGSIL, + HR HPV  . Dermoid cyst of ovary, left 03/2016  . Fibroid   . Heart murmur   . History of gastric ulcer    remote hx - resolved  . History of kidney stones   . History of palpitations    hypersenitive to codeine and per pt sometimes tachy  . History of recurrent miscarriages   . 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 Infertility, female   . Migraine   . Nocturia   . PONV (postoperative nausea and vomiting)     Past Surgical History:  Procedure Laterality Date  . AUGMENTATION MAMMAPLASTY Bilateral 01/17/10   implants  . CHROMOPERTUBATION N/A 06/19/2016   Procedure: CHROMOPERTUBATION;  Surgeon: Governor Specking, MD;  Location[facility];  Service: Gynecology;  Laterality: N/A;  . COLPOSCOPY  2014, 2015   2015 LGSIL, neg ECC  . DILATION AND EVACUATION  12/30/2007   missed ab  . LAPAROSCOPIC GELPORT ASSISTED MYOMECTOMY N/A 06/19/2016   Procedure: LAPAROSCOPIC GELPORT ASSISTED MYOMECTOMY;  Surgeon: Governor Specking, MD;  Location: [place];  Service: Gynecology;  Laterality: N/A;  . LAPAROSCOPY      MYOMECTOMY  . OVARIAN CYST REMOVAL Left 06/19/2016   Procedure: LEFT OVARIAN CYSTECTOMY;  Surgeon: Governor Specking, MD;  Location: [place];  Service: Gynecology;  Laterality: Left;  . TRANSTHORACIC ECHOCARDIOGRAM  01/08/2014   ef 56%/  mild to moderater TR/  trivial PR    Current Outpatient Medications  Medication Sig Dispense Refill  . FOLIC ACID PO Take 4 mg by mouth daily.    Marland Kitchen letrozole (FEMARA) 2.5 MG tablet Take 1 tablet by mouth. Day 3-7 of cycle    . OVIDREL 250 MCG/0.5ML injection INJECT 1/2 MILLILITER UNDER THE SKIN ONCE FOR 1 DOSE  0  . Prenatal Vit-Fe Fumarate-FA (MULTIVITAMIN-PRENATAL) 27-0.8 MG TABS tablet Take 1 tablet daily at 12 noon by mouth.     No current facility-administered medications for this visit.     Family History  Problem Relation Age of Onset  . Thyroid disease Mother   . Hypertension Maternal Grandmother   . Thyroid disease Maternal Grandmother   . Heart attack Maternal Grandfather   . Cancer Maternal Grandfather        lymph nodes  . Cancer Paternal Grandmother        stomach  . Heart disease Paternal Grandfather        Psychologist, forensic  . Hypotension Paternal  Grandfather     Review of Systems  Exam:   There were no vitals taken for this visit.  Height:      Ht Readings from Last 3 Encounters:  06/04/17 5\' 5"  (1.651 m)  11/23/16 5\' 5"  (1.651 m)  07/17/16 5\' 4"  (1.626 m)    General appearance: alert, cooperative and appears stated age Head: Normocephalic, without obvious abnormality, atraumatic Neck: no adenopathy, supple, symmetrical, trachea midline and thyroid {EXAM; THYROID:18604} Lungs: clear to auscultation bilaterally Breasts: {Exam; breast:13139::"normal appearance, no masses or tenderness"} Heart: regular rate and rhythm Abdomen: soft, non-tender; bowel sounds normal; no masses,  no organomegaly Extremities: extremities normal, atraumatic, no cyanosis or edema Skin: Skin color, texture, turgor normal. No  rashes or lesions Lymph nodes: Cervical, supraclavicular, and axillary nodes normal. No abnormal inguinal nodes palpated Neurologic: Grossly normal   Pelvic: External genitalia:  no lesions              Urethra:  normal appearing urethra with no masses, tenderness or lesions              Bartholins and Skenes: normal                 Vagina: normal appearing vagina with normal color and discharge, no lesions              Cervix: {exam; cervix:14595}              Pap taken: {yes no:314532} Bimanual Exam:  Uterus:  {exam; uterus:12215}              Adnexa: {exam; adnexa:12223}               Rectovaginal: Confirms               Anus:  normal sphincter tone, no lesions  Chaperone was present for exam.  A:  Well Woman with normal exam  P:   {plan; gyn:5269::"mammogram","pap smear","return annually or prn"}

## 2018-06-04 ENCOUNTER — Encounter (HOSPITAL_COMMUNITY): Payer: Self-pay | Admitting: *Deleted

## 2018-06-05 ENCOUNTER — Encounter (HOSPITAL_COMMUNITY): Payer: Self-pay

## 2018-06-11 DIAGNOSIS — Z3A36 36 weeks gestation of pregnancy: Secondary | ICD-10-CM | POA: Diagnosis not present

## 2018-06-12 ENCOUNTER — Ambulatory Visit: Payer: Self-pay | Admitting: Obstetrics & Gynecology

## 2018-06-12 DIAGNOSIS — Z3A36 36 weeks gestation of pregnancy: Secondary | ICD-10-CM | POA: Diagnosis not present

## 2018-06-13 ENCOUNTER — Encounter (HOSPITAL_COMMUNITY)
Admission: RE | Admit: 2018-06-13 | Discharge: 2018-06-13 | Disposition: A | Discharge status: Home / Self Care | Payer: 59 | Source: Ambulatory Visit | Attending: Obstetrics and Gynecology | Admitting: Obstetrics and Gynecology

## 2018-06-13 HISTORY — DX: Gastro-esophageal reflux disease without esophagitis: K21.9

## 2018-06-13 HISTORY — DX: Supervision of elderly primigravida, unspecified trimester: O09.519

## 2018-06-13 LAB — CBC
HEMATOCRIT: 33.8 % — AB (ref 36.0–46.0)
HEMOGLOBIN: 11 g/dL — AB (ref 12.0–15.0)
MCH: 29.9 pg (ref 26.0–34.0)
MCHC: 32.5 g/dL (ref 30.0–36.0)
MCV: 91.8 fL (ref 80.0–100.0)
NRBC: 0.2 % (ref 0.0–0.2)
Platelets: 209 10*3/uL (ref 150–400)
RBC: 3.68 MIL/uL — ABNORMAL LOW (ref 3.87–5.11)
RDW: 14.7 % (ref 11.5–15.5)
WBC: 14.7 10*3/uL — AB (ref 4.0–10.5)

## 2018-06-13 LAB — ABO/RH: ABO/RH(D): O POS

## 2018-06-13 LAB — TYPE AND SCREEN
ABO/RH(D): O POS
ANTIBODY SCREEN: NEGATIVE

## 2018-06-13 NOTE — Patient Instructions (Signed)
DEDRA MATSUO  06/13/2018   Your procedure is scheduled on:  06/16/2018  Enter through the Main Entrance of [facility] at [place] up the phone at the desk and dial [phone]  Call this number if you have problems the morning of surgery:[phone]  Remember:   Do not eat food:(After Midnight) Desps de medianoche.  Do not drink clear liquids: (After Midnight) Desps de medianoche.  Take these medicines the morning of surgery with A SIP OF WATER: none   Do not wear jewelry, make-up or nail polish.  Do not wear lotions, powders, or perfumes. Do not wear deodorant.  Do not shave 48 hours prior to surgery.  Do not bring valuables to the hospital.  [facility] is not   responsible for any belongings or valuables brought to the hospital.  Contacts, dentures or bridgework may not be worn into surgery.  Leave suitcase in the car. After surgery it may be brought to your room.  For patients admitted to the hospital, checkout time is 11:00 AM the day of              discharge.    N/A   Please read over the following fact sheets that you were given:   Surgical Site Infection Prevention

## 2018-06-14 ENCOUNTER — Encounter (HOSPITAL_COMMUNITY): Payer: Self-pay | Admitting: Anesthesiology

## 2018-06-14 LAB — RPR: RPR Ser Ql: NONREACTIVE

## 2018-06-14 NOTE — Anesthesia Preprocedure Evaluation (Addendum)
Anesthesia Evaluation  Patient identified by MRN, date of birth, ID band Patient awake    Reviewed: Allergy & Precautions, NPO status , Patient's Chart, lab work & pertinent test results  History of Anesthesia Complications (+) PONV and history of anesthetic complications  Airway Mallampati: I  TM Distance: >3 FB Neck ROM: Full    Dental no notable dental hx. (+) Teeth Intact   Pulmonary neg pulmonary ROS,    Pulmonary exam normal breath sounds clear to auscultation       Cardiovascular Normal cardiovascular exam+ Valvular Problems/Murmurs  Rhythm:Regular Rate:Normal     Neuro/Psych  Headaches, negative psych ROS   GI/Hepatic GERD  Medicated and Controlled,  Endo/Other  negative endocrine ROS  Renal/GU Renal diseaseHx/o renal calculi  negative genitourinary   Musculoskeletal negative musculoskeletal ROS (+)   Abdominal   Peds  Hematology negative hematology ROS (+)   Anesthesia Other Findings   Reproductive/Obstetrics (+) Pregnancy Hx/o myomectomy Hx/o infertility HSV                            Anesthesia Physical Anesthesia Plan  ASA: II  Anesthesia Plan: Spinal   Post-op Pain Management:    Induction:   PONV Risk Score and Plan: 4 or greater and Scopolamine patch - Pre-op, Ondansetron, Dexamethasone and Treatment may vary due to age or medical condition  Airway Management Planned: Natural Airway  Additional Equipment:   Intra-op Plan:   Post-operative Plan:   Informed Consent: I have reviewed the patients History and Physical, chart, labs and discussed the procedure including the risks, benefits and alternatives for the proposed anesthesia with the patient or authorized representative who has indicated his/her understanding and acceptance.   Dental advisory given  Plan Discussed with: CRNA and Surgeon  Anesthesia Plan Comments:        Anesthesia Quick  Evaluation

## 2018-06-15 NOTE — H&P (Signed)
Casey Clarke is a 37 y.o. female presenting for primary csection for history of myomectomy with PTL. OB History    Gravida  4   Para  0   Term  0   Preterm  0   AB  3   Living  0     SAB  3   TAB  0   Ectopic  0   Multiple  0   Live Births  0          Past Medical History:  Diagnosis Date  . Abnormal Pap smear of cervix 2014, 03/2014   ASCUS, pos HR HPV, 18/45; 2015 LGSIL, + HR HPV  . AMA (advanced maternal age) primigravida 69+   . Dermoid cyst of ovary, left 03/2016  . Fibroid   . GERD (gastroesophageal reflux disease)   . Heart murmur   . History of gastric ulcer    remote hx - resolved  . History of kidney stones   . History of kidney stones   . History of palpitations    hypersenitive to codeine and per pt sometimes tachy  . History of recurrent miscarriages   . 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 Infertility, female   . Migraine   . Nocturia   . PONV (postoperative nausea and vomiting)    Past Surgical History:  Procedure Laterality Date  . AUGMENTATION MAMMAPLASTY Bilateral 01/17/10   implants  . CHROMOPERTUBATION N/A 06/19/2016   Procedure: CHROMOPERTUBATION;  Surgeon: Governor Specking, MD;  Location: [facility];  Service: Gynecology;  Laterality: N/A;  . COLPOSCOPY  2014, 2015   2015 LGSIL, neg ECC  . DILATION AND EVACUATION  12/30/2007   missed ab  . LAPAROSCOPIC GELPORT ASSISTED MYOMECTOMY N/A 06/19/2016   Procedure: LAPAROSCOPIC GELPORT ASSISTED MYOMECTOMY;  Surgeon: Governor Specking, MD;  Location: [place];  Service: Gynecology;  Laterality: N/A;  . LAPAROSCOPY     MYOMECTOMY  . OVARIAN CYST REMOVAL Left 06/19/2016   Procedure: LEFT OVARIAN CYSTECTOMY;  Surgeon: Governor Specking, MD;  Location: [place];  Service: Gynecology;  Laterality: Left;  . TRANSTHORACIC ECHOCARDIOGRAM  01/08/2014   ef 56%/  mild to moderater TR/  trivial PR   Family History: family history includes Cancer in her  maternal grandfather and paternal grandmother; Diabetes in her maternal aunt; Heart attack in her maternal grandfather; Heart disease in her paternal grandfather; Hypertension in her maternal grandmother and mother; Hypotension in her paternal grandfather; Kidney disease in her maternal grandmother; Thyroid disease in her maternal grandmother and mother. Social History:  reports that she has never smoked. She has never used smokeless tobacco. She reports that she drinks alcohol. She reports that she does not use drugs.     Maternal Diabetes: No Genetic Screening: Normal Maternal Ultrasounds/Referrals: Normal Fetal Ultrasounds or other Referrals:  None Maternal Substance Abuse:  No Significant Maternal Medications:  None Significant Maternal Lab Results:  None Other Comments:  None  Review of Systems  Constitutional: Negative.   All other systems reviewed and are negative.  Maternal Medical History:  Contractions: Onset was 1 week or more ago.   Frequency: rare and irregular.   Perceived severity is moderate.    Fetal activity: Perceived fetal activity is normal.   Last perceived fetal movement was within the past hour.    Prenatal Complications - Diabetes: none.      unknown if currently breastfeeding. Maternal Exam:  Uterine Assessment: Contraction strength is mild.  Contraction frequency is irregular.  Abdomen: Patient reports no abdominal tenderness. Surgical scars: low transverse.   Fetal presentation: vertex  Introitus: Normal vulva. Normal vagina.  Ferning test: not done.  Nitrazine test: not done. Amniotic fluid character: not assessed.  Pelvis: questionable for delivery.   Cervix: Cervix evaluated by digital exam.     Physical Exam  Nursing note and vitals reviewed. Constitutional: She is oriented to person, place, and time. She appears well-developed and well-nourished.  HENT:  Head: Normocephalic and atraumatic.  Neck: Normal range of motion. Neck supple.   Cardiovascular: Normal rate and regular rhythm.  Respiratory: Effort normal and breath sounds normal.  GI: Soft. Bowel sounds are normal.  Genitourinary: Vagina normal and uterus normal.  Musculoskeletal: Normal range of motion.  Neurological: She is alert and oriented to person, place, and time. She has normal reflexes.  Skin: Skin is warm and dry.  Psychiatric: She has a normal mood and affect.    Prenatal labs: ABO, Rh: --/--/O POS, O POS Performed at [facility], [address] [place], [place] [address]  [phone]) Antibody: NEG (11/22 0945) Rubella: Immune (05/16 0000) RPR: Non Reactive (11/22 0945)  HBsAg: Negative (05/16 0000)  HIV: Non-reactive (05/16 0000)  GBS:     Assessment/Plan: 36+ week IUP Previous myomectomy History of PTL Primary csection. Consent done.   Christopher Glasscock J 06/15/2018, 10:24 PM

## 2018-06-16 ENCOUNTER — Inpatient Hospital Stay (HOSPITAL_COMMUNITY)
Admission: AD | Admit: 2018-06-16 | Discharge: 2018-06-18 | DRG: 787 | Disposition: A | Discharge status: Home / Self Care | Payer: 59 | Attending: Obstetrics and Gynecology | Admitting: Obstetrics and Gynecology

## 2018-06-16 ENCOUNTER — Encounter (HOSPITAL_COMMUNITY): Payer: Self-pay | Admitting: General Practice

## 2018-06-16 ENCOUNTER — Inpatient Hospital Stay (HOSPITAL_COMMUNITY): Payer: 59 | Admitting: Anesthesiology

## 2018-06-16 ENCOUNTER — Encounter (HOSPITAL_COMMUNITY)
Admission: AD | Disposition: A | Discharge status: Home / Self Care | Payer: Self-pay | Source: Home / Self Care | Attending: Obstetrics and Gynecology

## 2018-06-16 DIAGNOSIS — O3429 Maternal care due to uterine scar from other previous surgery: Secondary | ICD-10-CM

## 2018-06-16 DIAGNOSIS — Z9889 Other specified postprocedural states: Secondary | ICD-10-CM

## 2018-06-16 DIAGNOSIS — D62 Acute posthemorrhagic anemia: Secondary | ICD-10-CM | POA: Diagnosis not present

## 2018-06-16 DIAGNOSIS — Z98891 History of uterine scar from previous surgery: Secondary | ICD-10-CM

## 2018-06-16 DIAGNOSIS — O9081 Anemia of the puerperium: Secondary | ICD-10-CM | POA: Diagnosis not present

## 2018-06-16 DIAGNOSIS — Z3A36 36 weeks gestation of pregnancy: Secondary | ICD-10-CM | POA: Diagnosis not present

## 2018-06-16 DIAGNOSIS — Z3A37 37 weeks gestation of pregnancy: Secondary | ICD-10-CM | POA: Diagnosis not present

## 2018-06-16 DIAGNOSIS — Z3A00 Weeks of gestation of pregnancy not specified: Secondary | ICD-10-CM | POA: Diagnosis not present

## 2018-06-16 HISTORY — DX: History of uterine scar from previous surgery: Z98.891

## 2018-06-16 HISTORY — DX: Maternal care due to uterine scar from other previous surgery: O34.29

## 2018-06-16 HISTORY — DX: Other specified postprocedural states: Z98.890

## 2018-06-16 SURGERY — Surgical Case
Anesthesia: Spinal | Site: Abdomen | Wound class: Clean Contaminated

## 2018-06-16 MED ORDER — GLYCOPYRROLATE 0.2 MG/ML IJ SOLN
INTRAMUSCULAR | Status: DC | PRN
  Administered 2018-06-16: 0.2 mg via INTRAVENOUS

## 2018-06-16 MED ORDER — DEXAMETHASONE SODIUM PHOSPHATE 4 MG/ML IJ SOLN
INTRAMUSCULAR | Status: AC
  Filled 2018-06-16: qty 1

## 2018-06-16 MED ORDER — CEFAZOLIN SODIUM-DEXTROSE 2-4 GM/100ML-% IV SOLN
2.0000 g | INTRAVENOUS | Status: AC
  Administered 2018-06-16: 2 g via INTRAVENOUS
  Filled 2018-06-16: qty 100

## 2018-06-16 MED ORDER — OXYTOCIN 10 UNIT/ML IJ SOLN
INTRAVENOUS | Status: DC | PRN
  Administered 2018-06-16: 40 [IU] via INTRAVENOUS

## 2018-06-16 MED ORDER — TRANEXAMIC ACID-NACL 1000-0.7 MG/100ML-% IV SOLN
1000.0000 mg | INTRAVENOUS | Status: AC
  Administered 2018-06-16: 1000 mg via INTRAVENOUS
  Filled 2018-06-16: qty 100

## 2018-06-16 MED ORDER — WITCH HAZEL-GLYCERIN EX PADS: 1.0000 "application " | MEDICATED_PAD | CUTANEOUS | Status: DC | PRN

## 2018-06-16 MED ORDER — TETANUS-DIPHTH-ACELL PERTUSSIS 5-2.5-18.5 LF-MCG/0.5 IM SUSP: 0.5000 mL | Freq: Once | INTRAMUSCULAR | Status: DC

## 2018-06-16 MED ORDER — SCOPOLAMINE 1 MG/3DAYS TD PT72: 1.0000 | MEDICATED_PATCH | TRANSDERMAL | Status: DC

## 2018-06-16 MED ORDER — MENTHOL 3 MG MT LOZG: 1.0000 | LOZENGE | OROMUCOSAL | Status: DC | PRN

## 2018-06-16 MED ORDER — IBUPROFEN 600 MG PO TABS
600.0000 mg | ORAL_TABLET | Freq: Four times a day (QID) | ORAL | Status: DC
  Administered 2018-06-16 – 2018-06-18 (×9): 600 mg via ORAL
  Filled 2018-06-16 (×9): qty 1

## 2018-06-16 MED ORDER — PHENYLEPHRINE 8 MG IN D5W 100 ML (0.08MG/ML) PREMIX OPTIME
INJECTION | INTRAVENOUS | Status: DC | PRN
  Administered 2018-06-16: 60 ug/min via INTRAVENOUS

## 2018-06-16 MED ORDER — METHYLERGONOVINE MALEATE 0.2 MG/ML IJ SOLN: 0.2000 mg | INTRAMUSCULAR | Status: DC | PRN

## 2018-06-16 MED ORDER — MORPHINE SULFATE (PF) 0.5 MG/ML IJ SOLN
INTRAMUSCULAR | Status: DC | PRN
  Administered 2018-06-16: .15 mg via INTRATHECAL

## 2018-06-16 MED ORDER — DEXAMETHASONE SODIUM PHOSPHATE 4 MG/ML IJ SOLN
INTRAMUSCULAR | Status: DC | PRN
  Administered 2018-06-16: 4 mg via INTRAVENOUS

## 2018-06-16 MED ORDER — DIPHENHYDRAMINE HCL 25 MG PO CAPS: 25.0000 mg | ORAL_CAPSULE | Freq: Four times a day (QID) | ORAL | Status: DC | PRN

## 2018-06-16 MED ORDER — ONDANSETRON HCL 4 MG/2ML IJ SOLN
INTRAMUSCULAR | Status: DC | PRN
  Administered 2018-06-16: 4 mg via INTRAVENOUS

## 2018-06-16 MED ORDER — FENTANYL CITRATE (PF) 100 MCG/2ML IJ SOLN
INTRAMUSCULAR | Status: AC
  Filled 2018-06-16: qty 2

## 2018-06-16 MED ORDER — SCOPOLAMINE 1 MG/3DAYS TD PT72: 1.0000 | MEDICATED_PATCH | Freq: Once | TRANSDERMAL | Status: DC

## 2018-06-16 MED ORDER — METHYLERGONOVINE MALEATE 0.2 MG PO TABS: 0.2000 mg | ORAL_TABLET | ORAL | Status: DC | PRN

## 2018-06-16 MED ORDER — OXYTOCIN 40 UNITS IN LACTATED RINGERS INFUSION - SIMPLE MED: 2.5000 [IU]/h | INTRAVENOUS | Status: DC

## 2018-06-16 MED ORDER — MORPHINE SULFATE (PF) 0.5 MG/ML IJ SOLN
INTRAMUSCULAR | Status: AC
  Filled 2018-06-16: qty 10

## 2018-06-16 MED ORDER — LACTATED RINGERS IV SOLN
INTRAVENOUS | Status: DC
  Administered 2018-06-16: 07:00:00 via INTRAVENOUS

## 2018-06-16 MED ORDER — ZOLPIDEM TARTRATE 5 MG PO TABS: 5.0000 mg | ORAL_TABLET | Freq: Every evening | ORAL | Status: DC | PRN

## 2018-06-16 MED ORDER — BUPIVACAINE IN DEXTROSE 0.75-8.25 % IT SOLN
INTRATHECAL | Status: DC | PRN
  Administered 2018-06-16: 1.6 mL via INTRATHECAL

## 2018-06-16 MED ORDER — PHENYLEPHRINE 8 MG IN D5W 100 ML (0.08MG/ML) PREMIX OPTIME
INJECTION | INTRAVENOUS | Status: AC
  Filled 2018-06-16: qty 100

## 2018-06-16 MED ORDER — COCONUT OIL OIL
1.0000 "application " | TOPICAL_OIL | Status: DC | PRN
  Administered 2018-06-16: 1 via TOPICAL
  Filled 2018-06-16: qty 120

## 2018-06-16 MED ORDER — BUPIVACAINE HCL (PF) 0.25 % IJ SOLN
INTRAMUSCULAR | Status: AC
  Filled 2018-06-16: qty 30

## 2018-06-16 MED ORDER — FENTANYL CITRATE (PF) 100 MCG/2ML IJ SOLN
INTRAMUSCULAR | Status: DC | PRN
  Administered 2018-06-16: 15 ug via INTRATHECAL

## 2018-06-16 MED ORDER — SIMETHICONE 80 MG PO CHEW
80.0000 mg | CHEWABLE_TABLET | ORAL | Status: DC
  Administered 2018-06-16 – 2018-06-17 (×2): 80 mg via ORAL
  Filled 2018-06-16 (×2): qty 1

## 2018-06-16 MED ORDER — SCOPOLAMINE 1 MG/3DAYS TD PT72
MEDICATED_PATCH | TRANSDERMAL | Status: AC
  Administered 2018-06-16: 1.5 mg
  Filled 2018-06-16: qty 1

## 2018-06-16 MED ORDER — OXYTOCIN 10 UNIT/ML IJ SOLN
INTRAMUSCULAR | Status: AC
  Filled 2018-06-16: qty 4

## 2018-06-16 MED ORDER — SODIUM CHLORIDE 0.9 % IR SOLN
Status: DC | PRN
  Administered 2018-06-16: 1

## 2018-06-16 MED ORDER — ACETAMINOPHEN 325 MG PO TABS: 650.0000 mg | ORAL_TABLET | ORAL | Status: DC | PRN

## 2018-06-16 MED ORDER — SIMETHICONE 80 MG PO CHEW: 80.0000 mg | CHEWABLE_TABLET | ORAL | Status: DC | PRN

## 2018-06-16 MED ORDER — ONDANSETRON HCL 4 MG/2ML IJ SOLN
INTRAMUSCULAR | Status: AC
  Filled 2018-06-16: qty 2

## 2018-06-16 MED ORDER — SOD CITRATE-CITRIC ACID 500-334 MG/5ML PO SOLN
ORAL | Status: AC
  Administered 2018-06-16: 30 mL
  Filled 2018-06-16: qty 15

## 2018-06-16 MED ORDER — DIPHENHYDRAMINE HCL 50 MG/ML IJ SOLN
INTRAMUSCULAR | Status: DC | PRN
  Administered 2018-06-16: 12.5 mg via INTRAVENOUS

## 2018-06-16 MED ORDER — LACTATED RINGERS IV SOLN
INTRAVENOUS | Status: DC
  Administered 2018-06-16 (×2): via INTRAVENOUS

## 2018-06-16 MED ORDER — SIMETHICONE 80 MG PO CHEW
80.0000 mg | CHEWABLE_TABLET | Freq: Three times a day (TID) | ORAL | Status: DC
  Administered 2018-06-16 – 2018-06-18 (×5): 80 mg via ORAL
  Filled 2018-06-16 (×6): qty 1

## 2018-06-16 MED ORDER — SOD CITRATE-CITRIC ACID 500-334 MG/5ML PO SOLN: 30.0000 mL | Freq: Once | ORAL | Status: DC

## 2018-06-16 MED ORDER — SENNOSIDES-DOCUSATE SODIUM 8.6-50 MG PO TABS
2.0000 | ORAL_TABLET | ORAL | Status: DC
  Administered 2018-06-16 – 2018-06-17 (×2): 2 via ORAL
  Filled 2018-06-16 (×2): qty 2

## 2018-06-16 MED ORDER — DIBUCAINE 1 % RE OINT: 1.0000 "application " | TOPICAL_OINTMENT | RECTAL | Status: DC | PRN

## 2018-06-16 MED ORDER — PRENATAL MULTIVITAMIN CH
1.0000 | ORAL_TABLET | Freq: Every day | ORAL | Status: DC
  Administered 2018-06-18: 1 via ORAL
  Filled 2018-06-16 (×2): qty 1

## 2018-06-16 MED ORDER — FENTANYL CITRATE (PF) 100 MCG/2ML IJ SOLN: 25.0000 ug | INTRAMUSCULAR | Status: DC | PRN

## 2018-06-16 MED ORDER — LACTATED RINGERS IV SOLN
INTRAVENOUS | Status: DC
  Administered 2018-06-16: 18:00:00 via INTRAVENOUS

## 2018-06-16 MED ORDER — SODIUM CHLORIDE (PF) 0.9 % IJ SOLN
INTRAMUSCULAR | Status: AC
  Filled 2018-06-16: qty 20

## 2018-06-16 SURGICAL SUPPLY — 41 items
ADH SKN CLS APL DERMABOND .7 (GAUZE/BANDAGES/DRESSINGS) ×1
APL SKNCLS STERI-STRIP NONHPOA (GAUZE/BANDAGES/DRESSINGS) ×1
BENZOIN TINCTURE PRP APPL 2/3 (GAUZE/BANDAGES/DRESSINGS) ×2 IMPLANT
CHLORAPREP W/TINT 26ML (MISCELLANEOUS) ×3 IMPLANT
CLAMP CORD UMBIL (MISCELLANEOUS) IMPLANT
CLOSURE STERI STRIP 1/2 X4 (GAUZE/BANDAGES/DRESSINGS) ×2 IMPLANT
CLOTH BEACON ORANGE TIMEOUT ST (SAFETY) ×3 IMPLANT
DERMABOND ADVANCED (GAUZE/BANDAGES/DRESSINGS) ×2
DERMABOND ADVANCED .7 DNX12 (GAUZE/BANDAGES/DRESSINGS) IMPLANT
DRSG OPSITE POSTOP 4X10 (GAUZE/BANDAGES/DRESSINGS) ×3 IMPLANT
ELECT REM PT RETURN 9FT ADLT (ELECTROSURGICAL) ×3
ELECTRODE REM PT RTRN 9FT ADLT (ELECTROSURGICAL) ×1 IMPLANT
EXTRACTOR VACUUM M CUP 4 TUBE (SUCTIONS) IMPLANT
EXTRACTOR VACUUM M CUP 4' TUBE (SUCTIONS)
GLOVE BIO SURGEON STRL SZ7.5 (GLOVE) ×3 IMPLANT
GLOVE BIOGEL PI IND STRL 7.0 (GLOVE) ×1 IMPLANT
GLOVE BIOGEL PI INDICATOR 7.0 (GLOVE) ×2
GOWN STRL REUS W/TWL LRG LVL3 (GOWN DISPOSABLE) ×6 IMPLANT
KIT ABG SYR 3ML LUER SLIP (SYRINGE) IMPLANT
NDL HYPO 25X5/8 SAFETYGLIDE (NEEDLE) IMPLANT
NDL SPNL 20GX3.5 QUINCKE YW (NEEDLE) IMPLANT
NEEDLE HYPO 22GX1.5 SAFETY (NEEDLE) ×3 IMPLANT
NEEDLE HYPO 25X5/8 SAFETYGLIDE (NEEDLE) IMPLANT
NEEDLE SPNL 20GX3.5 QUINCKE YW (NEEDLE) IMPLANT
NS IRRIG 1000ML POUR BTL (IV SOLUTION) ×3 IMPLANT
PACK C SECTION WH (CUSTOM PROCEDURE TRAY) ×3 IMPLANT
PENCIL SMOKE EVAC W/HOLSTER (ELECTROSURGICAL) ×3 IMPLANT
SPONGE LAP 18X18 RF (DISPOSABLE) ×9 IMPLANT
SUT MNCRL 0 VIOLET CTX 36 (SUTURE) ×2 IMPLANT
SUT MNCRL AB 3-0 PS2 27 (SUTURE) ×2 IMPLANT
SUT MON AB 2-0 CT1 27 (SUTURE) ×3 IMPLANT
SUT MON AB-0 CT1 36 (SUTURE) ×6 IMPLANT
SUT MONOCRYL 0 CTX 36 (SUTURE) ×4
SUT PLAIN 0 NONE (SUTURE) IMPLANT
SUT PLAIN 2 0 (SUTURE) ×3
SUT PLAIN 2 0 XLH (SUTURE) IMPLANT
SUT PLAIN ABS 2-0 CT1 27XMFL (SUTURE) IMPLANT
SYR 20CC LL (SYRINGE) IMPLANT
SYR CONTROL 10ML LL (SYRINGE) ×3 IMPLANT
TOWEL OR 17X24 6PK STRL BLUE (TOWEL DISPOSABLE) ×3 IMPLANT
TRAY FOLEY W/BAG SLVR 14FR LF (SET/KITS/TRAYS/PACK) ×3 IMPLANT

## 2018-06-16 NOTE — Anesthesia Postprocedure Evaluation (Signed)
Anesthesia Post Note  Patient: Casey Clarke  Procedure(s) Performed: Primary CESAREAN SECTION (N/A Abdomen)     Patient location during evaluation: Mother Baby Anesthesia Type: Spinal Level of consciousness: awake and alert Pain management: pain level controlled Vital Signs Assessment: post-procedure vital signs reviewed and stable Respiratory status: spontaneous breathing, nonlabored ventilation and respiratory function stable Cardiovascular status: stable Postop Assessment: no headache, no backache, spinal receding, able to ambulate, adequate PO intake, no apparent nausea or vomiting and patient able to bend at knees Anesthetic complications: no    Last Vitals:  Vitals:   06/16/18 1200 06/16/18 1345  BP: 117/72 114/67  Pulse: 64 63  Resp: 18 18  Temp: 36.9 C 36.9 C  SpO2: 96% 96%    Last Pain:  Vitals:   06/16/18 1345  TempSrc: Oral  PainSc:    Pain Goal: Patients Stated Pain Goal: 0 (06/16/18 0546)               Jabier Mutton

## 2018-06-16 NOTE — Anesthesia Procedure Notes (Signed)
Spinal  Patient location during procedure: OR Start time: 06/16/2018 7:32 AM End time: 06/16/2018 7:37 AM Staffing Anesthesiologist: Josephine Igo, MD Performed: anesthesiologist  Preanesthetic Checklist Completed: patient identified, site marked, surgical consent, pre-op evaluation, timeout performed, IV checked, risks and benefits discussed and monitors and equipment checked Spinal Block Patient position: sitting Prep: site prepped and draped and DuraPrep Patient monitoring: heart rate, cardiac monitor, continuous pulse ox and blood pressure Approach: midline Location: L4-5 Injection technique: single-shot Needle Needle type: Pencan  Needle gauge: 24 G Needle length: 9 cm Needle insertion depth: 6 cm Assessment Sensory level: T4 Additional Notes Patient tolerated procedure well. Adequate sensory level.

## 2018-06-16 NOTE — Addendum Note (Signed)
Addendum  created 06/16/18 1412 by Hewitt Blade, CRNA   Sign clinical note

## 2018-06-16 NOTE — Transfer of Care (Signed)
Immediate Anesthesia Transfer of Care Note  Patient: Casey Clarke  Procedure(s) Performed: Primary CESAREAN SECTION (N/A Abdomen)  Patient Location: PACU  Anesthesia Type:Spinal  Level of Consciousness: awake, alert  and oriented  Airway & Oxygen Therapy: Patient Spontanous Breathing  Post-op Assessment: Report given to RN and Post -op Vital signs reviewed and stable  Post vital signs: Reviewed and stable  Last Vitals:  Vitals Value Taken Time  BP 118/71 06/16/2018  8:46 AM  Temp    Pulse 93 06/16/2018  8:48 AM  Resp 16 06/16/2018  8:48 AM  SpO2 100 % 06/16/2018  8:48 AM  Vitals shown include unvalidated device data.  Last Pain:  Vitals:   06/16/18 0546  TempSrc: Oral  PainSc: 0-No pain      Patients Stated Pain Goal: 0 (32/12/24 8250)  Complications: No apparent anesthesia complications

## 2018-06-16 NOTE — Lactation Note (Signed)
This note was copied from a baby's chart. Lactation Consultation Note  Patient Name: Casey Clarke [id number] Date: 06/16/2018 Reason for consult: Initial assessment;Early term 37-38.6wks   P1, 24 6 hours old.  37 weeks.  Mother has history of implants.  Unable to determine type of surgery due to visitors in room. Reviewed hand expression w/ drop expressed.  Mother has small flat nipples.  Attempted breastfeeding but baby has not sustained latch since birth. Mother requested a nipple shield.  Applied #[id number] and baby latched for 10 min. Set up DEBP. Recommend mother post pump 4-6 times per day for 10-20 min with DEBP on initiation setting or q 3 hours if he continues to have difficulty latching. Give baby back volume pumped at the next feeding.  Reviewed how to spoon feed. Reviewed cleaning and milk storage.  Discussed LPI feeding plan.  Parents may need review to do visitors in room. Mom encouraged to feed baby 8-12 times/24 hours and with feeding cues at least q 3 hours.  Discussed with parents and RN the potential need to supplement with formula depending on infant's next feeding.  Left lactation brochure with resources in room.      Maternal Data Has patient been taught Hand Expression?: Yes Does the patient have breastfeeding experience prior to this delivery?: No  Feeding Feeding Type: Breast Fed  LATCH Score Latch: Repeated attempts needed to sustain latch, nipple held in mouth throughout feeding, stimulation needed to elicit sucking reflex.  Audible Swallowing: A few with stimulation  Type of Nipple: Flat  Comfort (Breast/Nipple): Soft / non-tender  Hold (Positioning): Assistance needed to correctly position infant at breast and maintain latch.  LATCH Score: 6  Interventions Interventions: Breast feeding basics reviewed;Assisted with latch;Skin to skin;Hand express;Pre-pump if needed;Support pillows;Adjust position;DEBP;Hand pump  Lactation Tools  Discussed/Used Tools: Nipple Shields Nipple shield size: 20 Pump Review: Setup, frequency, and cleaning;Milk Storage Initiated by:: Vivianne Master RN IBCLC Date initiated:: 06/16/18   Consult Status Consult Status: Follow-up Date: 06/17/18 Follow-up type: In-patient    Vivianne Master [facility] 06/16/2018, 2:00 PM

## 2018-06-16 NOTE — Anesthesia Postprocedure Evaluation (Signed)
Anesthesia Post Note  Patient: Priya S Lanagan  Procedure(s) Performed: Primary CESAREAN SECTION (N/A Abdomen)     Patient location during evaluation: PACU Anesthesia Type: Spinal Level of consciousness: oriented and awake and alert Pain management: pain level controlled Vital Signs Assessment: post-procedure vital signs reviewed and stable Respiratory status: spontaneous breathing, respiratory function stable and nonlabored ventilation Cardiovascular status: blood pressure returned to baseline and stable Postop Assessment: no headache, no backache, no apparent nausea or vomiting and spinal receding Anesthetic complications: no    Last Vitals:  Vitals:   06/16/18 0930 06/16/18 0945  BP: 102/81 109/71  Pulse: 90 68  Resp: 14 14  Temp:    SpO2: 100% 100%    Last Pain:  Vitals:   06/16/18 0945  TempSrc:   PainSc: 0-No pain   Pain Goal: Patients Stated Pain Goal: 0 (06/16/18 0546)               Rjay Revolorio A.

## 2018-06-16 NOTE — Op Note (Signed)
Cesarean Section Procedure Note  Indications: previous myomectomy  Pre-operative Diagnosis: 37 week 0 day pregnancy.  Post-operative Diagnosis: same  Surgeon: Lovenia Kim   Assistants: Benjie Karvonen, CNM  Anesthesia: Local anesthesia 0.25.% bupivacaine and Spinal anesthesia  ASA Class: 2  Procedure Details  The patient was seen in the Holding Room. The risks, benefits, complications, treatment options, and expected outcomes were discussed with the patient.  The patient concurred with the proposed plan, giving informed consent. The risks of anesthesia, infection, bleeding and possible injury to other organs discussed. Injury to bowel, bladder, or ureter with possible need for repair discussed. Possible need for transfusion with secondary risks of hepatitis or HIV acquisition discussed. Post operative complications to include but not limited to DVT, PE and Pneumonia noted. The site of surgery properly noted/marked. The patient was taken to Operating Room # 9, identified as Casey Clarke and the procedure verified as C-Section Delivery. A Time Out was held and the above information confirmed.  After induction of anesthesia, the patient was draped and prepped in the usual sterile manner. A Pfannenstiel incision was made and carried down through the subcutaneous tissue to the fascia. Fascial incision was made and extended transversely using Mayo scissors. The fascia was separated from the underlying rectus tissue superiorly and inferiorly. The peritoneum was identified and entered. Peritoneal incision was extended longitudinally. The utero-vesical peritoneal reflection was incised transversely and the bladder flap was bluntly freed from the lower uterine segment. A low transverse uterine incision(Kerr hysterotomy) was made. Delivered from OT presentation was a  female with Apgar scores of 9 at one minute and 9 at five minutes. Bulb suctioning gently performed. Neonatal team in attendance.After the  umbilical cord was clamped and cut cord blood was obtained for evaluation. The placenta was removed intact and appeared normal. The uterus was curetted with a dry lap pack. Good hemostasis was noted.The uterine outline, tubes and ovaries appeared normal. The uterine incision was closed with running locked sutures of 0 Monocryl x 2 layers. Hemostasis was observed. Lavage was carried out until clear.The parietal peritoneum was closed with a running 2-0 Monocryl suture. The fascia was then reapproximated with running sutures of 0 Monocryl. The skin was reapproximated with 3-0 monocryl after [place] closure with 2-0 plain.  Instrument, sponge, and needle counts were correct prior the abdominal closure and at the conclusion of the case.   Findings: FTLM, 9/9. Bladder flap adhesions. No fibroids.  Estimated Blood Loss:  200 mL         Drains: foley                 Specimens: placenta                 Complications:  None; patient tolerated the procedure well.         Disposition: PACU - hemodynamically stable.         Condition: stable  Attending Attestation: I performed the procedure.

## 2018-06-16 NOTE — Progress Notes (Signed)
Patient ID: Casey Clarke, female   DOB: June 03, 1981, 37 y.o.   MRN: [id number] Patient seen and examined. Consent witnessed and signed. No changes noted. Update completed. BP 133/85   Pulse 95   Temp (!) 97.5 F (36.4 C) (Oral)   Resp 18   Ht 5' 4.5" (1.638 m)   Wt 68.9 kg   SpO2 100%   BMI 25.65 kg/m   CBC    Component Value Date/Time   WBC 14.7 (H) 06/13/2018 0945   RBC 3.68 (L) 06/13/2018 0945   HGB 11.0 (L) 06/13/2018 0945   HGB 13.7 04/29/2015 1423   HCT 33.8 (L) 06/13/2018 0945   PLT 209 06/13/2018 0945   MCV 91.8 06/13/2018 0945   MCH 29.9 06/13/2018 0945   MCHC 32.5 06/13/2018 0945   RDW 14.7 06/13/2018 0945   LYMPHSABS 0.7 07/17/2016 0552   MONOABS 1.3 (H) 07/17/2016 0552   EOSABS 0.0 07/17/2016 0552   BASOSABS 0.0 07/17/2016 2620

## 2018-06-17 LAB — CBC
HCT: 32.9 % — ABNORMAL LOW (ref 36.0–46.0)
Hemoglobin: 10.9 g/dL — ABNORMAL LOW (ref 12.0–15.0)
MCH: 30 pg (ref 26.0–34.0)
MCHC: 33.1 g/dL (ref 30.0–36.0)
MCV: 90.6 fL (ref 80.0–100.0)
PLATELETS: 220 10*3/uL (ref 150–400)
RBC: 3.63 MIL/uL — AB (ref 3.87–5.11)
RDW: 14.5 % (ref 11.5–15.5)
WBC: 16.5 10*3/uL — ABNORMAL HIGH (ref 4.0–10.5)
nRBC: 0 % (ref 0.0–0.2)

## 2018-06-17 MED ORDER — MAGNESIUM OXIDE 400 (241.3 MG) MG PO TABS
400.0000 mg | ORAL_TABLET | Freq: Every day | ORAL | Status: DC
  Administered 2018-06-17 – 2018-06-18 (×2): 400 mg via ORAL
  Filled 2018-06-17 (×3): qty 1

## 2018-06-17 MED ORDER — POLYSACCHARIDE IRON COMPLEX 150 MG PO CAPS
150.0000 mg | ORAL_CAPSULE | Freq: Every day | ORAL | Status: DC
  Administered 2018-06-17 – 2018-06-18 (×2): 150 mg via ORAL
  Filled 2018-06-17 (×2): qty 1

## 2018-06-17 NOTE — Progress Notes (Signed)
MOB was referred for history of depression/anxiety. * Referral screened out by Clinical Social Worker because none of the following criteria appear to apply: ~ History of anxiety/depression during this pregnancy, or of post-partum depression following prior delivery. ~ Diagnosis of anxiety and/or depression within last 3 years OR * MOB's symptoms currently being treated with medication and/or therapy. Please contact the Clinical Social Worker if needs arise, by [facility] request, or if MOB scores greater than 9/yes to question 10 on Edinburgh Postpartum Depression Screen.  *CSW does not note any history of anxiety/depression in patients records. Please re consult CSW for any other needs.   Kingsley Spittle, [place]  [phone]

## 2018-06-17 NOTE — Lactation Note (Addendum)
This note was copied from a baby's chart. Lactation Consultation Note  Patient Name: Casey Clarke [id number] Date: 06/17/2018 Reason for consult: Follow-up assessment;Primapara  Mom had breast implants (put over muscle) 10 yrs ago. Prior to implants, Mom reported that she looked "flat." Mom did report that with pregnancy, her breasts felt fuller, she had some soreness, & her areola became larger. Mom feels that her breasts feel heavier today.   Mom has used a nipple shield with infant (about 3 times today). She says infant will "latch" to nipple shield, but it seems that he is not swallowing when he's at the breast. I explained to Mom that we would likely need to supplement at the breast if she wanted him to be "drinking" at the breast.    Mom's nipple diameters are small & had been using size 27 flanges for pumping. After talking to Mom, it sounded as if too much areola was being pulled into flange. There was also marking on Mom's areola that corresponded with possible inadequate flange fit. I observed her pumping with the size 24 flange (lightly lubricated with coconut oil) with an eye for not having suction too high. Mom was comfortable with size 24 flanges. The hand-out from the CDC, "How to Keep Your Breast Pump Kit Clean," was provided to Mom. Dad was successfully able to return demonstration of washing/drying pump parts with instruction.   Infant was observed being bottle-fed with the Enfamil slow-flow nipple (teal collar). Infant had very fast swallows & was gulping even though he was inclined. Infant was switched to Similac slow-flow (yellow collar). Infant fed comfortably with this nipple, provided that he was held upright & paced bottle-feeding was done.  Matthias Hughs [facility] 06/17/2018, 5:14 PM

## 2018-06-17 NOTE — Progress Notes (Signed)
POSTOPERATIVE DAY # 1 S/P Primary LTCS for previous myomectomy, baby boy "Braxton"   S:         Reports feeling well overall, but tired and sore  C/o bottom still being numb, but denies weakness in legs or difficulty walking or using the bathroom             Tolerating po intake / no nausea / no vomiting / + flatus / no BM  Denies dizziness, SOB, or CP             Bleeding is light             Pain controlled with Motrin              Up ad lib / ambulatory/ voiding QS  Newborn breast feeding with formula supplementation - still working with latching / Circumcision - planning today    O:  VS: BP 119/72   Pulse (!) 54   Temp 97.7 F (36.5 C) (Oral)   Resp 18   Ht 5' 4.5" (1.638 m)   Wt 68.9 kg   SpO2 98%   Breastfeeding? Unknown   BMI 25.65 kg/m    LABS:               Recent Labs    06/17/18 0510  WBC 16.5*  HGB 10.9*  PLT 220               Bloodtype: --/--/O POS, O POS Performed at [facility], [address], [place], [place] [address]  [phone] 0945)  Rubella: Immune (05/16 0000)                                             I&O: Intake/Output      11/25 0701 - 11/26 0700   I.V. (mL/kg) 2062.5 (29.9)   Total Intake(mL/kg) 2062.5 (29.9)   Urine (mL/kg/hr) 4850 (2.9)   Blood 170   Total Output 5020   Net -2957.5                    Physical Exam:             Alert and Oriented X3  Lungs: Clear and unlabored  Heart: regular rate and rhythm / no murmurs  Abdomen: soft, non-tender, non-distended, active bowel sound in all quadrants             Fundus: firm, non-tender, U-1             Dressing: honeycomb with Dermabond c/d/i              Incision:  approximated with sutures / no erythema / no ecchymosis / no drainage  Perineum: intact  Lochia: small, no clots   Extremities: no edema, no calf pain or tenderness,   A:        POD # 1 S/P Primary LTCS             Mild ABL Anemia   Heterozygous MTHFR mutation   P:        Routine postoperative care     Niferex 150mg  PO daily  Magnesium oxide 400mg  PO daily  See lactation today  Encouraged to rest when baby rests  Continue current care   Lars Pinks, MSN, CNM [facility]

## 2018-06-18 ENCOUNTER — Other Ambulatory Visit: Payer: Self-pay

## 2018-06-18 MED ORDER — IBUPROFEN 600 MG PO TABS: 600.0000 mg | ORAL_TABLET | Freq: Four times a day (QID) | ORAL | 0 refills | Status: DC

## 2018-06-18 NOTE — Discharge Summary (Signed)
Obstetric Discharge Summary   Patient Name: Casey Clarke DOB: Jun 18, 1981 MRN: [id number]  Date of Admission: 06/16/2018 Date of Discharge: 06/18/2018 Date of Delivery: 06/16/18 Gestational Age at Delivery: [redacted]w[redacted]d  Primary OB: [facility] - Dr. Ronita Hipps  Antepartum complications:  - Hx. Of Myomectomy - PTL  Prenatal Labs:  ABO, Rh: --/--/O POS, O POS Performed at [facility], [address], [place], [place] [address]  [phone]) Antibody: NEG (11/22 0945) Rubella: Immune (05/16 0000) RPR: Non Reactive (11/22 0945)  HBsAg: Negative (05/16 0000)  HIV: Non-reactive (05/16 0000)  GBS:    Admitting Diagnosis: 37 weeks primary LTCS for hx of myomectomy   Secondary Diagnoses: Patient Active Problem List   Diagnosis Date Noted  . Pregnancy with history of uterine myomectomy 06/16/2018  . H/O myomectomy 06/16/2018  . S/P cesarean section 06/16/2018  . Postpartum care following cesarean delivery (11/25) 06/16/2018  . Preventative health care 11/23/2016  . Renal stone 11/23/2016  . Palpitations 03/11/2014    Date of Delivery: 06/18/18 Delivered By: Dr. Ronita Hipps Delivery Type: primary cesarean section, low transverse incision  Newborn Data: Live born female  Birth Weight: 6 lb 13.4 oz (3100 g) APGAR: 8, 9  Newborn Delivery   Birth date/time:  06/16/2018 07:57:00 Delivery type:  C-Section, Low Transverse Trial of labor:  No C-section categorization:  Primary        Hospital/Postpartum Course    (Cesarean Section):  Pt. Admitted for primary LTCS for hx of myomectomy and hx of preterm labor. Patient had an uncomplicated postpartum course.  By time of discharge on POD#2, her pain was controlled on oral pain medications; she had appropriate lochia and was ambulating, voiding without difficulty, tolerating regular diet and passing flatus.   She was deemed stable for discharge to home.     Labs: CBC Latest Ref Rng & Units 06/17/2018 06/13/2018 11/23/2016  WBC 4.0 -  10.5 K/uL 16.5(H) 14.7(H) 8.8  Hemoglobin 12.0 - 15.0 g/dL 10.9(L) 11.0(L) 14.3  Hematocrit 36.0 - 46.0 % 32.9(L) 33.8(L) 42.5  Platelets 150 - 400 K/uL 220 284 132.4   Conflict (See Lab Report): O POS/O POS Performed at [facility], [address], [place], [place] [address]   Physical exam:  BP 106/68 (BP Location: Left Arm)   Pulse 80   Temp 98.4 F (36.9 C) (Oral)   Resp 16   Ht 5' 4.5" (1.638 m)   Wt 68.9 kg   SpO2 97%   Breastfeeding? Unknown   BMI 25.65 kg/m  General: alert and no distress Pulm: normal respiratory effort Lochia: appropriate Abdomen: soft, NT Uterine Fundus: firm, below umbilicus Perineum: healing well, no significant erythema, no significant edema Incision: c/d/i, healing well, no significant drainage, no dehiscence, no significant erythema Extremities: No evidence of DVT seen on physical exam. No lower extremity edema.   Disposition: stable, discharge to home Baby Feeding: breast milk and formula Baby Disposition: home with mom  Contraception: not discussed  Rh Immune globulin given: N/A Rubella vaccine given: N/A Tdap vaccine given in AP or PP setting: UTD Flu vaccine given in AP or PP setting: UTD   Plan:  Casey Clarke was discharged to home in good condition. Follow-up appointment at [facility] in 6 weeks.  Discharge Instructions: Per After Visit Summary. Refer to After Visit Summary and [facility] discharge booklet  Activity: Advance as tolerated. Pelvic rest for 6 weeks.   Diet: Regular, Heart Healthy Discharge Medications: Allergies as of 06/18/2018  Reactions   Codeine Other (See Comments)   "palpitations and heart races"      Medication List    STOP taking these medications   famotidine 40 MG tablet Commonly known as:  PEPCID     TAKE these medications   aspirin EC 81 MG tablet Take 81 mg by mouth daily.   folic acid 1 MG tablet Commonly known as:  FOLVITE Take 4 mg by mouth daily.    ibuprofen 600 MG tablet Commonly known as:  ADVIL,MOTRIN Take 1 tablet (600 mg total) by mouth every 6 (six) hours.   multivitamin-prenatal 27-0.8 MG Tabs tablet Take 1 tablet by mouth daily.      Outpatient follow up:  Follow-up Information    [facility] Follow up.   Contact information: [place] [address] [phone]        Brien Few, MD. Schedule an appointment as soon as possible for a visit in 6 week(s).   Specialty:  Obstetrics and Gynecology Why:  Postpartum visit Contact information: [place] [address] [phone]           Signed:  Lars Pinks, MSN, CNM [facility]

## 2018-06-18 NOTE — Lactation Note (Addendum)
This note was copied from a baby's chart. Lactation Consultation Note  Patient Name: Casey Clarke [id number] Date: 06/18/2018 Reason for consult: Follow-up assessment;1st time breastfeeding;Breast augmentation   Follow up with Mom of 92 hour old infant. Infant with 10 bottle feeds of 15-35 ml, 11 voids and 7 stools in the last 24 hours. Infant weight 6 pounds 8.4 ounces with 5% weight loss since birth. Mom reports she is attempting to latch infant to the breast with little success. Mom reports she is pumping every 3 hours and hand expressing post supplementing, mom reports she is not getting any colostrum. Mom was offering a bottle when LC was in the room.   Enc mom to pump 8-12 x in 24 hours with DEBP and follow with hand expression. Enc mom to take a 5-6 hour stretch at night to sleep if she wants. Reviewed supply and demand and milk coming to volume.  Enc mom to put infant to the breast as she is able. Mom has Medela PIS at home. Enc mom to take all pump tubing home with her.   Reviewed I/O, signs of dehydration in the infant, signs infant is getting enough, engorgement prevention/treatment and breast milk expression and storage. [employer] Brochure reviewed, mom aware of OP services, BF Support Groups and [place] phone #. Mom reports she has no questions/concerns at this time. Mom reports she does not need any assistance today, mom reports all questions/concerns have been answered at this time.    Maternal Data Has patient been taught Hand Expression?: Yes Does the patient have breastfeeding experience prior to this delivery?: No  Feeding    LATCH Score                   Interventions    Lactation Tools Discussed/Used Pump Review: Setup, frequency, and cleaning;Milk Storage Initiated by:: Reviewed and encouraged 8-12 x in 24 hours and follow with hand expression   Consult Status Consult Status: Complete Follow-up type: Call as needed    Donn Pierini 06/18/2018, 11:17  AM

## 2018-06-18 NOTE — Progress Notes (Signed)
POSTOPERATIVE DAY # 2 S/P Primary LTCS for previous myomectomy, baby boy "Braxton"   S:         Reports feeling well overall, still tired and sore - desires early discharge home today              Numbness in bottom has resolved              Tolerating po intake / no nausea / no vomiting / + flatus /+ BM             Denies dizziness, SOB, or CP             Bleeding is light             Pain controlled with Motrin              Up ad lib / ambulatory/ voiding QS             Newborn breast feeding with formula supplementation - still working with latching / Circumcision - completed   O:        VS: BP 119/72   Pulse (!) 54   Temp 97.7 F (36.5 C) (Oral)   Resp 18   Ht 5' 4.5" (1.638 m)   Wt 68.9 kg   SpO2 98%   Breastfeeding? Unknown   BMI 25.65 kg/m               LABS:               RecentLabs(last2labs)  Recent Labs    06/17/18 0510  WBC 16.5*  HGB 10.9*  PLT 220                 Bloodtype: --/--/O POS, O POS Performed at [facility], [address], [place], [place] [address]  [phone])             Rubella: Immune (05/16 0000)                                                        I&O: Intake/Output      11/25 0701 - 11/26 0700   I.V. (mL/kg) 2062.5 (29.9)   Total Intake(mL/kg) 2062.5 (29.9)   Urine (mL/kg/hr) 4850 (2.9)   Blood 170   Total Output 5020   Net -2957.5                    Physical Exam:             Alert and Oriented X3             Lungs: Clear and unlabored             Heart: regular rate and rhythm / no murmurs             Abdomen: soft, non-tender, non-distended, active bowel sound in all quadrants             Fundus: firm, non-tender, U-2             Dressing: honeycomb with Dermabond c/d/i              Incision:  approximated with sutures / no erythema / no ecchymosis / no drainage             Perineum: intact  Lochia: small, no clots              Extremities: no edema, no calf pain or tenderness,   A:         POD # 2 S/P Primary LTCS             Mild ABL Anemia              Heterozygous MTHFR mutation   P:        Routine postoperative care              Declines narcotics for pain; advised to alternate Motrin with Tylenol 650mg  PO every 4hrs or Tylenol 1000mg  every 6 hours             Discharge home today                     [facility] discharge book given, instructions and warning s/s reviewed               F/u with Dr. Ronita Hipps in 6 weeks   Lars Pinks, MSN, [facility]

## 2018-06-26 DIAGNOSIS — R300 Dysuria: Secondary | ICD-10-CM | POA: Diagnosis not present

## 2018-06-26 DIAGNOSIS — R198 Other specified symptoms and signs involving the digestive system and abdomen: Secondary | ICD-10-CM | POA: Diagnosis not present

## 2018-06-26 DIAGNOSIS — K602 Anal fissure, unspecified: Secondary | ICD-10-CM | POA: Diagnosis not present

## 2018-07-13 ENCOUNTER — Emergency Department (HOSPITAL_COMMUNITY)
Admission: EM | Admit: 2018-07-13 | Discharge: 2018-07-14 | Disposition: A | Discharge status: Home / Self Care | Payer: 59 | Attending: Emergency Medicine | Admitting: Emergency Medicine

## 2018-07-13 ENCOUNTER — Encounter (HOSPITAL_COMMUNITY): Payer: Self-pay | Admitting: Emergency Medicine

## 2018-07-13 ENCOUNTER — Other Ambulatory Visit: Payer: Self-pay

## 2018-07-13 DIAGNOSIS — R1033 Periumbilical pain: Secondary | ICD-10-CM

## 2018-07-13 DIAGNOSIS — O0480 (Induced) termination of pregnancy with unspecified complications: Secondary | ICD-10-CM

## 2018-07-13 DIAGNOSIS — R1031 Right lower quadrant pain: Secondary | ICD-10-CM | POA: Diagnosis not present

## 2018-07-13 LAB — CBC
HEMATOCRIT: 46.1 % — AB (ref 36.0–46.0)
Hemoglobin: 15.3 g/dL — ABNORMAL HIGH (ref 12.0–15.0)
MCH: 30.1 pg (ref 26.0–34.0)
MCHC: 33.2 g/dL (ref 30.0–36.0)
MCV: 90.7 fL (ref 80.0–100.0)
Platelets: 360 10*3/uL (ref 150–400)
RBC: 5.08 MIL/uL (ref 3.87–5.11)
RDW: 13.2 % (ref 11.5–15.5)
WBC: 8.9 10*3/uL (ref 4.0–10.5)
nRBC: 0 % (ref 0.0–0.2)

## 2018-07-13 LAB — COMPREHENSIVE METABOLIC PANEL
ALT: 36 U/L (ref 0–44)
AST: 25 U/L (ref 15–41)
Albumin: 4.2 g/dL (ref 3.5–5.0)
Alkaline Phosphatase: 64 U/L (ref 38–126)
Anion gap: 10 (ref 5–15)
BUN: 14 mg/dL (ref 6–20)
CO2: 26 mmol/L (ref 22–32)
CREATININE: 0.8 mg/dL (ref 0.44–1.00)
Calcium: 9.6 mg/dL (ref 8.9–10.3)
Chloride: 106 mmol/L (ref 98–111)
GFR calc Af Amer: >60 mL/min (ref >60)
GFR calc non Af Amer: >60 mL/min (ref >60)
Glucose, Bld: 103 mg/dL — ABNORMAL HIGH (ref 70–99)
Potassium: 3.5 mmol/L (ref 3.5–5.1)
Sodium: 142 mmol/L (ref 135–145)
Total Bilirubin: 0.7 mg/dL (ref 0.3–1.2)
Total Protein: 6.8 g/dL (ref 6.5–8.1)

## 2018-07-13 LAB — DIFFERENTIAL
Basophils Absolute: 0 10*3/uL (ref 0.0–0.1)
Basophils Relative: 0 %
Eosinophils Absolute: 0.2 10*3/uL (ref 0.0–0.5)
Eosinophils Relative: 2 %
LYMPHS ABS: 1.4 10*3/uL (ref 0.7–4.0)
Lymphocytes Relative: 16 %
Monocytes Absolute: 0.3 10*3/uL (ref 0.1–1.0)
Monocytes Relative: 4 %
NEUTROS PCT: 78 %
Neutro Abs: 6.9 10*3/uL (ref 1.7–7.7)

## 2018-07-13 LAB — URINALYSIS, ROUTINE W REFLEX MICROSCOPIC
Bacteria, UA: NONE SEEN
Bilirubin Urine: NEGATIVE
Glucose, UA: NEGATIVE mg/dL
KETONES UR: NEGATIVE mg/dL
Leukocytes, UA: NEGATIVE
Nitrite: NEGATIVE
Protein, ur: NEGATIVE mg/dL
Specific Gravity, Urine: 1.003 — ABNORMAL LOW (ref 1.005–1.030)
pH: 6 (ref 5.0–8.0)

## 2018-07-13 LAB — I-STAT BETA HCG BLOOD, ED (MC, WL, AP ONLY): I-stat hCG, quantitative: <5 m[IU]/mL (ref <5)

## 2018-07-13 LAB — LIPASE, BLOOD: Lipase: 52 U/L — ABNORMAL HIGH (ref 11–51)

## 2018-07-13 MED ORDER — ONDANSETRON 8 MG PO TBDP
8.0000 mg | ORAL_TABLET | Freq: Once | ORAL | Status: AC
  Administered 2018-07-13: 8 mg via ORAL
  Filled 2018-07-13: qty 1

## 2018-07-13 NOTE — ED Triage Notes (Signed)
Patient is having lower abdominal pain. Patient states the pain comes random. Patient states she is 4 weeks post partum and is still bleeding. Patient states she vomited also.

## 2018-07-13 NOTE — ED Provider Notes (Signed)
[facility] DEPT Provider Note: Georgena Spurling, MD, FACEP  CSN: [id number] MRN: [id number] ARRIVAL: 07/13/18 at 2237 ROOM: WA10/WA10   CHIEF COMPLAINT  Abdominal Pain   HISTORY OF PRESENT ILLNESS  07/13/18 11:24 PM Casey Clarke is a 37 y.o. female who is about one month postpartum status post cesarean delivery.  She had an episode of lower abdominal pain about one week postpartum.  She describes this is excruciating and was evaluated by her OB/GYN.  No significant pathology was found and symptoms improved.  She is here with a recurrence of pain beginning about 8 PM this evening.  The pain began in her lower abdomen and was excruciating at its onset.  It has subsequently moved to the periumbilical region and is now described as a 5 out of 10.  It is dull in nature and worse with movement or deep breathing.  She had one episode of vomiting due to the pain.  She continues to have postpartum lochia without significant change.  She did pass one clot earlier.    Past Medical History:  Diagnosis Date  . Abnormal Pap smear of cervix 2014, 03/2014   ASCUS, pos HR HPV, 18/45; 2015 LGSIL, + HR HPV  . AMA (advanced maternal age) primigravida 6+   . Dermoid cyst of ovary, left 03/2016  . Fibroid   . GERD (gastroesophageal reflux disease)   . Heart murmur   . History of gastric ulcer    remote hx - resolved  . History of kidney stones   . History of palpitations    hypersenitive to codeine and per pt sometimes tachy  . History of recurrent miscarriages   . 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 Infertility, female   . Migraine   . Nocturia   . PONV (postoperative nausea and vomiting)     Past Surgical History:  Procedure Laterality Date  . AUGMENTATION MAMMAPLASTY Bilateral 01/17/10   implants  . CESAREAN SECTION N/A 06/16/2018   Procedure: Primary CESAREAN SECTION;  Surgeon: Brien Few, MD;  Location: [facility];  Service: Obstetrics;  Laterality: N/A;  EDD: 07/07/18 Allergy:  Codeine  . CHROMOPERTUBATION N/A 06/19/2016   Procedure: CHROMOPERTUBATION;  Surgeon: Governor Specking, MD;  Location: [facility];  Service: Gynecology;  Laterality: N/A;  . COLPOSCOPY  2014, 2015   2015 LGSIL, neg ECC  . DILATION AND EVACUATION  12/30/2007   missed ab  . LAPAROSCOPIC GELPORT ASSISTED MYOMECTOMY N/A 06/19/2016   Procedure: LAPAROSCOPIC GELPORT ASSISTED MYOMECTOMY;  Surgeon: Governor Specking, MD;  Location: [facility];  Service: Gynecology;  Laterality: N/A;  . LAPAROSCOPY     MYOMECTOMY  . OVARIAN CYST REMOVAL Left 06/19/2016   Procedure: LEFT OVARIAN CYSTECTOMY;  Surgeon: Governor Specking, MD;  Location: [facility];  Service: Gynecology;  Laterality: Left;  . TRANSTHORACIC ECHOCARDIOGRAM  01/08/2014   ef 56%/  mild to moderater TR/  trivial PR    Family History  Problem Relation Age of Onset  . Thyroid disease Mother   . Hypertension Mother   . Hypertension Maternal Grandmother   . Thyroid disease Maternal Grandmother   . Kidney disease Maternal Grandmother   . Heart attack Maternal Grandfather   . Cancer Maternal Grandfather        lymph nodes  . Cancer Paternal Grandmother        stomach  . Heart disease Paternal Grandfather        Psychologist, forensic  . Hypotension Paternal Grandfather   .  Diabetes Maternal Aunt     Social History   Tobacco Use  . Smoking status: Never Smoker  . Smokeless tobacco: Never Used  Substance Use Topics  . Alcohol use: Yes    Alcohol/week: 0.0 standard drinks    Comment: social  . Drug use: No    Prior to Admission medications   Medication Sig Start Date End Date Taking? Authorizing Provider  ibuprofen (ADVIL,MOTRIN) 600 MG tablet Take 1 tablet (600 mg total) by mouth every 6 (six) hours. Patient taking differently: Take 600 mg by mouth every 6 (six) hours as needed for headache, mild pain or moderate pain.  06/18/18  Yes Sigmon, Tyler Deis, CNM  Prenatal Vit-Fe Fumarate-FA  (MULTIVITAMIN-PRENATAL) 27-0.8 MG TABS tablet Take 1 tablet by mouth daily.    Yes [provider]    Allergies Codeine   REVIEW OF SYSTEMS  Negative except as noted here or in the History of Present Illness.   PHYSICAL EXAMINATION  Initial Vital Signs Blood pressure (!) 153/102, pulse 98, temperature 98.5 F (36.9 C), temperature source Oral, resp. rate 16, height 5\' 4"  (1.626 m), weight 62.9 kg, last menstrual period 06/16/2018, SpO2 99 %, unknown if currently breastfeeding.  Examination General: Well-developed, well-nourished female in no acute distress; appearance consistent with age of record HENT: normocephalic; atraumatic Eyes: pupils equal, round and reactive to light; extraocular muscles intact Neck: supple Heart: regular rate and rhythm Lungs: clear to auscultation bilaterally Abdomen: soft; nondistended; periumbilical tenderness; no masses or hepatosplenomegaly; bowel sounds present GU: Normal external genitalia; clot in cervical eyes, cervical loss closed; minimal cervical motion tenderness; mild uterine tenderness Extremities: No deformity; full range of motion; pulses normal Neurologic: Awake, alert and oriented; motor function intact in all extremities and symmetric; no facial droop Skin: Warm and dry Psychiatric: Normal mood and affect   RESULTS  Summary of this visit's results, reviewed by myself:   EKG Interpretation  Date/Time:    Ventricular Rate:    PR Interval:    QRS Duration:   QT Interval:    QTC Calculation:   R Axis:     Text Interpretation:        Laboratory Studies: Results for orders placed or performed during the hospital encounter of 07/13/18 (from the past 24 hour(s))  Urinalysis, Routine w reflex microscopic     Status: Abnormal   Collection Time: 07/13/18 10:48 PM  Result Value Ref Range   Color, Urine COLORLESS (A) YELLOW   APPearance CLEAR CLEAR   Specific Gravity, Urine 1.003 (L) 1.005 - 1.030   pH 6.0 5.0 - 8.0    Glucose, UA NEGATIVE NEGATIVE mg/dL   Hgb urine dipstick MODERATE (A) NEGATIVE   Bilirubin Urine NEGATIVE NEGATIVE   Ketones, ur NEGATIVE NEGATIVE mg/dL   Protein, ur NEGATIVE NEGATIVE mg/dL   Nitrite NEGATIVE NEGATIVE   Leukocytes, UA NEGATIVE NEGATIVE   RBC / HPF 0-5 0 - 5 RBC/hpf   WBC, UA 0-5 0 - 5 WBC/hpf   Bacteria, UA NONE SEEN NONE SEEN   Squamous Epithelial / LPF 0-5 0 - 5  Lipase, blood     Status: Abnormal   Collection Time: 07/13/18 10:56 PM  Result Value Ref Range   Lipase 52 (H) 11 - 51 U/L  Comprehensive metabolic panel     Status: Abnormal   Collection Time: 07/13/18 10:56 PM  Result Value Ref Range   Sodium 142 135 - 145 mmol/L   Potassium 3.5 3.5 - 5.1 mmol/L   Chloride 106 98 -  111 mmol/L   CO2 26 22 - 32 mmol/L   Glucose, Bld 103 (H) 70 - 99 mg/dL   BUN 14 6 - 20 mg/dL   Creatinine, Ser 0.80 0.44 - 1.00 mg/dL   Calcium 9.6 8.9 - 10.3 mg/dL   Total Protein 6.8 6.5 - 8.1 g/dL   Albumin 4.2 3.5 - 5.0 g/dL   AST 25 15 - 41 U/L   ALT 36 0 - 44 U/L   Alkaline Phosphatase 64 38 - 126 U/L   Total Bilirubin 0.7 0.3 - 1.2 mg/dL   GFR calc non Af Amer >60 >60 mL/min   GFR calc Af Amer >60 >60 mL/min   Anion gap 10 5 - 15  CBC     Status: Abnormal   Collection Time: 07/13/18 10:56 PM  Result Value Ref Range   WBC 8.9 4.0 - 10.5 K/uL   RBC 5.08 3.87 - 5.11 MIL/uL   Hemoglobin 15.3 (H) 12.0 - 15.0 g/dL   HCT 46.1 (H) 36.0 - 46.0 %   MCV 90.7 80.0 - 100.0 fL   MCH 30.1 26.0 - 34.0 pg   MCHC 33.2 30.0 - 36.0 g/dL   RDW 13.2 11.5 - 15.5 %   Platelets 360 150 - 400 K/uL   nRBC 0.0 0.0 - 0.2 %  Type and screen [place]     Status: None   Collection Time: 07/13/18 10:56 PM  Result Value Ref Range   ABO/RH(D) O POS    Antibody Screen NEG    Sample Expiration      07/16/2018 Performed at [facility], [place] [address] [place],  [address]   Differential     Status: None   Collection Time: 07/13/18 10:56 PM  Result  Value Ref Range   Neutrophils Relative % 78 %   Neutro Abs 6.9 1.7 - 7.7 K/uL   Lymphocytes Relative 16 %   Lymphs Abs 1.4 0.7 - 4.0 K/uL   Monocytes Relative 4 %   Monocytes Absolute 0.3 0.1 - 1.0 K/uL   Eosinophils Relative 2 %   Eosinophils Absolute 0.2 0.0 - 0.5 K/uL   Basophils Relative 0 %   Basophils Absolute 0.0 0.0 - 0.1 K/uL  I-Stat beta hCG blood, ED     Status: None   Collection Time: 07/13/18 11:04 PM  Result Value Ref Range   I-stat hCG, quantitative <5.0 <5 mIU/mL   Comment 3           Imaging Studies: US Pelvis Transvanginal Non-ob (tv Only)  Result Date: 07/14/2018 CLINICAL DATA:  Acute onset of right lower quadrant abdominal pain. EXAM: TRANSABDOMINAL AND TRANSVAGINAL ULTRASOUND OF PELVIS TECHNIQUE: Both transabdominal and transvaginal ultrasound examinations of the pelvis were performed. Transabdominal technique was performed for global imaging of the pelvis including uterus, ovaries, adnexal regions, and pelvic cul-de-sac. It was necessary to proceed with endovaginal exam following the transabdominal exam to visualize the uterus and ovaries in greater detail. COMPARISON:  CT of the abdomen and pelvis performed 07/15/2016 FINDINGS: Uterus Measurements: 10.4 x 6.4 x 7.0 cm = volume: 244 mL. No fibroids or other mass visualized. A small amount of complex free fluid is noted within the cervical canal. Endometrium Thickness: 0.7 cm.  No focal abnormality visualized. Right ovary Measurements: 3.5 x 2.3 x 1.8 cm = volume: 7.8 mL. Normal appearance/no adnexal mass. Left ovary Measurements: 2.7 x 1.6 x 1.8 cm = volume: 3.9 mL. Normal appearance/no adnexal mass. Other findings A small  amount of complex free fluid is seen within the pelvis, of uncertain significance. IMPRESSION: 1. No acute abnormality seen to explain the patient's symptoms. 2. Small amount of complex free fluid within the pelvis, of uncertain significance. The patient has a negative beta hCG. 3. Small amount of complex  free fluid in the cervical canal may reflect residual mild clot. Electronically Signed   By: Garald Balding M.D.   On: 07/14/2018 01:13   US Pelvic Complete With Transvaginal  Result Date: 07/14/2018 CLINICAL DATA:  Acute onset of right lower quadrant abdominal pain. EXAM: TRANSABDOMINAL AND TRANSVAGINAL ULTRASOUND OF PELVIS TECHNIQUE: Both transabdominal and transvaginal ultrasound examinations of the pelvis were performed. Transabdominal technique was performed for global imaging of the pelvis including uterus, ovaries, adnexal regions, and pelvic cul-de-sac. It was necessary to proceed with endovaginal exam following the transabdominal exam to visualize the uterus and ovaries in greater detail. COMPARISON:  CT of the abdomen and pelvis performed 07/15/2016 FINDINGS: Uterus Measurements: 10.4 x 6.4 x 7.0 cm = volume: 244 mL. No fibroids or other mass visualized. A small amount of complex free fluid is noted within the cervical canal. Endometrium Thickness: 0.7 cm.  No focal abnormality visualized. Right ovary Measurements: 3.5 x 2.3 x 1.8 cm = volume: 7.8 mL. Normal appearance/no adnexal mass. Left ovary Measurements: 2.7 x 1.6 x 1.8 cm = volume: 3.9 mL. Normal appearance/no adnexal mass. Other findings A small amount of complex free fluid is seen within the pelvis, of uncertain significance. IMPRESSION: 1. No acute abnormality seen to explain the patient's symptoms. 2. Small amount of complex free fluid within the pelvis, of uncertain significance. The patient has a negative beta hCG. 3. Small amount of complex free fluid in the cervical canal may reflect residual mild clot. Electronically Signed   By: Garald Balding M.D.   On: 07/14/2018 01:13    ED COURSE and MDM  Nursing notes and initial vitals signs, including pulse oximetry, reviewed.  Vitals:   07/13/18 2241 07/14/18 0014  BP: (!) 153/102 132/87  Pulse: 98 (!) 105  Resp: 16 17  Temp: 98.5 F (36.9 C)   TempSrc: Oral   SpO2: 99% 98%    Weight: 62.9 kg   Height: 5\' 4"  (1.626 m)    1:46 AM Patient advised of reassuring ultrasound.  The findings are consistent with the physical exam showing a clot being passed through the cervical loss.  A CT scan was recommended but the patient declined this.  I think this is reasonable given her improving pain but she was advised to return if pain is worsening or becomes localized, such as to the right lower quadrant.  PROCEDURES    ED DIAGNOSES     ICD-10-CM   1. Periumbilical abdominal pain R10.33      Shanon Rosser, MD 07/14/18 [phone]

## 2018-07-14 ENCOUNTER — Emergency Department (HOSPITAL_COMMUNITY): Payer: 59

## 2018-07-14 DIAGNOSIS — R1031 Right lower quadrant pain: Secondary | ICD-10-CM | POA: Diagnosis not present

## 2018-07-14 LAB — TYPE AND SCREEN
ABO/RH(D): O POS
Antibody Screen: NEGATIVE

## 2018-07-14 LAB — ABO/RH: ABO/RH(D): O POS

## 2018-07-14 NOTE — ED Notes (Signed)
Patient transported to US 

## 2018-08-08 ENCOUNTER — Telehealth: Payer: Self-pay

## 2018-08-08 NOTE — Telephone Encounter (Signed)
Patient in 08 recall [place] 06/12/17 showed changes only. Please call .

## 2018-08-12 NOTE — Telephone Encounter (Signed)
Patient has upcoming AEX appt 10/10/18.   Routed to [place], Therapist, sports

## 2018-08-13 NOTE — Telephone Encounter (Signed)
Patient had colpo 05-2017. Negative for dysplasia, HPV changes only.  Had C-section delivery 06-16-18. No recent pap noted in Epic.  Has annual exam scheduled for 10-10-2018.    Recall updated to 10-21-2018. Routing to Dr Sabra Heck. Encounter closed.

## 2018-08-21 ENCOUNTER — Ambulatory Visit: Payer: 59 | Admitting: Obstetrics & Gynecology

## 2018-10-07 ENCOUNTER — Other Ambulatory Visit: Payer: Self-pay

## 2018-10-07 ENCOUNTER — Ambulatory Visit (INDEPENDENT_AMBULATORY_CARE_PROVIDER_SITE_OTHER): Payer: 59 | Admitting: Obstetrics & Gynecology

## 2018-10-07 ENCOUNTER — Ambulatory Visit (INDEPENDENT_AMBULATORY_CARE_PROVIDER_SITE_OTHER): Payer: 59

## 2018-10-07 ENCOUNTER — Encounter: Payer: Self-pay | Admitting: Obstetrics & Gynecology

## 2018-10-07 ENCOUNTER — Ambulatory Visit: Payer: 59 | Admitting: Obstetrics & Gynecology

## 2018-10-07 VITALS — BP 120/86 | HR 92 | Resp 16 | Ht 64.5 in | Wt 132.8 lb

## 2018-10-07 DIAGNOSIS — Z01419 Encounter for gynecological examination (general) (routine) without abnormal findings: Secondary | ICD-10-CM

## 2018-10-07 DIAGNOSIS — N9489 Other specified conditions associated with female genital organs and menstrual cycle: Secondary | ICD-10-CM

## 2018-10-07 DIAGNOSIS — Z124 Encounter for screening for malignant neoplasm of cervix: Secondary | ICD-10-CM

## 2018-10-07 NOTE — Addendum Note (Signed)
Addended by: Megan Salon on: 10/07/2018 02:24 PM   Modules accepted: Orders

## 2018-10-07 NOTE — Progress Notes (Addendum)
38 y.o. [id number] Married White or Caucasian female here for annual exam.  Had C section for delivery of son in November at 37 weeks due to prior myomectomy.  Not nursing.  Cycles are heavy since delivery.  Flow lasts 5-6 days.  First two days have been very heavy.    Patient's last menstrual period was 09/22/2018.          Sexually active: Yes.    The current method of family planning is none.    Exercising: No.   Smoker:  no  Health Maintenance: Pap:  06/04/17 Neg. HR HPV:+detected. #18/45 Positive. Colpo: HPV changes only  05/01/16 ASCUS. HR HPV:neg  History of abnormal Pap:  yes MMG:  Never TDaP:  2013 Screening Labs: here today    reports that she has never smoked. She has never used smokeless tobacco. She reports current alcohol use. She reports that she does not use drugs.  Past Medical History:  Diagnosis Date  . Abnormal Pap smear of cervix 2014, 03/2014   ASCUS, pos HR HPV, 18/45; 2015 LGSIL, + HR HPV  . AMA (advanced maternal age) primigravida 21+   . Dermoid cyst of ovary, left 03/2016  . Fibroid   . GERD (gastroesophageal reflux disease)   . Heart murmur   . History of gastric ulcer    remote hx - resolved  . History of kidney stones   . History of palpitations    hypersenitive to codeine and per pt sometimes tachy  . History of recurrent miscarriages   . 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 Infertility, female   . Migraine   . Nocturia   . PONV (postoperative nausea and vomiting)     Past Surgical History:  Procedure Laterality Date  . AUGMENTATION MAMMAPLASTY Bilateral 01/17/10   implants  . CESAREAN SECTION N/A 06/16/2018   Procedure: Primary CESAREAN SECTION;  Surgeon: Brien Few, MD;  Location: [facility];  Service: Obstetrics;  Laterality: N/A;  EDD: 07/07/18 Allergy: Codeine  . CHROMOPERTUBATION N/A 06/19/2016   Procedure: CHROMOPERTUBATION;  Surgeon: Governor Specking, MD;  Location: [facility];  Service: Gynecology;  Laterality:  N/A;  . COLPOSCOPY  2014, 2015   2015 LGSIL, neg ECC  . DILATION AND EVACUATION  12/30/2007   missed ab  . LAPAROSCOPIC GELPORT ASSISTED MYOMECTOMY N/A 06/19/2016   Procedure: LAPAROSCOPIC GELPORT ASSISTED MYOMECTOMY;  Surgeon: Governor Specking, MD;  Location: [facility];  Service: Gynecology;  Laterality: N/A;  . LAPAROSCOPY     MYOMECTOMY  . OVARIAN CYST REMOVAL Left 06/19/2016   Procedure: LEFT OVARIAN CYSTECTOMY;  Surgeon: Governor Specking, MD;  Location: [facility];  Service: Gynecology;  Laterality: Left;  . TRANSTHORACIC ECHOCARDIOGRAM  01/08/2014   ef 56%/  mild to moderater TR/  trivial PR    Current Outpatient Medications  Medication Sig Dispense Refill  . ibuprofen (ADVIL,MOTRIN) 600 MG tablet Take 1 tablet (600 mg total) by mouth every 6 (six) hours. (Patient taking differently: Take 600 mg by mouth every 6 (six) hours as needed for headache, mild pain or moderate pain. ) 30 tablet 0  . Prenatal Vit-Fe Fumarate-FA (MULTIVITAMIN-PRENATAL) 27-0.8 MG TABS tablet Take 1 tablet by mouth daily.      No current facility-administered medications for this visit.     Family History  Problem Relation Age of Onset  . Thyroid disease Mother   . Hypertension Mother   . Hypertension Maternal Grandmother   . Thyroid disease Maternal Grandmother   . Kidney  disease Maternal Grandmother   . Heart attack Maternal Grandfather   . Cancer Maternal Grandfather        lymph nodes  . Cancer Paternal Grandmother        stomach  . Heart disease Paternal Grandfather        Psychologist, forensic  . Hypotension Paternal Grandfather   . Diabetes Maternal Aunt     Review of Systems  All other systems reviewed and are negative.   Exam:   BP 120/86 (BP Location: Right Arm, Patient Position: Sitting, Cuff Size: Normal)   Pulse 92   Resp 16   Ht 5' 4.5" (1.638 m)   Wt 132 lb 12.8 oz (60.2 kg)   LMP 09/22/2018   Breastfeeding No   BMI 22.44 kg/m   Height: 5' 4.5"  (163.8 cm)  Ht Readings from Last 3 Encounters:  10/07/18 5' 4.5" (1.638 m)  07/13/18 5\' 4"  (1.626 m)  06/16/18 5' 4.5" (1.638 m)    General appearance: alert, cooperative and appears stated age Head: Normocephalic, without obvious abnormality, atraumatic Neck: no adenopathy, supple, symmetrical, trachea midline and thyroid normal to inspection and palpation Lungs: clear to auscultation bilaterally Breasts: normal appearance, no masses or tenderness, bilateral breast implants present Heart: regular rate and rhythm Abdomen: soft, non-tender; bowel sounds normal; no masses,  no organomegaly Extremities: extremities normal, atraumatic, no cyanosis or edema Skin: Skin color, texture, turgor normal. No rashes or lesions Lymph nodes: Cervical, supraclavicular, and axillary nodes normal. No abnormal inguinal nodes palpated Neurologic: Grossly normal   Pelvic: External genitalia:  no lesions              Urethra:  normal appearing urethra with no masses, tenderness or lesions              Bartholins and Skenes: normal                 Vagina: normal appearing vagina with normal color and discharge, no lesions              Cervix: no lesions              Pap taken: No. Bimanual Exam:  Uterus:  normal size, contour, position, consistency, mobility, non-tender              Adnexa: small left ovary, 4cm right adnexal mass               Rectovaginal: Confirms               Anus:  normal sphincter tone, no lesions  Chaperone was present for exam.  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  Will have pt sign release for pap and HR HPV testing post partum to see if pap needs to be sent May desire trial of POP if cycles continue to be heavy Recommend  proceeding with PUS if possible Return annually or prn  Pt was able to have PUS later in the day. Uterus;  7.8 x 6.1 x 4.9cm Endometrium 6.51mm Left ovary:  2.5 x 1.5 x 1.5cm Right ovary: 3.6 x 2.3 x 2.0cm with 1.4cm corpus luteum with adjacent free fluid Cul de sac:  No free fluid except around ovary  Findings reviewed with pt.  Likely I  ruptured the corpus luteum on exam today as there is free fluid around the ovary and the cyst looks collapsed.  No additional follow up needed at this time unless has new pain issues.

## 2018-10-07 NOTE — Progress Notes (Signed)
Ultrasound documentation on AEX note from earlier in same day.

## 2018-10-10 ENCOUNTER — Ambulatory Visit: Payer: Self-pay | Admitting: Obstetrics & Gynecology

## 2018-10-10 ENCOUNTER — Encounter: Payer: Self-pay | Admitting: Obstetrics & Gynecology

## 2018-10-30 ENCOUNTER — Other Ambulatory Visit (HOSPITAL_COMMUNITY)
Admission: RE | Admit: 2018-10-30 | Discharge: 2018-10-30 | Disposition: A | Discharge status: Home / Self Care | Payer: 59 | Source: Ambulatory Visit | Attending: Obstetrics and Gynecology | Admitting: Obstetrics and Gynecology

## 2018-10-30 DIAGNOSIS — Z124 Encounter for screening for malignant neoplasm of cervix: Secondary | ICD-10-CM | POA: Insufficient documentation

## 2018-10-30 NOTE — Addendum Note (Signed)
Addended by: Dorothy Spark on: 10/30/2018 12:51 PM   Modules accepted: Orders

## 2018-11-03 IMAGING — CT CT ABD-PELV W/ CM
2 of 4 series · 16 of 46 positions shown, 18 images · IV contrast (ISOVUE)
Comparison: Renal ultrasound 07/14/2016

CLINICAL DATA: Left abdominal pain radiating to the left flank.
Emesis.

EXAM:
CT ABDOMEN AND PELVIS WITH CONTRAST
TECHNIQUE: Multidetector CT imaging of the abdomen and pelvis was performed
using the standard protocol following bolus administration of
intravenous contrast.
CONTRAST:  100mL [id number] IOPAMIDOL ([id number]) INJECTION 61%

[Series 2: abd/pel with · axial · 0.76mm/px · z∈[+904,+1294]mm · 13 of 88 slices shown, 15 images]
[im 5/88  soft-tissue]
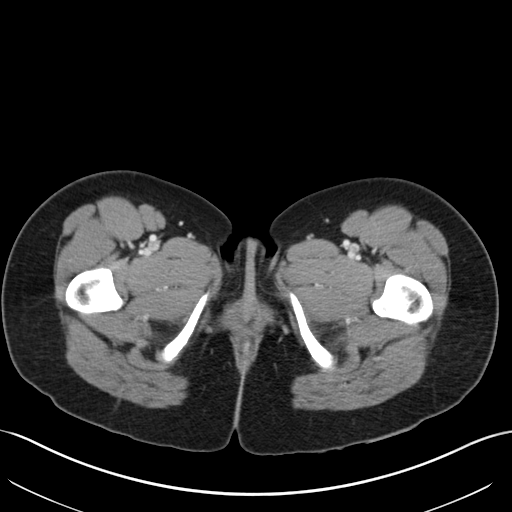
[im 5/88  bone]
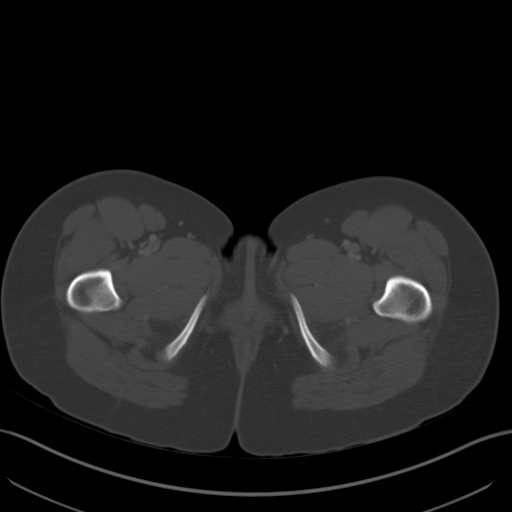
[im 10/88  soft-tissue]
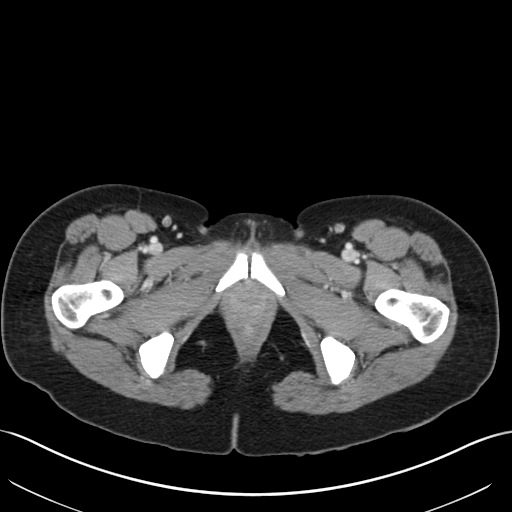
[im 20/88  soft-tissue]
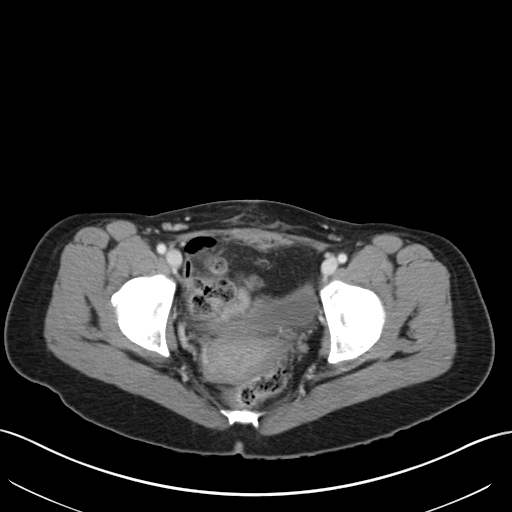
[im 25/88  soft-tissue]
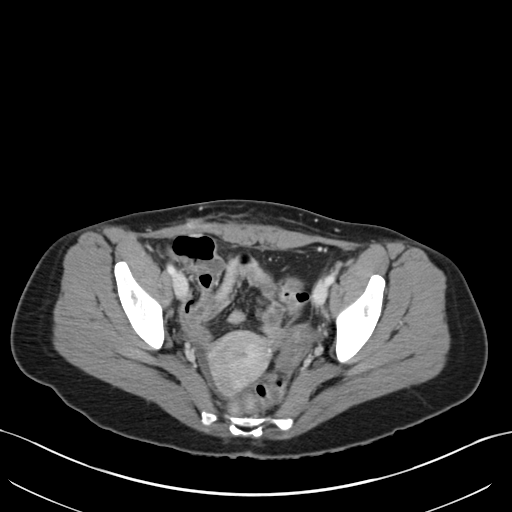
[im 30/88  soft-tissue]
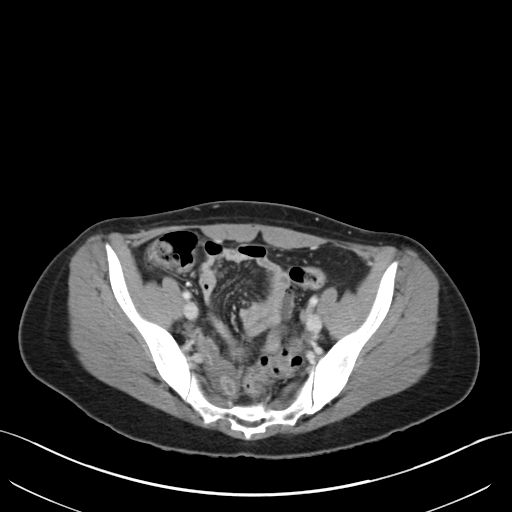
[im 39/88  soft-tissue]
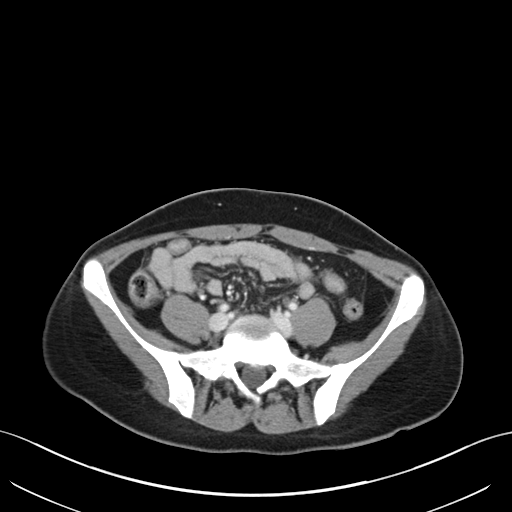
[im 44/88  soft-tissue]
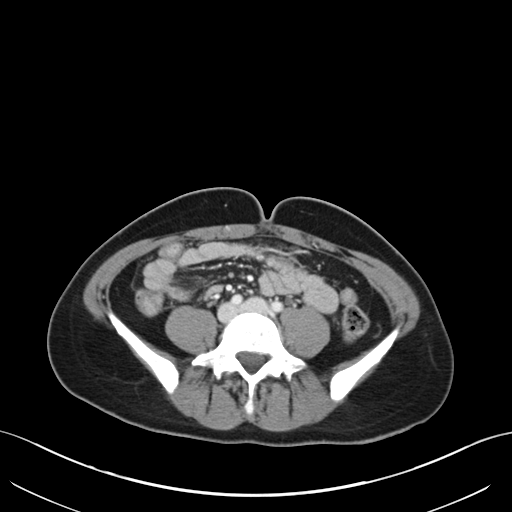
[im 49/88  soft-tissue]
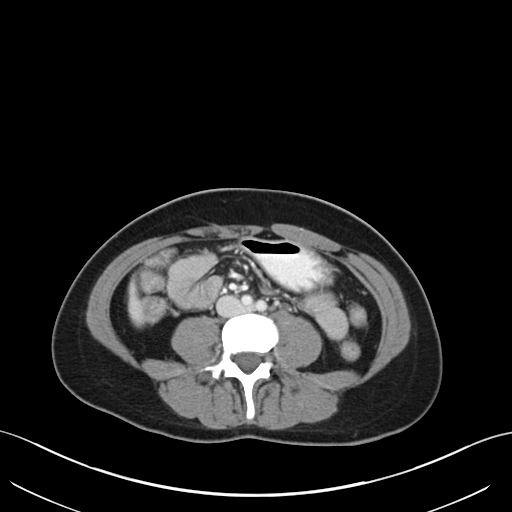
[im 59/88  soft-tissue]
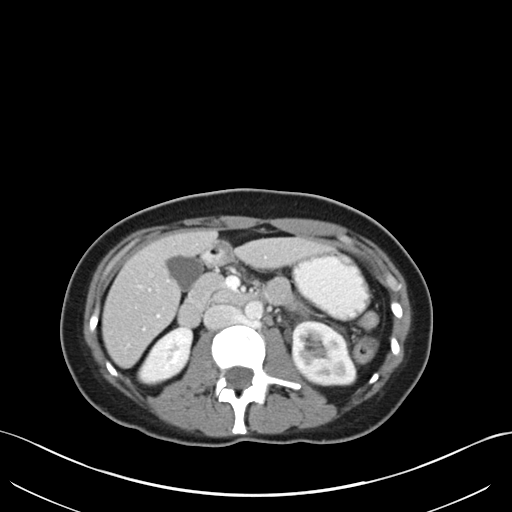
[im 59/88  bone]
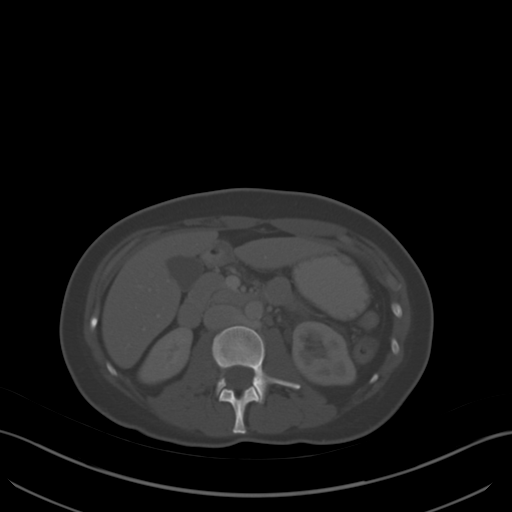
[im 63/88  soft-tissue]
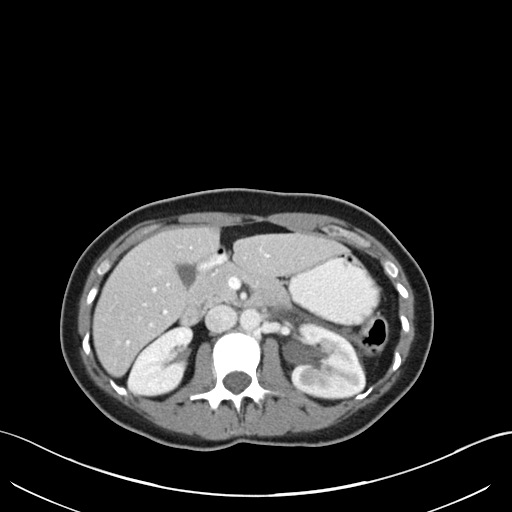
[im 68/88  soft-tissue]
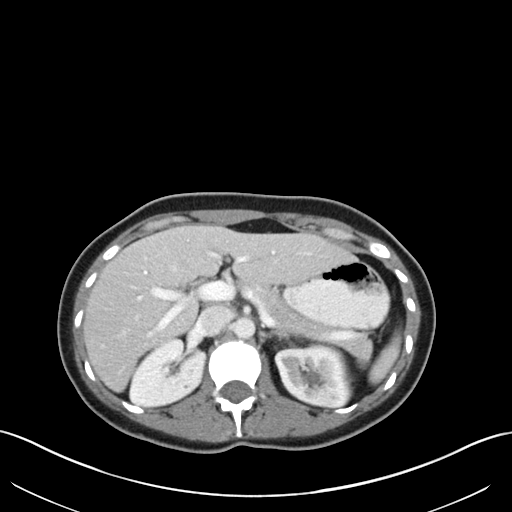
[im 78/88  soft-tissue]
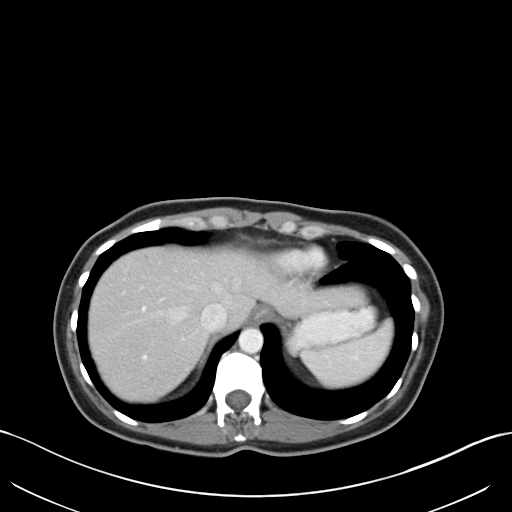
[im 83/88  soft-tissue]
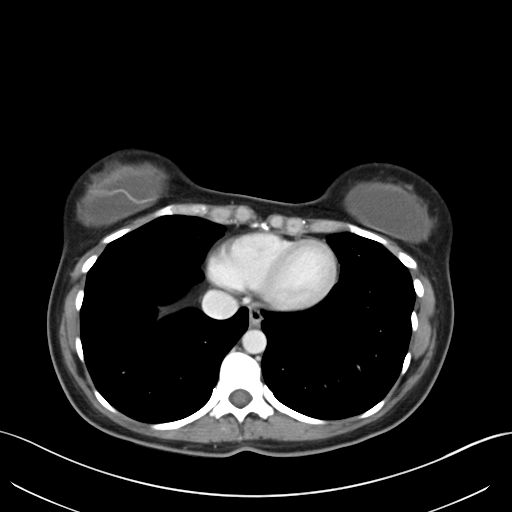

[Series 3: coronal a/|p · coronal · 0.84mm/px · 3 of 137 slices shown]
[im 46/137  soft-tissue]
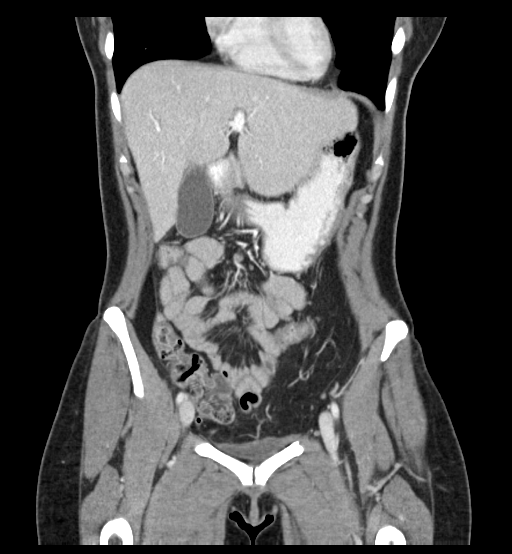
[im 61/137  soft-tissue]
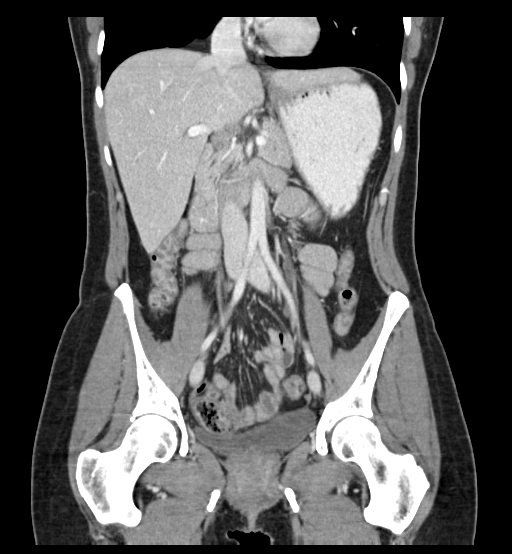
[im 76/137  soft-tissue]
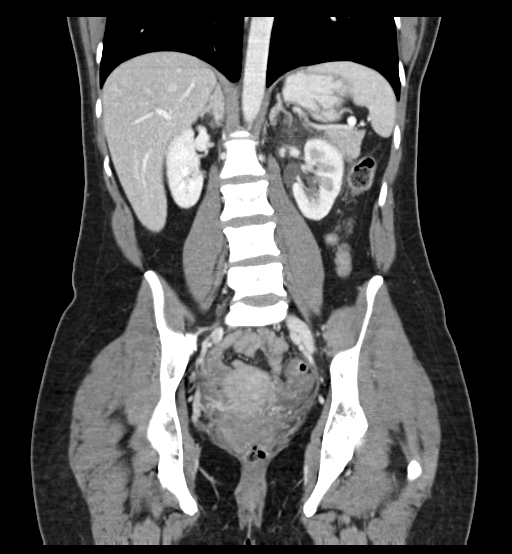

[16 of 46 positions shown; findings below may reference images not displayed]

FINDINGS: Lower chest: Lung bases are clear. No pleural effusions. There are
bilateral breast implants.

Hepatobiliary: Normal appearance of the liver, gallbladder and
portal venous system.

Pancreas: Normal appearance of the pancreas without inflammation or
duct dilatation.

Spleen: Normal appearance of spleen without enlargement.

Adrenals/Urinary Tract: Normal adrenal glands. Normal appearance of
the right kidney without hydronephrosis. Mild left
hydroureteronephrosis with fluid along the anterior aspect of the
left kidney and near the left renal hilum. 2 mm stone in the left
kidney lower pole. 4 mm stone in the distal left ureter nodes near
the left ureterovesical junction. The urinary bladder is
decompressed and unremarkable.

Stomach/Bowel: Stomach is within normal limits. Appendix appears
normal. No evidence of bowel wall thickening, distention, or
inflammatory changes.

Vascular/Lymphatic: Small amount of calcified plaque in the
abdominal aorta without aneurysm. There is a circumaortic left renal
vein. No significant lymph node enlargement in the abdomen or
pelvis.

Reproductive: Uterus appears to be mildly retroverted and there may
be a small fibroid along the posterior aspect of the uterine fundus.
No gross abnormality to the adnexal tissue.

Other: Small amount of fluid in the pelvis is probably physiologic.
Subcutaneous stranding in the anterior pelvis suggestive for prior
surgery.

Musculoskeletal: No acute abnormality.
IMPRESSION: Left perinephric edema with mild left hydroureteronephrosis
secondary to a 4 mm stone near the left ureterovesical junction.
There is an additional small stone in the left kidney lower pole.

Question a small uterine fibroid.

## 2018-11-04 ENCOUNTER — Telehealth: Payer: Self-pay | Admitting: Obstetrics & Gynecology

## 2018-11-04 ENCOUNTER — Encounter: Payer: Self-pay | Admitting: Obstetrics & Gynecology

## 2018-11-04 LAB — CYTOLOGY - PAP
Adequacy: ABSENT
Diagnosis: NEGATIVE
HPV: NOT DETECTED

## 2018-11-04 NOTE — Telephone Encounter (Signed)
Pap:  06/04/17 Neg. HR HPV:+detected. #18/45 Positive. Colpo: HPV changes only 05/01/16 ASCUS. HR HPV:neg   Pap from 10/30/2018 has returned  Adequacy Satisfactory for evaluation endocervical/transformation zone component ABSENT.   Diagnosis NEGATIVE FOR INTRAEPITHELIAL LESIONS OR MALIGNANCY.   HPV NOT DETECTED   Comment: Normal Reference Range - NOT Detected   Do not see results from pap scanned into patient's chart from pap with Dr. Ronita Hipps. Routing to Dr.Jertson to review.

## 2018-11-04 NOTE — Telephone Encounter (Signed)
02 recall entered. Encounter closed.  Notes recorded by Salvadore Dom, MD on 11/04/2018 at 3:47 PM EDT Per [facility]CCP guidelines routine f/u is recommended with absent TZ and negative pap with negative hpv.  02 recall  Hi Fidelis, Your pap and hpv test were negative (normal). Please call the office with any questions. Sumner Boast, MD (covering for Dr Sabra Heck)

## 2018-11-04 NOTE — Telephone Encounter (Signed)
Please see pap result note.

## 2018-11-04 NOTE — Telephone Encounter (Signed)
More Detail >>  Visit Follow-Up Question  [name]  Sent: Tue November 04, 2018 6:34 AM  To: [facility]      Message   Hello. I was just following up on my most recent pap smear and hpv results? I know there was question whether not my OB dr had tested me recently so we were holding my sample until records were sent.   Please call or email back   Thanks   Casey Clarke

## 2018-11-11 ENCOUNTER — Encounter: Payer: Self-pay | Admitting: Obstetrics and Gynecology

## 2018-12-04 ENCOUNTER — Encounter: Payer: Self-pay | Admitting: Primary Care

## 2018-12-04 ENCOUNTER — Telehealth (INDEPENDENT_AMBULATORY_CARE_PROVIDER_SITE_OTHER): Payer: 59 | Admitting: Primary Care

## 2018-12-04 ENCOUNTER — Other Ambulatory Visit (INDEPENDENT_AMBULATORY_CARE_PROVIDER_SITE_OTHER): Payer: 59

## 2018-12-04 ENCOUNTER — Telehealth: Payer: Self-pay | Admitting: Primary Care

## 2018-12-04 VITALS — Temp 97.9°F | Ht 64.5 in | Wt 134.4 lb

## 2018-12-04 DIAGNOSIS — Z111 Encounter for screening for respiratory tuberculosis: Secondary | ICD-10-CM | POA: Diagnosis not present

## 2018-12-04 DIAGNOSIS — Z0184 Encounter for antibody response examination: Secondary | ICD-10-CM

## 2018-12-04 NOTE — Progress Notes (Signed)
Subjective:    Patient ID: Casey Clarke, female    DOB: February 28, 1981, 38 y.o.   MRN: [id number]  HPI  Virtual Visit via Video Note  I connected with Casey Clarke on 12/04/18 at  8:40 AM EDT by a video enabled telemedicine application and verified that I am speaking with the correct person using two identifiers.  Location: Patient: Home Provider: Office   I discussed the limitations of evaluation and management by telemedicine and the availability of in person appointments. The patient expressed understanding and agreed to proceed.  History of Present Illness:  Casey Clarke is a 38 year old female who presents today needing labs drawn for school. 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 She is needing Hepatitis B titers and TB blood testing.   Her last tetanus was in 2013.   Observations/Objective:  Alert and oriented. Appears well, not sickly. No distress. Speaking in complete sentences.   Assessment and Plan:  Patient needing titer testing and TB testing for entry into graduate school. Labs pending.   Follow Up Instructions:  Call the main line to schedule a lab appointment for your blood draw.  It was a pleasure to see you today! Allie Bossier, NP-C    I discussed the assessment and treatment plan with the patient. The patient was provided an opportunity to ask questions and all were answered. The patient agreed with the plan and demonstrated an understanding of the instructions.   The patient was advised to call back or seek an in-person evaluation if the symptoms worsen or if the condition fails to improve as anticipated.     Pleas Koch, NP    Review of Systems  Constitutional: Negative for fever and unexpected weight change.  Respiratory: Negative for cough and shortness of breath.   Cardiovascular: Negative for chest pain.       Past Medical History:  Diagnosis Date  . Abnormal Pap smear of cervix 2014,  03/2014   ASCUS, pos HR HPV, 18/45; 2015 LGSIL, + HR HPV  . AMA (advanced maternal age) primigravida 37+   . Dermoid cyst of ovary, left 03/2016  . Fibroid   . GERD (gastroesophageal reflux disease)   . Heart murmur   . 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 History of gastric ulcer    remote hx - resolved  . History of kidney stones   . History of palpitations    hypersenitive to codeine and per pt sometimes tachy  . History of recurrent miscarriages   . 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 Infertility, female   . Migraine   . Nocturia   . PONV (postoperative nausea and vomiting)      Social History   Socioeconomic History  . Marital status: Married    Spouse name: Not on file  . Number of children: 0  . Years of education: Not on file  . Highest education level: Not on file  Occupational History  . Not on file  Social Needs  . Financial resource strain: Not on file  . Food insecurity:    Worry: Never true    Inability: Never true  . Transportation needs:    Medical: Yes    Non-medical: Not on file  Tobacco Use  . Smoking status: Never Smoker  . Smokeless tobacco: Never Used  Substance and Sexual Activity  . Alcohol use: Yes    Alcohol/week: 0.0 standard drinks    Comment: social  .  Drug use: No  . Sexual activity: Yes    Partners: Male    Birth control/protection: None  Lifestyle  . Physical activity:    Days per week: Not on file    Minutes per session: Not on file  . Stress: Only a little  Relationships  . Social connections:    Talks on phone: Not on file    Gets together: Not on file    Attends religious service: Not on file    Active member of club or organization: Not on file    Attends meetings of clubs or organizations: Not on file    Relationship status: Not on file  . Intimate partner violence:    Fear of current or ex partner: No    Emotionally abused: No    Physically abused: No    Forced sexual activity: No  Other Topics Concern  . Not on  file  Social History Narrative  . Not on file    Past Surgical History:  Procedure Laterality Date  . AUGMENTATION MAMMAPLASTY Bilateral 01/17/10   implants  . CESAREAN SECTION N/A 06/16/2018   Procedure: Primary CESAREAN SECTION;  Surgeon: Brien Few, MD;  Location: [place];  Service: Obstetrics;  Laterality: N/A;  EDD: 07/07/18 Allergy: Codeine  . CHROMOPERTUBATION N/A 06/19/2016   Procedure: CHROMOPERTUBATION;  Surgeon: Governor Specking, MD;  Location: [facility];  Service: Gynecology;  Laterality: N/A;  . COLPOSCOPY  2014, 2015   2015 LGSIL, neg ECC  . DILATION AND EVACUATION  12/30/2007   missed ab  . LAPAROSCOPIC GELPORT ASSISTED MYOMECTOMY N/A 06/19/2016   Procedure: LAPAROSCOPIC GELPORT ASSISTED MYOMECTOMY;  Surgeon: Governor Specking, MD;  Location: [place];  Service: Gynecology;  Laterality: N/A;  . LAPAROSCOPY     MYOMECTOMY  . OVARIAN CYST REMOVAL Left 06/19/2016   Procedure: LEFT OVARIAN CYSTECTOMY;  Surgeon: Governor Specking, MD;  Location: [place];  Service: Gynecology;  Laterality: Left;  . TRANSTHORACIC ECHOCARDIOGRAM  01/08/2014   ef 56%/  mild to moderater TR/  trivial PR    Family History  Problem Relation Age of Onset  . Thyroid disease Mother   . Hypertension Mother   . Hypertension Maternal Grandmother   . Thyroid disease Maternal Grandmother   . Kidney disease Maternal Grandmother   . Heart attack Maternal Grandfather   . Cancer Maternal Grandfather        lymph nodes  . Cancer Paternal Grandmother        stomach  . Heart disease Paternal Grandfather        Psychologist, forensic  . Hypotension Paternal Grandfather   . Diabetes Maternal Aunt     Allergies  Allergen Reactions  . Codeine Other (See Comments)    "palpitations and heart races"    Current Outpatient Medications on File Prior to Visit  Medication Sig Dispense Refill  . ibuprofen (ADVIL,MOTRIN) 600 MG tablet Take 1 tablet  (600 mg total) by mouth every 6 (six) hours. (Patient taking differently: Take 600 mg by mouth every 6 (six) hours as needed for headache, mild pain or moderate pain. ) 30 tablet 0  . Prenatal Vit-Fe Fumarate-FA (MULTIVITAMIN-PRENATAL) 27-0.8 MG TABS tablet Take 1 tablet by mouth daily.      No current facility-administered medications on file prior to visit.     Temp 97.9 F (36.6 C) (Oral)   Ht 5' 4.5" (1.638 m)   Wt 134 lb 6 oz (61 kg)   LMP 11/21/2018  Breastfeeding No   BMI 22.71 kg/m    Objective:   Physical Exam  Constitutional: She is oriented to person, place, and time. She appears well-nourished.  Respiratory: Effort normal.  Neurological: She is alert and oriented to person, place, and time.  Psychiatric: She has a normal mood and affect.           Assessment & Plan:

## 2018-12-04 NOTE — Telephone Encounter (Signed)
Will add on MMR titers. Will send patient my chart message.

## 2018-12-04 NOTE — Assessment & Plan Note (Signed)
Hepatitis B immunity pending.

## 2018-12-04 NOTE — Telephone Encounter (Signed)
Best number [phone]  Pt called back wanting to know if she needs MMR titer before getting the vaccine  Pt had titer done in 2018 one showed neutral and she might need 2nd round of MMR

## 2018-12-04 NOTE — Patient Instructions (Signed)
Call the main line to schedule a lab appointment for your blood draw.  It was a pleasure to see you today! Allie Bossier, NP-C

## 2018-12-04 NOTE — Assessment & Plan Note (Signed)
Tb Quantiferon testing pending. Asymptomatic for TB.

## 2018-12-07 LAB — QUANTIFERON-TB GOLD PLUS
Mitogen-NIL: >10 IU/mL
NIL: 0.05 IU/mL
QuantiFERON-TB Gold Plus: NEGATIVE
TB1-NIL: 0 IU/mL
TB2-NIL: 0.01 IU/mL

## 2018-12-07 LAB — MEASLES/MUMPS/RUBELLA IMMUNITY
Mumps IgG: <9 AU/mL — ABNORMAL LOW
Rubella: 3.95 index
Rubeola IgG: >300 AU/mL

## 2018-12-08 NOTE — Telephone Encounter (Signed)
I have left a message and send a message to patient that she can call our office to schedule a nurse visit appointment for the MMR.

## 2018-12-08 NOTE — Telephone Encounter (Signed)
Casey Clarke, see my chart messages. Can we get her set up for MMR vaccination?

## 2018-12-22 NOTE — Telephone Encounter (Signed)
Pt schedule 6/2

## 2018-12-23 ENCOUNTER — Ambulatory Visit (INDEPENDENT_AMBULATORY_CARE_PROVIDER_SITE_OTHER): Payer: 59 | Admitting: *Deleted

## 2018-12-23 DIAGNOSIS — Z23 Encounter for immunization: Secondary | ICD-10-CM

## 2018-12-23 NOTE — Progress Notes (Signed)
Per orders of Allie Bossier, NP, injection of MMR given by Lauralyn Primes. Patient tolerated injection well.

## 2019-04-30 ENCOUNTER — Encounter: Payer: Self-pay | Admitting: Internal Medicine

## 2019-04-30 ENCOUNTER — Other Ambulatory Visit: Payer: 59

## 2019-04-30 ENCOUNTER — Ambulatory Visit (INDEPENDENT_AMBULATORY_CARE_PROVIDER_SITE_OTHER): Payer: 59 | Admitting: Internal Medicine

## 2019-04-30 DIAGNOSIS — R3915 Urgency of urination: Secondary | ICD-10-CM

## 2019-04-30 DIAGNOSIS — R350 Frequency of micturition: Secondary | ICD-10-CM | POA: Diagnosis not present

## 2019-04-30 LAB — POC URINALSYSI DIPSTICK (AUTOMATED)
Bilirubin, UA: NEGATIVE
Blood, UA: NEGATIVE
Glucose, UA: NEGATIVE
Ketones, UA: NEGATIVE
Leukocytes, UA: NEGATIVE
Nitrite, UA: NEGATIVE
Protein, UA: NEGATIVE
Spec Grav, UA: 1.015 (ref 1.010–1.025)
Urobilinogen, UA: 0.2 E.U./dL
pH, UA: 5.5 (ref 5.0–8.0)

## 2019-04-30 NOTE — Patient Instructions (Signed)
Urinary Frequency, Adult Urinary frequency means urinating more often than usual. You may urinate every 1-2 hours even though you drink a normal amount of fluid and do not have a bladder infection or condition. Although you urinate more often than normal, the total amount of urine produced in a day is normal. With urinary frequency, you may have an urgent need to urinate often. The stress and anxiety of needing to find a bathroom quickly can make this urge worse. This condition may go away on its own or you may need treatment at home. Home treatment may include bladder training, exercises, taking medicines, or making changes to your diet. Follow these instructions at home: Bladder health   Keep a bladder diary if told by your health care provider. Keep track of: ? What you eat and drink. ? How often you urinate. ? How much you urinate.  Follow a bladder training program if told by your health care provider. This may include: ? Learning to delay going to the bathroom. ? Double urinating (voiding). This helps if you are not completely emptying your bladder. ? Scheduled voiding.  Do Kegel exercises as told by your health care provider. Kegel exercises strengthen the muscles that help control urination, which may help the condition. Eating and drinking  If told by your health care provider, make diet changes, such as: ? Avoiding caffeine. ? Drinking fewer fluids, especially alcohol. ? Not drinking in the evening. ? Avoiding foods or drinks that may irritate the bladder. These include coffee, tea, soda, artificial sweeteners, citrus, tomato-based foods, and chocolate. ? Eating foods that help prevent or ease constipation. Constipation can make this condition worse. Your health care provider may recommend that you:  Drink enough fluid to keep your urine pale yellow.  Take over-the-counter or prescription medicines.  Eat foods that are high in fiber, such as beans, whole grains, and fresh  fruits and vegetables.  Limit foods that are high in fat and processed sugars, such as fried or sweet foods. General instructions  Take over-the-counter and prescription medicines only as told by your health care provider.  Keep all follow-up visits as told by your health care provider. This is important. Contact a health care provider if:  You start urinating more often.  You feel pain or irritation when you urinate.  You notice blood in your urine.  Your urine looks cloudy.  You develop a fever.  You begin vomiting. Get help right away if:  You are unable to urinate. Summary  Urinary frequency means urinating more often than usual. With urinary frequency, you may urinate every 1-2 hours even though you drink a normal amount of fluid and do not have a bladder infection or other bladder condition.  Your health care provider may recommend that you keep a bladder diary, follow a bladder training program, or make dietary changes.  If told by your health care provider, do Kegel exercises to strengthen the muscles that help control urination.  Take over-the-counter and prescription medicines only as told by your health care provider.  Contact a health care provider if your symptoms do not improve or get worse. This information is not intended to replace advice given to you by your health care provider. Make sure you discuss any questions you have with your health care provider. Document Released: 05/05/2009 Document Revised: 01/16/2018 Document Reviewed: 01/16/2018 Elsevier Patient Education  2020 Elsevier Inc.  

## 2019-04-30 NOTE — Progress Notes (Signed)
Virtual Visit via Video Note  I connected with Casey Clarke on 04/30/19 at  2:00 PM EDT by a video enabled telemedicine application and verified that I am speaking with the correct person using two identifiers.  Location: Patient: Home Provider: Office   I discussed the limitations of evaluation and management by telemedicine and the availability of in person appointments. The patient expressed understanding and agreed to proceed.  History of Present Illness: HPI  Pt presents to the clinic today with c/o urinary urgency and frequency. She reports this started last night. She denies dysuria, blood in her urine. She denies vaginal discharge, odor, irritation or abnormal. She denies fever, chills, nausea or low back pain. She has had a kidney stone in the past and reports this feels similar. She has not taken anything OTC for this.   Review of Systems  Past Medical History:  Diagnosis Date  . Abnormal Pap smear of cervix 2014, 03/2014   ASCUS, pos HR HPV, 18/45; 2015 LGSIL, + HR HPV  . AMA (advanced maternal age) primigravida 45+   . Dermoid cyst of ovary, left 03/2016  . Fibroid   . GERD (gastroesophageal reflux disease)   . H/O myomectomy 06/16/2018  . Heart murmur   . 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 History of gastric ulcer    remote hx - resolved  . History of kidney stones   . History of palpitations    hypersenitive to codeine and per pt sometimes tachy  . History of recurrent miscarriages   . 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 Infertility, female   . Migraine   . Nocturia   . PONV (postoperative nausea and vomiting)   . Postpartum care following cesarean delivery (11/25) 06/16/2018  . Pregnancy with history of uterine myomectomy 06/16/2018  . S/P cesarean section 06/16/2018    Family History  Problem Relation Age of Onset  . Thyroid disease Mother   . Hypertension Mother   . Hypertension Maternal Grandmother   . Thyroid disease Maternal Grandmother   .  Kidney disease Maternal Grandmother   . Heart attack Maternal Grandfather   . Cancer Maternal Grandfather        lymph nodes  . Cancer Paternal Grandmother        stomach  . Heart disease Paternal Grandfather        Psychologist, forensic  . Hypotension Paternal Grandfather   . Diabetes Maternal Aunt     Social History   Socioeconomic History  . Marital status: Married    Spouse name: Not on file  . Number of children: 0  . Years of education: Not on file  . Highest education level: Not on file  Occupational History  . Not on file  Social Needs  . Financial resource strain: Not on file  . Food insecurity    Worry: Never true    Inability: Never true  . Transportation needs    Medical: Yes    Non-medical: Not on file  Tobacco Use  . Smoking status: Never Smoker  . Smokeless tobacco: Never Used  Substance and Sexual Activity  . Alcohol use: Yes    Alcohol/week: 0.0 standard drinks    Comment: social  . Drug use: No  . Sexual activity: Yes    Partners: Male    Birth control/protection: None  Lifestyle  . Physical activity    Days per week: Not on file    Minutes per session: Not on file  . Stress: Only a  little  Relationships  . Social Herbalist on phone: Not on file    Gets together: Not on file    Attends religious service: Not on file    Active member of club or organization: Not on file    Attends meetings of clubs or organizations: Not on file    Relationship status: Not on file  . Intimate partner violence    Fear of current or ex partner: No    Emotionally abused: No    Physically abused: No    Forced sexual activity: No  Other Topics Concern  . Not on file  Social History Narrative  . Not on file    Allergies  Allergen Reactions  . Codeine Other (See Comments)    "palpitations and heart races"     Constitutional: Denies fever, malaise, fatigue, headache or abrupt weight changes.   GU: Pt reports urgency, frequency. Denies dysuria, burning  sensation, blood in urine, odor or discharge. Skin: Denies redness, rashes, lesions or ulcercations.   No other specific complaints in a complete review of systems (except as listed in HPI above).    Objective:   Physical Exam   Wt Readings from Last 3 Encounters:  12/04/18 134 lb 6 oz (61 kg)  10/07/18 132 lb 12.8 oz (60.2 kg)  07/13/18 138 lb 9.6 oz (62.9 kg)    General: Appears her stated age, well developed, well nourished in NAD.        Assessment & Plan:   Urgency, Frequency:  Urinalysis: normal Will send urine culture Consider CT renal stone if symptoms persist or worsen Drink plenty of fluids  RTC as needed or if symptoms persist.  Follow Up Instructions:    I discussed the assessment and treatment plan with the patient. The patient was provided an opportunity to ask questions and all were answered. The patient agreed with the plan and demonstrated an understanding of the instructions.   The patient was advised to call back or seek an in-person evaluation if the symptoms worsen or if the condition fails to improve as anticipated.   Webb Silversmith, NP

## 2019-05-01 LAB — URINE CULTURE
MICRO NUMBER:: 969369
Result:: NO GROWTH
SPECIMEN QUALITY:: ADEQUATE

## 2019-05-04 ENCOUNTER — Encounter: Payer: Self-pay | Admitting: Internal Medicine

## 2019-05-04 DIAGNOSIS — Z87442 Personal history of urinary calculi: Secondary | ICD-10-CM

## 2019-05-04 DIAGNOSIS — R3915 Urgency of urination: Secondary | ICD-10-CM

## 2019-05-04 DIAGNOSIS — R350 Frequency of micturition: Secondary | ICD-10-CM

## 2019-05-05 ENCOUNTER — Ambulatory Visit
Admission: RE | Admit: 2019-05-05 | Discharge: 2019-05-05 | Disposition: A | Discharge status: Home / Self Care | Payer: 59 | Source: Ambulatory Visit | Attending: Internal Medicine | Admitting: Internal Medicine

## 2019-05-05 ENCOUNTER — Telehealth: Payer: Self-pay

## 2019-05-05 ENCOUNTER — Other Ambulatory Visit: Payer: Self-pay

## 2019-05-05 DIAGNOSIS — R350 Frequency of micturition: Secondary | ICD-10-CM | POA: Diagnosis not present

## 2019-05-05 DIAGNOSIS — Z87442 Personal history of urinary calculi: Secondary | ICD-10-CM | POA: Insufficient documentation

## 2019-05-05 DIAGNOSIS — R3915 Urgency of urination: Secondary | ICD-10-CM | POA: Diagnosis present

## 2019-05-05 DIAGNOSIS — N200 Calculus of kidney: Secondary | ICD-10-CM

## 2019-05-05 MED ORDER — TAMSULOSIN HCL 0.4 MG PO CAPS: 0.4000 mg | ORAL_CAPSULE | Freq: Every day | ORAL | 0 refills | Status: DC

## 2019-05-05 NOTE — Telephone Encounter (Signed)
Casey Clarke with [facility] Radiology called report  CT Renal Study; report is in [place]. Avie Echevaria NP is out of office and will send to [facility] CMA also.

## 2019-05-05 NOTE — Telephone Encounter (Addendum)
Noted.  Will respond to patient via [place]. Threasa Beards, will you forward me this patient's CT scan result?  Also spoke with patient via phone regarding results. She does have some left sided flank pain intermittently, feels like her prior kidney stone from 3 years ago. Discussed options for treatment, she would like to move forward with Flomax as this has helped in the past. Return precautions provided.

## 2019-05-06 ENCOUNTER — Telehealth: Payer: Self-pay | Admitting: *Deleted

## 2019-05-06 NOTE — Telephone Encounter (Signed)
Patient left a voicemail requesting a call back regarding her CT scan. Patient stated that she wants to know the exact location of the kidney stone that she has.

## 2019-05-06 NOTE — Telephone Encounter (Signed)
Casey Clarke, can you release the CT scan result to her My Chart account so she can view?

## 2019-05-06 NOTE — Telephone Encounter (Signed)
It should already be released, I reviewed this yesterday.

## 2019-07-24 ENCOUNTER — Ambulatory Visit (INDEPENDENT_AMBULATORY_CARE_PROVIDER_SITE_OTHER)
Admission: RE | Admit: 2019-07-24 | Discharge: 2019-07-24 | Disposition: A | Discharge status: Home / Self Care | Payer: 59 | Source: Ambulatory Visit

## 2019-07-24 DIAGNOSIS — R5383 Other fatigue: Secondary | ICD-10-CM | POA: Diagnosis not present

## 2019-07-24 DIAGNOSIS — U071 COVID-19: Secondary | ICD-10-CM | POA: Diagnosis not present

## 2019-07-24 DIAGNOSIS — R110 Nausea: Secondary | ICD-10-CM

## 2019-07-24 MED ORDER — ONDANSETRON HCL 4 MG PO TABS: 4.0000 mg | ORAL_TABLET | Freq: Four times a day (QID) | ORAL | 0 refills | Status: DC | PRN

## 2019-07-24 NOTE — Discharge Instructions (Addendum)
Take the Zofran as needed for nausea.  Follow up with your primary care provider if your symptoms are not improving.    Go to the emergency department if you develop high fever, shortness of breath, severe diarrhea, or other concerning symptoms.

## 2019-07-24 NOTE — ED Provider Notes (Signed)
Virtual Visit via Video Note:  [name]  initiated request for Telemedicine visit with [facility] team. I connected with Casey Clarke  on 07/24/2019 at 5:56 PM  for a synchronized telemedicine visit using a video enabled HIPPA compliant telemedicine application. I verified that I am speaking with Casey Clarke  using two identifiers. [name], NP  was physically located in a [facility] Urgent care site and KEYASHA SLIMMER was located at a different location.   The limitations of evaluation and management by telemedicine as well as the availability of in-person appointments were discussed. Patient was informed that she  may incur a bill ( including co-pay) for this virtual visit encounter. Casey Clarke  expressed understanding and gave verbal consent to proceed with virtual visit.     History of Present Illness:Casey Clarke  is a 39 y.o. female presents for evaluation of fatigue, decreased appetite, nausea, chills x 2 days.  She was diagnosed COVID positive on 07/16/2019.  She has lost her sense of taste/smell and has 1-2 episodes of diarrhea daily.  Tmax 100.7 6 days ago.  She has been treating her symptoms at home with left over Zofran from 2019. She reports she is drinking liquids without difficulty and is able to keep herself hydrated.  She denies fever, shortness of breath, vomiting, or other symptoms.  Patient denies current pregnancy or breastfeeding.     Allergies  Allergen Reactions  . Codeine Other (See Comments)    "palpitations and heart races"     Past Medical History:  Diagnosis Date  . Abnormal Pap smear of cervix 2014, 03/2014   ASCUS, pos HR HPV, 18/45; 2015 LGSIL, + HR HPV  . AMA (advanced maternal age) primigravida 48+   . Dermoid cyst of ovary, left 03/2016  . Fibroid   . GERD (gastroesophageal reflux disease)   . H/O myomectomy 06/16/2018  . Heart murmur   . 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 History of gastric  ulcer    remote hx - resolved  . History of kidney stones   . History of palpitations    hypersenitive to codeine and per pt sometimes tachy  . History of recurrent miscarriages   . 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 Infertility, female   . Migraine   . Nocturia   . PONV (postoperative nausea and vomiting)   . Postpartum care following cesarean delivery (11/25) 06/16/2018  . Pregnancy with history of uterine myomectomy 06/16/2018  . S/P cesarean section 06/16/2018     Social History   Tobacco Use  . Smoking status: Never Smoker  . Smokeless tobacco: Never Used  Substance Use Topics  . Alcohol use: Yes    Alcohol/week: 0.0 standard drinks    Comment: social  . Drug use: No        Observations/Objective: Physical Exam  VITALS: Patient denies fever. GENERAL: Alert, appears well and in no acute distress. HEENT: Atraumatic. NECK: Normal movements of the head and neck. CARDIOPULMONARY: No increased WOB. Speaking in clear sentences. I:E ratio WNL.  MS: Moves all visible extremities without noticeable abnormality. PSYCH: Pleasant and cooperative, well-groomed. Speech normal rate and rhythm. Affect is appropriate. Insight and judgement are appropriate. Attention is focused, linear, and appropriate.  NEURO: CN grossly intact. Oriented as arrived to appointment on time with no prompting. Moves both UE equally.  SKIN: No obvious lesions, wounds, erythema, or cyanosis noted on face or hands.   Assessment and Plan:  ICD-10-CM   1. COVID-19  U07.1   2. Fatigue, unspecified type  R53.83   3. Nausea without vomiting  R11.0        Follow Up Instructions: Treating nausea with Zofran.  Directed patient to follow-up with her PCP if her symptoms are not improving.  Instructed her to go to the emergency department if she has high fever not relieved by Tylenol, shortness of breath, severe diarrhea, or other concerning symptoms.  Patient agrees to plan of care.    I discussed the assessment  and treatment plan with the patient. The patient was provided an opportunity to ask questions and all were answered. The patient agreed with the plan and demonstrated an understanding of the instructions.   The patient was advised to call back or seek an in-person evaluation if the symptoms worsen or if the condition fails to improve as anticipated.      [name], NP  07/24/2019 5:56 PM         [name], NP 07/24/19 [id number]

## 2019-07-26 DIAGNOSIS — R110 Nausea: Secondary | ICD-10-CM

## 2019-07-27 ENCOUNTER — Telehealth: Payer: Self-pay

## 2019-07-27 MED ORDER — PROMETHAZINE HCL 12.5 MG PO TABS: 12.5000 mg | ORAL_TABLET | Freq: Two times a day (BID) | ORAL | 0 refills | Status: DC | PRN

## 2019-07-27 NOTE — Telephone Encounter (Signed)
Spoke with patient regarding concerns, she's very nauseated and has diarrhea and hasn't eaten in 4-5 five days. She is hydrating with fluids.  No improvement with Zofran. She's doing better with promethazine for which she has an old prescription at home, will send updated prescription. She will also have [employer] through [employer] send FMLA paperwork for leave of absence.   She will update later this week.

## 2019-07-27 NOTE — Telephone Encounter (Addendum)
My chart message has yet to be viewed, will view later today when able. Visit from UC reviewed.

## 2019-07-27 NOTE — Telephone Encounter (Signed)
Casey Clarke - Client TELEPHONE ADVICE RECORD [facility] Patient Name: Casey Clarke Gender: Female DOB: May 22, 1981 Age: 39 Y 61 M 26 D Return Phone Number: Casey Clarke:[phone] (Primary) Address: City/State/Zip: [place] [address] Client Casey Clarke - Client Client Site [facility] [place] [facility] Physician Alma Friendly - NP Contact Type Call Who Is Calling Patient / Member / Family / Caregiver Call Type Triage / Clinical Relationship To Patient Self Return Phone Number [phone] (Primary) Chief Complaint Weakness, Generalized Reason for Call Symptomatic / Request for [place] states that she has extreme fatigue, nausea, diarrhea and weakness. States that she can drink but due to not being able to taste or smell she is not eating much. No blood in stool and has had urine output. No fever. Testes positive for COVID 12.24.2020 Translation No Nurse Assessment Nurse: Malachi Carl, RN, Jana Half Date/Time Eilene Ghazi Time): 07/24/2019 12:18:58 PM Confirm and document reason for call. If symptomatic, describe symptoms. ---The caller states that she was confirmed for Covid on 07/16/2019. She feels exhausted and doesn't even want to get out of bed. She states now that she feels she is so tense and worried. Has the patient had close contact with a person known or suspected to have the novel coronavirus illness OR traveled / lives in area with major community spread (including international travel) in the last 14 days from the onset of symptoms? * If Asymptomatic, screen for exposure and travel within the last 14 days. ---Yes Does the patient have any new or worsening symptoms? ---Yes Will a triage be completed? ---Yes Related visit to physician within the last 2 weeks? ---N/A Does the PT have any chronic conditions? (i.e. diabetes, asthma, this includes High risk factors for pregnancy,  etc.) ---No Is the patient pregnant or possibly pregnant? (Ask all females between the ages of 30-55) ---No Is this a behavioral health or substance abuse call? 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 CONFIDENTIALTY NOTICE: This fax transmission is intended only for the addressee. It contains information that is legally privileged, confidential or otherwise protected from use or disclosure. If you are not the intended recipient, you are strictly prohibited from reviewing, disclosing, copying using or disseminating any of this information or taking any action in reliance on or regarding this information. If you have received this fax in error, please notify us immediately by telephone so that we can arrange for its return to [place]. Phone: [phone], Toll-Free: [phone], Fax: [phone] Page: 2 of 2 Call Id: [id number] Guidelines Guideline Title Affirmed Question Affirmed Notes Nurse Date/Time Eilene Ghazi Time) school, normal activities) AND [2] persists > 1 week Disp. Time Eilene Ghazi Time) Disposition Final User 07/24/2019 11:56:47 AM Attempt made - message left Scarlette Shorts 07/24/2019 12:33:00 PM SEE PCP WITHIN 3 DAYS Yes Malachi Carl, RN, Leward Quan Disagree/Comply Comply Caller Understands Yes PreDisposition Call Doctor Care Advice Given Per Guideline SEE PCP WITHIN 3 DAYS: * IF PATIENT HAS NO PCP (PRIMARY CARE PROVIDER): A clinic or urgent care center are good places to go for care if you do not have a primary care provider. NOTE: Try to help caller find a PCP for future care (e.g., use a physician referral line). Having a PCP  or 'medical home' means better long-term care. BRING  MEDICINES: * It is also a good idea to bring the pill bottles too. This will help the doctor to make certain you are taking the right medicines and the right dose. CALL BACK IF: * You become worse. CARE ADVICE given per Weakness and Fatigue (Adult) guideline. After Care Instructions Given Call Event Type User Date / Time Description Education document email Scarlette Shorts 07/24/2019 12:30:53 PM Coronavirus (COVID-19) Prevention Education document email Malachi Carl, RN, Jana Half 07/24/2019 12:30:53 PM Coronavirus or Influenza - How to Tell Education document email Scarlette Shorts 07/24/2019 12:30:53 PM COVID-19 Bridget Hartshorn Education document email Malachi Carl, RN, Jana Half 07/24/2019 12:30:53 PM What To Do If You Are Sick With COVID-19_English Education document email Scarlette Shorts 07/24/2019 12:30:54 PM [facility] Connect Now Instructions Comments User: Casey Antu, RN Date/Time Eilene Ghazi Time): 07/24/2019 12:21:45 PM She has a 13 month child who also has Covid and her husband has it also. Referrals REFERRED TO PCP OFFICE

## 2019-07-27 NOTE — Telephone Encounter (Signed)
Pt had virtual visit with [facility] on 07/23/18; see pt message on 07/25/18. FYI to Gentry Fitz NP and Vallarie Mare CMA.

## 2019-08-03 NOTE — Telephone Encounter (Signed)
Paperwork in [place] in box for review and signature

## 2019-08-03 NOTE — Telephone Encounter (Signed)
Correction on cart to be delivered

## 2019-08-03 NOTE — Telephone Encounter (Signed)
paperworkfaxed 

## 2019-08-03 NOTE — Telephone Encounter (Signed)
Casey Clarke, have you received anything from [facility] in regards to FMLA? She had Covid. Thanks!

## 2019-08-07 NOTE — Telephone Encounter (Signed)
Copy for pt  Copy for scan Sent my chart message letting pt know paperwork has been faxed

## 2019-08-26 ENCOUNTER — Encounter: Payer: Self-pay | Admitting: Primary Care

## 2019-08-26 ENCOUNTER — Ambulatory Visit: Payer: 59 | Admitting: Primary Care

## 2019-08-26 ENCOUNTER — Other Ambulatory Visit: Payer: Self-pay

## 2019-08-26 VITALS — BP 128/86 | HR 99 | Temp 97.5°F | Ht 64.5 in | Wt 130.5 lb

## 2019-08-26 DIAGNOSIS — F411 Generalized anxiety disorder: Secondary | ICD-10-CM | POA: Insufficient documentation

## 2019-08-26 DIAGNOSIS — R002 Palpitations: Secondary | ICD-10-CM

## 2019-08-26 DIAGNOSIS — R350 Frequency of micturition: Secondary | ICD-10-CM | POA: Diagnosis not present

## 2019-08-26 LAB — COMPREHENSIVE METABOLIC PANEL
ALT: 20 U/L (ref 0–35)
AST: 15 U/L (ref 0–37)
Albumin: 4.4 g/dL (ref 3.5–5.2)
Alkaline Phosphatase: 33 U/L — ABNORMAL LOW (ref 39–117)
BUN: 9 mg/dL (ref 6–23)
CO2: 28 mEq/L (ref 19–32)
Calcium: 9.8 mg/dL (ref 8.4–10.5)
Chloride: 102 mEq/L (ref 96–112)
Creatinine, Ser: 0.89 mg/dL (ref 0.40–1.20)
GFR: 71.11 mL/min (ref >60.00)
Glucose, Bld: 95 mg/dL (ref 70–99)
Potassium: 3.6 mEq/L (ref 3.5–5.1)
Sodium: 136 mEq/L (ref 135–145)
Total Bilirubin: 0.5 mg/dL (ref 0.2–1.2)
Total Protein: 7.4 g/dL (ref 6.0–8.3)

## 2019-08-26 LAB — CBC
HCT: 41.2 % (ref 36.0–46.0)
Hemoglobin: 14 g/dL (ref 12.0–15.0)
MCHC: 33.9 g/dL (ref 30.0–36.0)
MCV: 89.2 fl (ref 78.0–100.0)
Platelets: 353 10*3/uL (ref 150.0–400.0)
RBC: 4.61 Mil/uL (ref 3.87–5.11)
RDW: 13.2 % (ref 11.5–15.5)
WBC: 11.3 10*3/uL — ABNORMAL HIGH (ref 4.0–10.5)

## 2019-08-26 LAB — VITAMIN B12: Vitamin B-12: 592 pg/mL (ref 211–911)

## 2019-08-26 LAB — POC URINALSYSI DIPSTICK (AUTOMATED)
Bilirubin, UA: NEGATIVE
Blood, UA: NEGATIVE
Glucose, UA: NEGATIVE
Ketones, UA: NEGATIVE
Leukocytes, UA: NEGATIVE
Nitrite, UA: NEGATIVE
Protein, UA: NEGATIVE
Spec Grav, UA: 1.015 (ref 1.010–1.025)
Urobilinogen, UA: 0.2 E.U./dL
pH, UA: 6 (ref 5.0–8.0)

## 2019-08-26 LAB — TSH: TSH: 1.07 u[IU]/mL (ref 0.35–4.50)

## 2019-08-26 MED ORDER — SERTRALINE HCL 50 MG PO TABS: 50.0000 mg | ORAL_TABLET | Freq: Every day | ORAL | 3 refills | Status: DC

## 2019-08-26 MED ORDER — HYDROXYZINE HCL 10 MG PO TABS: ORAL_TABLET | ORAL | 0 refills | Status: DC

## 2019-08-26 NOTE — Assessment & Plan Note (Signed)
Chronic and intermittent symptoms. Could be anxiety induced.  UA today negative.   Will treat anxiety. Limit caffeine.

## 2019-08-26 NOTE — Progress Notes (Signed)
Subjective:    Patient ID: Casey Clarke, female    DOB: 1981/03/18, 39 y.o.   MRN: [id number]  HPI  This visit occurred during the SARS-CoV-2 public health emergency.  Safety protocols were in place, including screening questions prior to the visit, additional usage of staff PPE, and extensive cleaning of exam room while observing appropriate contact time as indicated for disinfecting solutions.   Casey Clarke is a 39 year old female with a history of palpitations, Covid-19 infection who presents today to discuss anxiety.  Chronic and intermittent anxiety for years. Never treated with medication or therapy in the past. Symptoms include palpitations, chest tightness, fatigue, decreased appetite, diarrhea, tearfulness. Symptoms began initially around Christmas 2020, improved until three days ago. She recently started a new job, also in [employer] school which has caused a lot of stress.   Sunday evening she ate a large meal, belt really bloated, had nausea, diarrhea, fatigue, decrease appetite since. These were the same symptoms she experienced around Christmas.   GAD 7 score of 16 and PHQ-9 score of 16 today.  BP Readings from Last 3 Encounters:  08/26/19 128/86  10/07/18 120/86  07/14/18 121/83     Review of Systems  Cardiovascular: Positive for palpitations.  Gastrointestinal: Positive for nausea.  Psychiatric/Behavioral: The patient is nervous/anxious.        See HPI       Past Medical History:  Diagnosis Date  . Abnormal Pap smear of cervix 2014, 03/2014   ASCUS, pos HR HPV, 18/45; 2015 LGSIL, + HR HPV  . AMA (advanced maternal age) primigravida 28+   . Dermoid cyst of ovary, left 03/2016  . Fibroid   . GERD (gastroesophageal reflux disease)   . H/O myomectomy 06/16/2018  . Heart murmur   . Heterozygous MTHFR mutation C677T ([place])   . History of gastric ulcer    remote hx - resolved  . History of kidney stones   . History of palpitations    hypersenitive to  codeine and per pt sometimes tachy  . History of recurrent miscarriages   . 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 Infertility, female   . Migraine   . Nocturia   . PONV (postoperative nausea and vomiting)   . Postpartum care following cesarean delivery (11/25) 06/16/2018  . Pregnancy with history of uterine myomectomy 06/16/2018  . S/P cesarean section 06/16/2018     Social History   Socioeconomic History  . Marital status: Married    Spouse name: Not on file  . Number of children: 0  . Years of education: Not on file  . Highest education level: Not on file  Occupational History  . Not on file  Tobacco Use  . Smoking status: Never Smoker  . Smokeless tobacco: Never Used  Substance and Sexual Activity  . Alcohol use: Yes    Alcohol/week: 0.0 standard drinks    Comment: social  . Drug use: No  . Sexual activity: Yes    Partners: Male    Birth control/protection: None  Other Topics Concern  . Not on file  Social History Narrative  . Not on file   Social Determinants of Health   Financial Resource Strain:   . Difficulty of Paying Living Expenses: Not on file  Food Insecurity:   . Worried About Charity fundraiser in the Last Year: Not on file  . Ran Out of Food in the Last Year: Not on file  Transportation Needs:   . Lack of Transportation (  Medical): Not on file  . Lack of Transportation (Non-Medical): Not on file  Physical Activity:   . Days of Exercise per Week: Not on file  . Minutes of Exercise per Session: Not on file  Stress:   . Feeling of Stress : Not on file  Social Connections:   . Frequency of Communication with Friends and Family: Not on file  . Frequency of Social Gatherings with Friends and Family: Not on file  . Attends Religious Services: Not on file  . Active Member of Clubs or Organizations: Not on file  . Attends Archivist Meetings: Not on file  . Marital Status: Not on file  Intimate Partner Violence:   . Fear of Current or Ex-Partner: Not  on file  . Emotionally Abused: Not on file  . Physically Abused: Not on file  . Sexually Abused: Not on file    Past Surgical History:  Procedure Laterality Date  . AUGMENTATION MAMMAPLASTY Bilateral 01/17/10   implants  . CESAREAN SECTION N/A 06/16/2018   Procedure: Primary CESAREAN SECTION;  Surgeon: Brien Few, MD;  Location: [facility];  Service: Obstetrics;  Laterality: N/A;  EDD: 07/07/18 Allergy: Codeine  . CHROMOPERTUBATION N/A 06/19/2016   Procedure: CHROMOPERTUBATION;  Surgeon: Governor Specking, MD;  Location: [facility];  Service: Gynecology;  Laterality: N/A;  . COLPOSCOPY  2014, 2015   2015 LGSIL, neg ECC  . DILATION AND EVACUATION  12/30/2007   missed ab  . LAPAROSCOPIC GELPORT ASSISTED MYOMECTOMY N/A 06/19/2016   Procedure: LAPAROSCOPIC GELPORT ASSISTED MYOMECTOMY;  Surgeon: Governor Specking, MD;  Location: [facility];  Service: Gynecology;  Laterality: N/A;  . LAPAROSCOPY     MYOMECTOMY  . OVARIAN CYST REMOVAL Left 06/19/2016   Procedure: LEFT OVARIAN CYSTECTOMY;  Surgeon: Governor Specking, MD;  Location: [facility];  Service: Gynecology;  Laterality: Left;  . TRANSTHORACIC ECHOCARDIOGRAM  01/08/2014   ef 56%/  mild to moderater TR/  trivial PR    Family History  Problem Relation Age of Onset  . Thyroid disease Mother   . Hypertension Mother   . Hypertension Maternal Grandmother   . Thyroid disease Maternal Grandmother   . Kidney disease Maternal Grandmother   . Heart attack Maternal Grandfather   . Cancer Maternal Grandfather        lymph nodes  . Cancer Paternal Grandmother        stomach  . Heart disease Paternal Grandfather        Psychologist, forensic  . Hypotension Paternal Grandfather   . Diabetes Maternal Aunt     Allergies  Allergen Reactions  . Codeine Other (See Comments)    "palpitations and heart races"    Current Outpatient Medications on File Prior to Visit  Medication Sig Dispense  Refill  . ondansetron (ZOFRAN) 4 MG tablet Take 1 tablet (4 mg total) by mouth every 6 (six) hours as needed for nausea or vomiting. 12 tablet 0  . Prenatal Vit-Fe Fumarate-FA (MULTIVITAMIN-PRENATAL) 27-0.8 MG TABS tablet Take 1 tablet by mouth daily.      No current facility-administered medications on file prior to visit.    BP 128/86   Pulse 99   Temp (!) 97.5 F (36.4 C) (Temporal)   Ht 5' 4.5" (1.638 m)   Wt 130 lb 8 oz (59.2 kg)   LMP 08/02/2019   SpO2 98%   BMI 22.05 kg/m    Objective:   Physical Exam  Constitutional: She appears well-nourished.  Cardiovascular:  Normal rate and regular rhythm.  Respiratory: Effort normal and breath sounds normal.  Musculoskeletal:     Cervical back: Neck supple.  Skin: Skin is warm and dry.  Psychiatric: She has a normal mood and affect.           Assessment & Plan:

## 2019-08-26 NOTE — Patient Instructions (Addendum)
Start sertraline (Zoloft) 50 mg tablets for anxiety and depression. Start by taking 1/2 tablet daily for 8 days, then increase to 1 full tablet thereafter.  You may take the hydroxyzine 10 mg tablets as needed. Take 1-2 tablets up to twice daily as needed for anxiety.  You will be contacted regarding your referral to therapy.  Please let us know if you have not been contacted within two weeks.   Please schedule a follow up visit for 6 weeks as discussed.   It was a pleasure to see you today!

## 2019-08-26 NOTE — Assessment & Plan Note (Signed)
GAD 7 score of 16 and PHQ 9 score of 16 today. Chronic history of anxiety symptoms, never any treatment.  Given increased life stressors we decided that it's time to treat. Discussed options, she opts for therapy and medication. Referral placed for therapy.   Rx for Zoloft 50 mg sent to pharmacy. Patient is to take 1/2 tablet daily for 8 days, then advance to 1 full tablet thereafter. We discussed possible side effects of headache, GI upset, drowsiness, and SI/HI. If thoughts of SI/HI develop, we discussed to present to the emergency immediately. Patient verbalized understanding.   Follow up in 6 weeks for re-evaluation.

## 2019-08-26 NOTE — Assessment & Plan Note (Signed)
Continued and likely secondary to anxiety.  Has had cardiology evaluation in the past. Check labs to rule out metabolic cause.

## 2019-08-27 ENCOUNTER — Other Ambulatory Visit (INDEPENDENT_AMBULATORY_CARE_PROVIDER_SITE_OTHER): Payer: 59

## 2019-08-27 DIAGNOSIS — D72829 Elevated white blood cell count, unspecified: Secondary | ICD-10-CM

## 2019-08-27 LAB — WHITE CELL DIFFERENTIAL
Basophils Relative: 0.5 % (ref 0.0–3.0)
Eosinophils Relative: 0.4 % (ref 0.0–5.0)
Lymphocytes Relative: 16.5 % (ref 12.0–46.0)
Monocytes Relative: 5.5 % (ref 3.0–12.0)
Neutrophils Relative %: 77.1 % — ABNORMAL HIGH (ref 43.0–77.0)

## 2019-08-30 DIAGNOSIS — F411 Generalized anxiety disorder: Secondary | ICD-10-CM

## 2019-08-30 DIAGNOSIS — R110 Nausea: Secondary | ICD-10-CM

## 2019-08-31 MED ORDER — ESCITALOPRAM OXALATE 10 MG PO TABS: 10.0000 mg | ORAL_TABLET | Freq: Every day | ORAL | 1 refills | Status: DC

## 2019-08-31 NOTE — Telephone Encounter (Signed)
[place] Night - Client TELEPHONE ADVICE RECORD [facility] Patient Name: Casey Clarke Gender: Female DOB: [birth year] Age: 39 Y 70 M 1 D Return Phone Number: [phone] (Primary) [place] [address] Client [place] Night - Client Client Site [place] Physician Alma Friendly - NP Contact Type Call Who Is Calling Patient / Member / Family / Caregiver Call Type Triage / Clinical Relationship To Patient Self Return Phone Number [phone] (Primary) Chief Complaint Vomiting Reason for Call Symptomatic / Request for [place] states, trying to take zoloft and making her worse. Having nausea, vomiting,diarrhea, feels horrible. Has urinated in 8 hrs. Translation No Nurse Assessment Nurse: Jac Canavan, RN, Estill Bamberg Date/Time (Eastern Time): 08/30/2019 8:35:51 AM Confirm and document reason for call. If symptomatic, describe symptoms. ---Caller states she is having nausea, vomiting and diarrhea since starting Zoloft four days ago. She is unable to eat anything. Zoloft dose is 25mg  daily. Has the patient had close contact with a person known or suspected to have the novel coronavirus illness OR traveled / lives in area with major community spread (including international travel) in the last 14 days from the onset of symptoms? * If Asymptomatic, screen for exposure and travel within the last 14 days. ---No Does the patient have any new or worsening symptoms? ---Yes Will a triage be completed? ---Yes Related visit to physician within the last 2 weeks? ---No Does the PT have any chronic conditions? (i.e. diabetes, asthma, this includes High risk factors for pregnancy, etc.) ---No Is the patient pregnant or possibly pregnant? (Ask all females between the ages of 64-55) ---Yes What is the estimated delivery date? ---0001-01-01 Total number of  pregnancies including current? ---0 Number of live births? ---0 Have you felt decreased fetal movement? ---No Is this a behavioral health or substance abuse call? ---No PLEASE NOTE: All timestamps contained within this report are represented as [place] Standard Time. CONFIDENTIALTY NOTICE: This fax transmission is intended only for the addressee. It contains information that is legally privileged, confidential or otherwise protected from use or disclosure. If you are not the intended recipient, you are strictly prohibited from reviewing, disclosing, copying using or disseminating any of this information or taking any action in reliance on or regarding this information. If you have received this fax in error, please notify us immediately by telephone so that we can arrange for its return to [place]. Phone: [phone], Toll-Free: [phone], Fax: [phone] Page: 2 of 2 Call Id: [id number] Guidelines Guideline Title Affirmed Question Affirmed Notes Nurse Date/Time Eilene Ghazi Time) Heart Rate and Heartbeat Questions Problems with anxiety or stress Maryellen Pile 08/30/2019 8:38:41 AM Nausea Taking prescription medication that could cause nausea (e.g., narcotics/opiates, antibiotics, OCPs, many others) Jac Canavan, RN, Estill Bamberg 08/30/2019 8:41:51 AM Disp. Time Eilene Ghazi Time) Disposition Final User 08/30/2019 8:41:06 AM See PCP within 2 Alphonzo Dublin, RN, [facility] 08/30/2019 8:47:16 AM Called On-Call Provider Jac Canavan, RN, Estill Bamberg 08/30/2019 8:44:03 AM Call PCP within 24 Hours Yes Jac Canavan, RN, Shelly Coss Disagree/Comply Comply Caller Understands Yes PreDisposition [place] Advice Given Per Guideline SEE PCP WITHIN 2 WEEKS: CALL PCP WITHIN 24 HOURS: CARE ADVICE given per Nausea (Adult) guideline. * You become worse. CALL BACK IF: Paging DoctorName Phone DateTime Result/Outcome Message Type Notes Dimas Chyle- MD [id number] 08/30/2019 8:47:16 AM Called On Call Provider - Reached Doctor Paged Dimas Chyle- MD 08/30/2019 8:47:43 AM Spoke with On Call - General Message Result OK to stop  Zoloft, follow up with PCP early in the week to discuss

## 2019-09-01 MED ORDER — ALPRAZOLAM 0.25 MG PO TABS: 0.2500 mg | ORAL_TABLET | Freq: Every day | ORAL | 0 refills | Status: DC | PRN

## 2019-09-02 MED ORDER — ONDANSETRON HCL 4 MG PO TABS: 4.0000 mg | ORAL_TABLET | Freq: Three times a day (TID) | ORAL | 0 refills | Status: DC | PRN

## 2019-09-04 NOTE — Telephone Encounter (Signed)
Rosaria Ferries, do you have the number?

## 2019-10-07 ENCOUNTER — Other Ambulatory Visit: Payer: Self-pay

## 2019-10-07 ENCOUNTER — Telehealth (INDEPENDENT_AMBULATORY_CARE_PROVIDER_SITE_OTHER): Payer: 59 | Admitting: Primary Care

## 2019-10-07 ENCOUNTER — Encounter: Payer: Self-pay | Admitting: Primary Care

## 2019-10-07 DIAGNOSIS — F411 Generalized anxiety disorder: Secondary | ICD-10-CM | POA: Diagnosis not present

## 2019-10-07 MED ORDER — ESCITALOPRAM OXALATE 10 MG PO TABS: 10.0000 mg | ORAL_TABLET | Freq: Every day | ORAL | 2 refills | Status: DC

## 2019-10-07 NOTE — Progress Notes (Signed)
Subjective:    Patient ID: Casey Clarke, female    DOB: [birth year], 39 y.o.   MRN: [id number]  HPI  Virtual Visit via Video Note  I connected with [name] on 10/07/19 at 10:40 AM EDT by a video enabled telemedicine application and verified that I am speaking with the correct person using two identifiers.  Location: Patient: Home Provider: Office   I discussed the limitations of evaluation and management by telemedicine and the availability of in person appointments. The patient expressed understanding and agreed to proceed.  History of Present Illness:  Ms. Casey Clarke is a 39 year old female with a history of renal stones, GAD, palpitations who presents today for follow up of anxiety.  She was last evaluated in early February 2021 for chronic symptoms of anxiety that had increased since Covid-19 diagnosis. GAD 7 score of 16 with PHQ 9 score of 16 so we initiated Zoloft 50 mg and asked her to follow up today. She decided early on that she'd like to avoid Zoloft so we switched her to Lexapro 10 mg.   Since her last visit she's doing much better, feeling like her old self again. She's not sure if she had "post covid syndrome" which she describes as increased anxiety, palpations, nausea that occurs after Covid-19 infection. She would like to continue with Lexapro as she is in NP school and is feeling better.   Observations/Objective:  Alert and oriented. Appears much better today, less anxious.  No distress. Speaking in complete sentences.   Assessment and Plan:  Improved and at goal on Lexapro 10 mg.  No side effects. Continue same. Refills sent to pharmacy.  Follow Up Instructions:  Continue Lexapro 10 mg daily for anxiety.  It was a pleasure to see you today! Allie Bossier, NP-C    I discussed the assessment and treatment plan with the patient. The patient was provided an opportunity to ask questions and all were answered. The patient agreed with the plan and  demonstrated an understanding of the instructions.   The patient was advised to call back or seek an in-person evaluation if the symptoms worsen or if the condition fails to improve as anticipated.     Pleas Koch, NP    Review of Systems  Cardiovascular: Negative for palpitations.  Gastrointestinal: Negative for nausea.  Psychiatric/Behavioral: The patient is not nervous/anxious.        Past Medical History:  Diagnosis Date  . Abnormal Pap smear of cervix 2014, 03/2014   ASCUS, pos HR HPV, 18/45; 2015 LGSIL, + HR HPV  . AMA (advanced maternal age) primigravida 64+   . Dermoid cyst of ovary, left 03/2016  . Fibroid   . GERD (gastroesophageal reflux disease)   . H/O myomectomy 06/16/2018  . Heart murmur   . Heterozygous MTHFR mutation C677T ([place])   . History of gastric ulcer    remote hx - resolved  . History of kidney stones   . History of palpitations    hypersenitive to codeine and per pt sometimes tachy  . History of recurrent miscarriages   . 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 Infertility, female   . Migraine   . Nocturia   . PONV (postoperative nausea and vomiting)   . Postpartum care following cesarean delivery (11/25) 06/16/2018  . Pregnancy with history of uterine myomectomy 06/16/2018  . S/P cesarean section 06/16/2018     Social History   Socioeconomic History  . Marital status: Married    Spouse  name: Not on file  . Number of children: 0  . Years of education: Not on file  . Highest education level: Not on file  Occupational History  . Not on file  Tobacco Use  . Smoking status: Never Smoker  . Smokeless tobacco: Never Used  Substance and Sexual Activity  . Alcohol use: Yes    Alcohol/week: 0.0 standard drinks    Comment: social  . Drug use: No  . Sexual activity: Yes    Partners: Male    Birth control/protection: None  Other Topics Concern  . Not on file  Social History Narrative  . Not on file   Social Determinants of Health    Financial Resource Strain:   . Difficulty of Paying Living Expenses:   Food Insecurity:   . Worried About Charity fundraiser in the Last Year:   . Arboriculturist in the Last Year:   Transportation Needs:   . Film/video editor (Medical):   Marland Kitchen Lack of Transportation (Non-Medical):   Physical Activity:   . Days of Exercise per Week:   . Minutes of Exercise per Session:   Stress:   . Feeling of Stress :   Social Connections:   . Frequency of Communication with Friends and Family:   . Frequency of Social Gatherings with Friends and Family:   . Attends Religious Services:   . Active Member of Clubs or Organizations:   . Attends Archivist Meetings:   Marland Kitchen Marital Status:   Intimate Partner Violence:   . Fear of Current or Ex-Partner:   . Emotionally Abused:   Marland Kitchen Physically Abused:   . Sexually Abused:     Past Surgical History:  Procedure Laterality Date  . AUGMENTATION MAMMAPLASTY Bilateral 01/17/10   implants  . CESAREAN SECTION N/A 06/16/2018   Procedure: Primary CESAREAN SECTION;  Surgeon: Brien Few, MD;  Location: [place];  Service: Obstetrics;  Laterality: N/A;  EDD: 07/07/18 Allergy: Codeine  . CHROMOPERTUBATION N/A 06/19/2016   Procedure: CHROMOPERTUBATION;  Surgeon: Governor Specking, MD;  Location: [facility];  Service: Gynecology;  Laterality: N/A;  . COLPOSCOPY  2014, 2015   2015 LGSIL, neg ECC  . DILATION AND EVACUATION  12/30/2007   missed ab  . LAPAROSCOPIC GELPORT ASSISTED MYOMECTOMY N/A 06/19/2016   Procedure: LAPAROSCOPIC GELPORT ASSISTED MYOMECTOMY;  Surgeon: Governor Specking, MD;  Location: [place];  Service: Gynecology;  Laterality: N/A;  . LAPAROSCOPY     MYOMECTOMY  . OVARIAN CYST REMOVAL Left 06/19/2016   Procedure: LEFT OVARIAN CYSTECTOMY;  Surgeon: Governor Specking, MD;  Location: [place];  Service: Gynecology;  Laterality: Left;  . TRANSTHORACIC ECHOCARDIOGRAM   01/08/2014   ef 56%/  mild to moderater TR/  trivial PR    Family History  Problem Relation Age of Onset  . Thyroid disease Mother   . Hypertension Mother   . Hypertension Maternal Grandmother   . Thyroid disease Maternal Grandmother   . Kidney disease Maternal Grandmother   . Heart attack Maternal Grandfather   . Cancer Maternal Grandfather        lymph nodes  . Cancer Paternal Grandmother        stomach  . Heart disease Paternal Grandfather        Psychologist, forensic  . Hypotension Paternal Grandfather   . Diabetes Maternal Aunt     Allergies  Allergen Reactions  . Codeine Other (See Comments)    "palpitations and heart  races"    Current Outpatient Medications on File Prior to Visit  Medication Sig Dispense Refill  . ALPRAZolam (XANAX) 0.25 MG tablet Take 1 tablet (0.25 mg total) by mouth daily as needed for anxiety. 10 tablet 0  . hydrOXYzine (ATARAX/VISTARIL) 10 MG tablet Take 1 to 2 tablets by mouth twice daily as needed for anxiety/rest. 30 tablet 0  . ondansetron (ZOFRAN) 4 MG tablet Take 1 tablet (4 mg total) by mouth every 8 (eight) hours as needed for nausea or vomiting. 12 tablet 0  . Prenatal Vit-Fe Fumarate-FA (MULTIVITAMIN-PRENATAL) 27-0.8 MG TABS tablet Take 1 tablet by mouth daily.      No current facility-administered medications on file prior to visit.    LMP 09/29/2019    Objective:   Physical Exam  Constitutional: She is oriented to person, place, and time. She appears well-nourished.  Cardiovascular: Normal rate and regular rhythm.  Respiratory: Effort normal.  Musculoskeletal:     Cervical back: Neck supple.  Neurological: She is alert and oriented to person, place, and time.  Psychiatric: She has a normal mood and affect.  Appears much calmer today           Assessment & Plan:

## 2019-10-07 NOTE — Assessment & Plan Note (Signed)
Improved and at goal on Lexapro 10 mg.  No side effects. Continue same. Refills sent to pharmacy.

## 2019-10-14 ENCOUNTER — Ambulatory Visit: Payer: 59 | Admitting: Psychology

## 2019-10-21 ENCOUNTER — Ambulatory Visit (INDEPENDENT_AMBULATORY_CARE_PROVIDER_SITE_OTHER): Payer: 59 | Admitting: Psychology

## 2019-10-21 DIAGNOSIS — F4322 Adjustment disorder with anxiety: Secondary | ICD-10-CM

## 2019-10-28 ENCOUNTER — Ambulatory Visit: Payer: 59 | Admitting: Psychology

## 2019-11-05 DIAGNOSIS — Z111 Encounter for screening for respiratory tuberculosis: Secondary | ICD-10-CM

## 2019-11-11 ENCOUNTER — Other Ambulatory Visit (INDEPENDENT_AMBULATORY_CARE_PROVIDER_SITE_OTHER): Payer: 59

## 2019-11-11 DIAGNOSIS — Z111 Encounter for screening for respiratory tuberculosis: Secondary | ICD-10-CM | POA: Diagnosis not present

## 2019-11-13 LAB — QUANTIFERON-TB GOLD PLUS
Mitogen-NIL: >10 IU/mL
NIL: 0.01 IU/mL
QuantiFERON-TB Gold Plus: NEGATIVE
TB1-NIL: 0 IU/mL
TB2-NIL: 0.01 IU/mL

## 2019-12-14 IMAGING — US US TRANSVAGINAL NON-OB
1 series · 13 of 25 positions shown · non-contrast
Comparison: CT of the abdomen and pelvis performed 07/15/2016

CLINICAL DATA: Acute onset of right lower quadrant abdominal pain.

EXAM:
TRANSABDOMINAL AND TRANSVAGINAL ULTRASOUND OF PELVIS
TECHNIQUE: Both transabdominal and transvaginal ultrasound examinations of the
pelvis were performed. Transabdominal technique was performed for
global imaging of the pelvis including uterus, ovaries, adnexal
regions, and pelvic cul-de-sac. It was necessary to proceed with
endovaginal exam following the transabdominal exam to visualize the
uterus and ovaries in greater detail.

[Series 1: us transvaginal non-ob · 0.24mm/px · 13 of 35 slices shown]
[im 1/35]
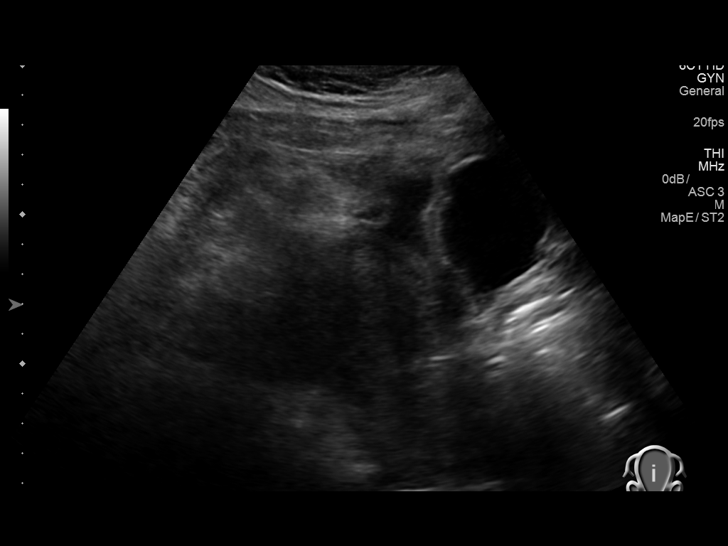
[im 3/35]
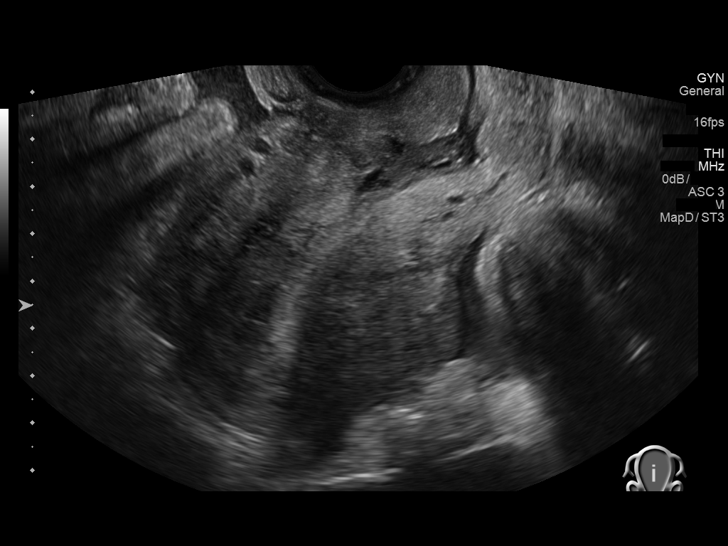
[im 6/35]
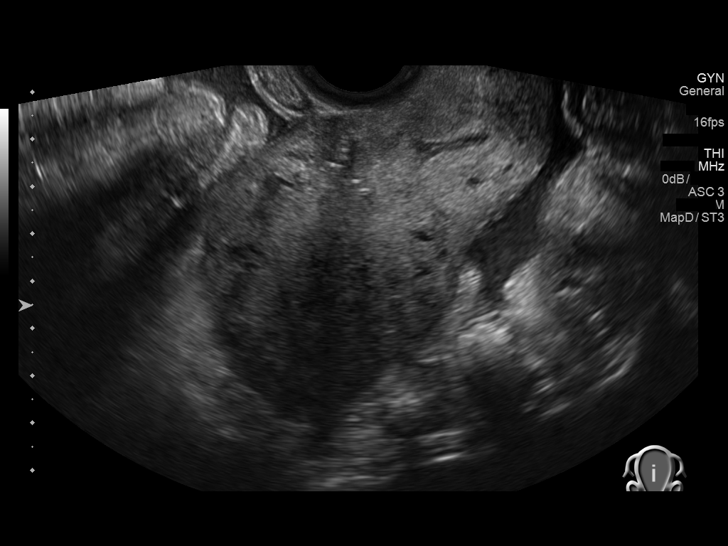
[im 9/35]
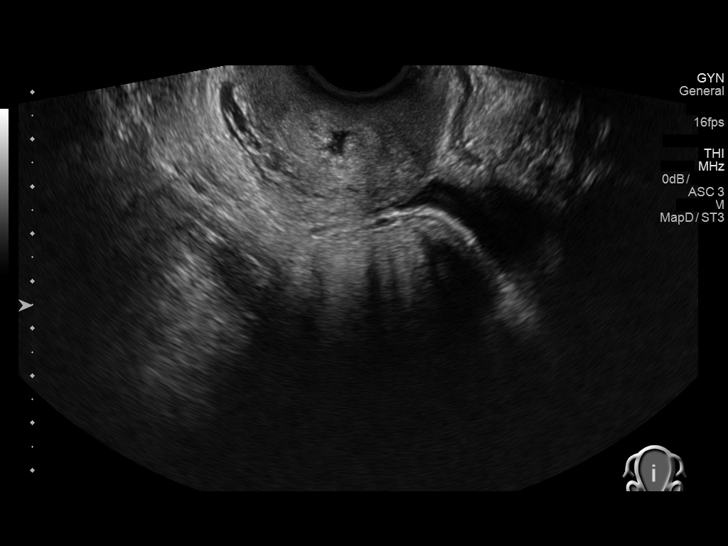
[im 12/35]
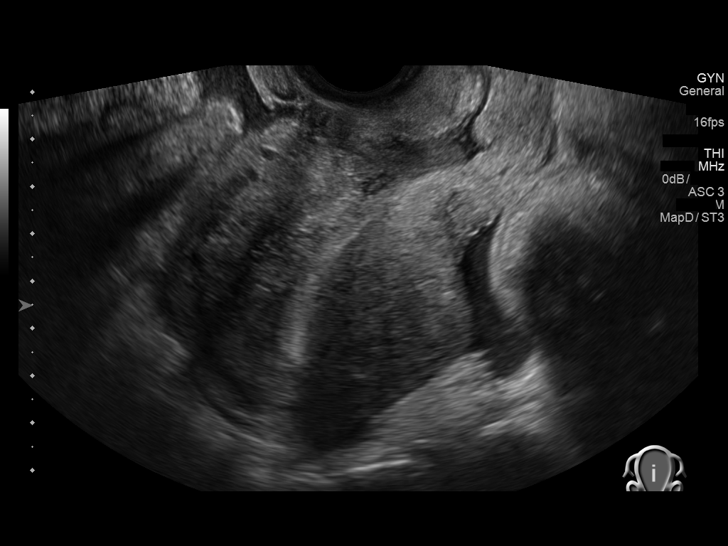
[im 15/35]
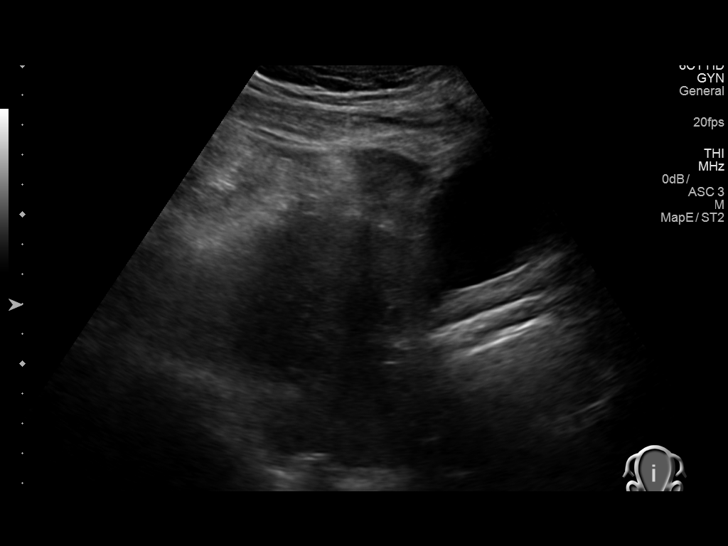
[im 18/35]
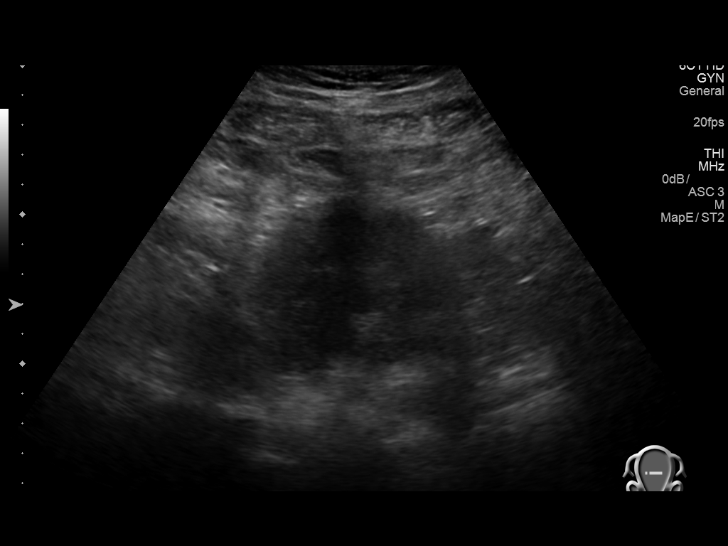
[im 20/35]
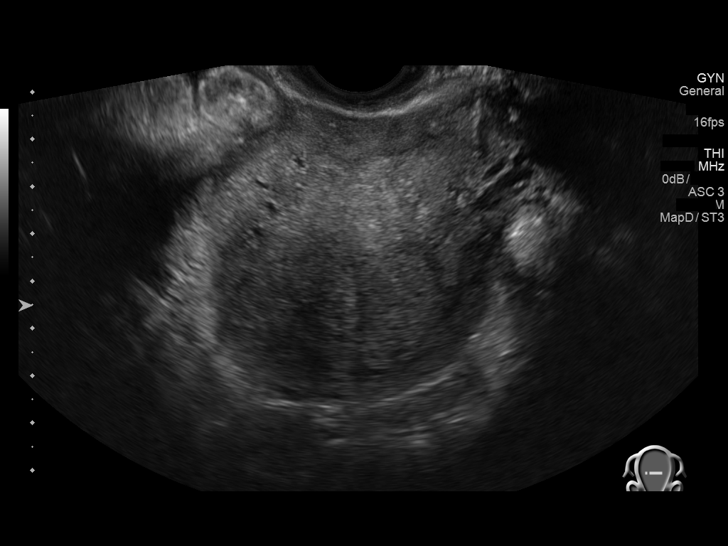
[im 23/35]
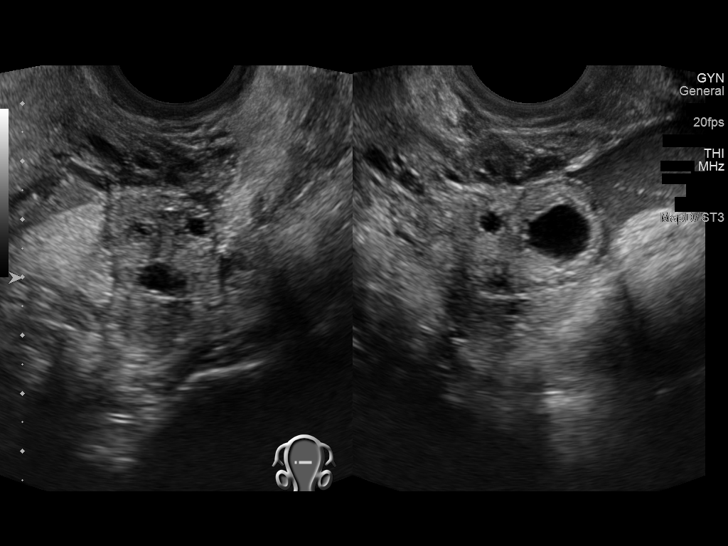
[im 26/35]
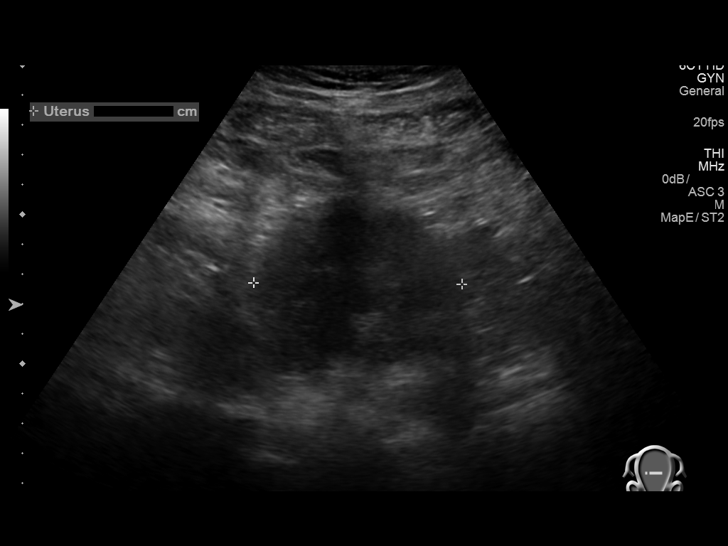
[im 29/35]
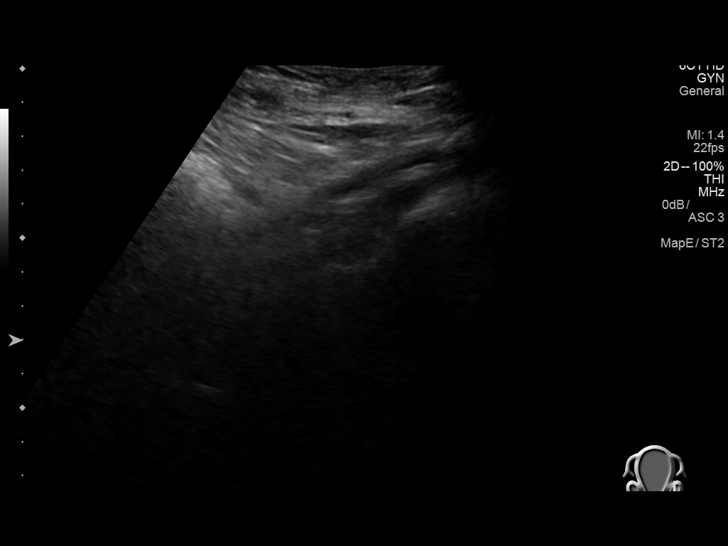
[im 32/35]
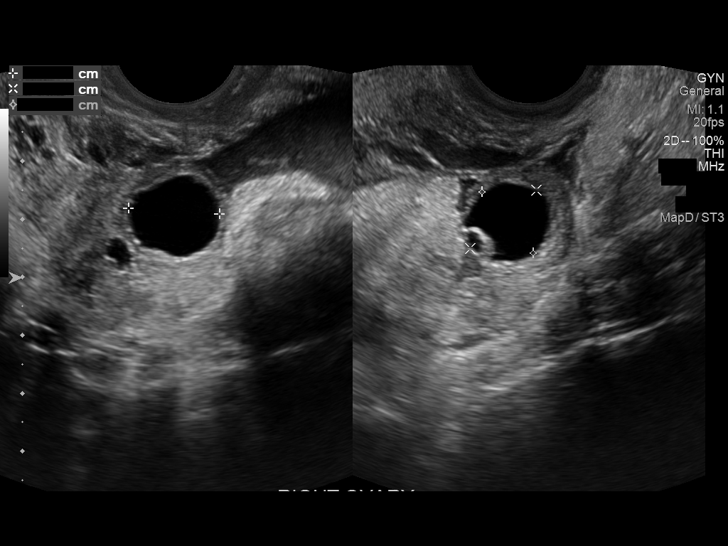
[im 35/35]
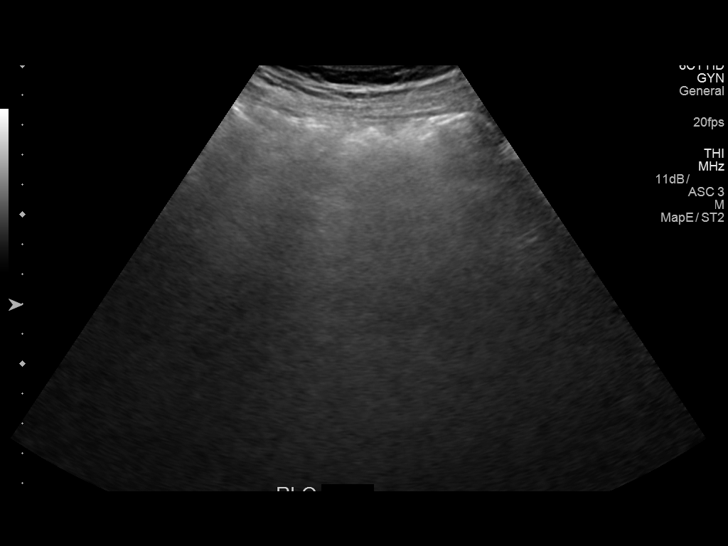

[13 of 25 positions shown; findings below may reference images not displayed]

FINDINGS: Uterus

Measurements: 10.4 x 6.4 x 7.0 cm = volume: 244 mL. No fibroids or
other mass visualized. A small amount of complex free fluid is noted
within the cervical canal.

Endometrium

Thickness: 0.7 cm.  No focal abnormality visualized.

Right ovary

Measurements: 3.5 x 2.3 x 1.8 cm = volume: 7.8 mL. Normal
appearance/no adnexal mass.

Left ovary

Measurements: 2.7 x 1.6 x 1.8 cm = volume: 3.9 mL. Normal
appearance/no adnexal mass.

Other findings

A small amount of complex free fluid is seen within the pelvis, of
uncertain significance.
IMPRESSION: 1. No acute abnormality seen to explain the patient's symptoms.
2. Small amount of complex free fluid within the pelvis, of
uncertain significance. The patient has a negative beta hCG.
3. Small amount of complex free fluid in the cervical canal may
reflect residual mild clot.

## 2020-02-25 NOTE — Progress Notes (Signed)
39 y.o. [id number] Married White or Caucasian female here for annual exam.  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  Is working on her AG(adult geriatric) NP but this is doctoral program so three full years.  She has two years left.  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    Cycles are regular.  She is interested about starting to try again for pregnancy.  She's had some increased anxiety this year.  Has been on Lexapro and this has really helped.  She has questions about this in relation to pregnancy risk.  Not sure she feels coming off is a good idea.  Reviewed up to date guidelines with her.      Entire family got Covid but they've all done well.  She has recovered.  Has not had covid vaccination.  She is considering being vaccinated once she is pregnant, maybe in the second trimester.    Patient's last menstrual period was 02/22/2020 (exact date).          Sexually active: No.  The current method of family planning is none.    Exercising: Yes.    walking Smoker:  no  Health Maintenance: Pap:  05-01-16 ASCUS HPV HR neg, 06-04-17 neg HPV HR + 18/45+, 10-30-2018 neg HPV HR neg History of abnormal Pap:  yes MMG:  none Colonoscopy:  none BMD:   none TDaP:  2013 Pneumonia vaccine(s):  no Shingrix:   no Hep C testing: not done Screening Labs: will do [facility]   reports that she has never smoked. She has never used smokeless tobacco. She reports current alcohol use of about 1.0 - 2.0 standard drink of alcohol per week. She reports that she does not use drugs.  Past Medical History:  Diagnosis Date  . Abnormal Pap smear of cervix 2014, 03/2014   ASCUS, pos HR HPV, 18/45; 2015 LGSIL, + HR HPV  . AMA (advanced maternal age) primigravida 73+   . Dermoid cyst of ovary, left 03/2016  . Fibroid   . GERD (gastroesophageal reflux disease)   . H/O myomectomy 06/16/2018  . Heart murmur   . Heterozygous MTHFR mutation C677T ([place])   . History of gastric ulcer     remote hx - resolved  . History of kidney stones   . History of palpitations    hypersenitive to codeine and per pt sometimes tachy  . History of recurrent miscarriages   . 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 Infertility, female   . Migraine   . Nocturia   . PONV (postoperative nausea and vomiting)   . Postpartum care following cesarean delivery (11/25) 06/16/2018  . Pregnancy with history of uterine myomectomy 06/16/2018  . S/P cesarean section 06/16/2018    Past Surgical History:  Procedure Laterality Date  . AUGMENTATION MAMMAPLASTY Bilateral 01/17/10   implants  . CESAREAN SECTION N/A 06/16/2018   Procedure: Primary CESAREAN SECTION;  Surgeon: Brien Few, MD;  Location: [place];  Service: Obstetrics;  Laterality: N/A;  EDD: 07/07/18 Allergy: Codeine  . CHROMOPERTUBATION N/A 06/19/2016   Procedure: CHROMOPERTUBATION;  Surgeon: Governor Specking, MD;  Location: [facility];  Service: Gynecology;  Laterality: N/A;  . COLPOSCOPY  2014, 2015   2015 LGSIL, neg ECC  . DILATION AND EVACUATION  12/30/2007   missed ab  . LAPAROSCOPIC GELPORT ASSISTED MYOMECTOMY N/A 06/19/2016   Procedure: LAPAROSCOPIC GELPORT ASSISTED MYOMECTOMY;  Surgeon: Governor Specking, MD;  Location: [facility];  Service: Gynecology;  Laterality: N/A;  . LAPAROSCOPY     MYOMECTOMY  . OVARIAN CYST REMOVAL Left 06/19/2016   Procedure: LEFT OVARIAN CYSTECTOMY;  Surgeon: Governor Specking, MD;  Location: [place];  Service: Gynecology;  Laterality: Left;  . TRANSTHORACIC ECHOCARDIOGRAM  01/08/2014   ef 56%/  mild to moderater TR/  trivial PR    Current Outpatient Medications  Medication Sig Dispense Refill  . escitalopram (LEXAPRO) 10 MG tablet Take 1 tablet (10 mg total) by mouth daily. For anxiety. 90 tablet 2  . Multiple Vitamin (MULTIVITAMIN PO) Take by mouth.    . ALPRAZolam (XANAX) 0.25 MG tablet Take 1 tablet (0.25 mg total) by mouth daily as needed  for anxiety. (Patient not taking: Reported on 02/26/2020) 10 tablet 0   No current facility-administered medications for this visit.    Family History  Problem Relation Age of Onset  . Thyroid disease Mother   . Hypertension Mother   . Hypertension Maternal Grandmother   . Thyroid disease Maternal Grandmother   . Kidney disease Maternal Grandmother   . Heart attack Maternal Grandfather   . Cancer Maternal Grandfather        lymph nodes  . Cancer Paternal Grandmother        stomach  . Heart disease Paternal Grandfather        Psychologist, forensic  . Hypotension Paternal Grandfather   . Diabetes Maternal Aunt     Review of Systems  Constitutional: Negative.   HENT: Negative.   Eyes: Negative.   Respiratory: Negative.   Cardiovascular: Negative.   Gastrointestinal: Negative.   Endocrine: Negative.   Genitourinary: Negative.   Musculoskeletal: Negative.   Skin: Negative.   Allergic/Immunologic: Negative.   Neurological: Negative.   Hematological: Negative.   Psychiatric/Behavioral: Negative.     Exam:   BP 120/78   Pulse 68   Resp 16   Ht 5' 4.5" (1.638 m)   Wt 135 lb (61.2 kg)   LMP 02/22/2020 (Exact Date)   BMI 22.81 kg/m   Height: 5' 4.5" (163.8 cm)  General appearance: alert, cooperative and appears stated age Head: Normocephalic, without obvious abnormality, atraumatic Neck: no adenopathy, supple, symmetrical, trachea midline and thyroid normal to inspection and palpation Lungs: clear to auscultation bilaterally Breasts: normal appearance, no masses or tenderness Heart: regular rate and rhythm Abdomen: soft, non-tender; bowel sounds normal; no masses,  no organomegaly Extremities: extremities normal, atraumatic, no cyanosis or edema Skin: Skin color, texture, turgor normal. No rashes or lesions Lymph nodes: Cervical, supraclavicular, and axillary nodes normal. No abnormal inguinal nodes palpated Neurologic: Grossly normal   Pelvic: External genitalia:  no  lesions              Urethra:  normal appearing urethra with no masses, tenderness or lesions              Bartholins and Skenes: normal                 Vagina: normal appearing vagina with normal color and discharge, no lesions              Cervix: no lesions              Pap taken: Yes.   Bimanual Exam:  Uterus:  normal size, contour, position, consistency, mobility, non-tender              Adnexa: normal adnexa and no mass, fullness, tenderness  Rectovaginal: Confirms               Anus:  normal sphincter tone, no lesions  Chaperone, Olene Floss, CMA, was present for exam.  A:  Well Woman with normal exam H/o fibroid uterus, s/p myomectomy H/o infertility with evaluation and treatment, then spontaneous pregnancy when took a break from fertility treatment H/o migraines H/o MTHFR heterogynous gene mutation Renal stones H/o left dermoid cyst H/o 3 unexplained miscarriages, evaluation done with Dr. Kerin Perna  P:   Mammogram guidelines reviewed.  Will consider starting at age 49 depending on pregnancy and nursing status pap smear with HR HPV obtained today due to hx +HR HPV Will check with heme/onc about folate vs folic acid Also will check with Dr. Kerin Perna about ASA with attempting another pregnancy AMH obtained today to assess fertility Return annually or prn

## 2020-02-26 ENCOUNTER — Other Ambulatory Visit (HOSPITAL_COMMUNITY)
Admission: RE | Admit: 2020-02-26 | Discharge: 2020-02-26 | Disposition: A | Discharge status: Home / Self Care | Payer: 59 | Source: Ambulatory Visit | Attending: Obstetrics & Gynecology | Admitting: Obstetrics & Gynecology

## 2020-02-26 ENCOUNTER — Encounter: Payer: Self-pay | Admitting: Obstetrics & Gynecology

## 2020-02-26 ENCOUNTER — Other Ambulatory Visit: Payer: Self-pay

## 2020-02-26 ENCOUNTER — Ambulatory Visit: Payer: 59 | Admitting: Obstetrics & Gynecology

## 2020-02-26 VITALS — BP 120/78 | HR 68 | Resp 16 | Ht 64.5 in | Wt 135.0 lb

## 2020-02-26 DIAGNOSIS — Z01419 Encounter for gynecological examination (general) (routine) without abnormal findings: Secondary | ICD-10-CM | POA: Insufficient documentation

## 2020-02-26 DIAGNOSIS — B977 Papillomavirus as the cause of diseases classified elsewhere: Secondary | ICD-10-CM | POA: Insufficient documentation

## 2020-02-26 DIAGNOSIS — Z124 Encounter for screening for malignant neoplasm of cervix: Secondary | ICD-10-CM | POA: Insufficient documentation

## 2020-02-26 DIAGNOSIS — Z3141 Encounter for fertility testing: Secondary | ICD-10-CM

## 2020-02-29 ENCOUNTER — Encounter: Payer: Self-pay | Admitting: Obstetrics & Gynecology

## 2020-02-29 LAB — CYTOLOGY - PAP
Comment: NEGATIVE
Diagnosis: NEGATIVE
High risk HPV: NEGATIVE

## 2020-03-01 ENCOUNTER — Telehealth: Payer: Self-pay | Admitting: Obstetrics & Gynecology

## 2020-03-01 MED ORDER — FOLIC ACID 1 MG PO TABS: 2.0000 mg | ORAL_TABLET | Freq: Every day | ORAL | 0 refills | Status: DC

## 2020-03-01 NOTE — Telephone Encounter (Signed)
Patient sent the following message via MyChart.  Do you prescribe the folic acid or would you rather do OTC?

## 2020-03-01 NOTE — Telephone Encounter (Signed)
Spoke with pt. Pt wanting to know if can take OTC folic acid as Dr Marin Olp had recommended or does pt need Rx?  Pt states would have to take 5 tablets every day of folic acid if takes OTC. Pt prefers Rx.  Rx Folvite 2mg  tablets, # 90, 0RF sent to pharmacy on file. Pt now only has to take 2 tablets daily. Pt askied for 90 day supply.  Advised will give update to Dr Sabra Heck and return call if any further advice. Pt agreeable.   Routing to Dr Sabra Heck for update   Sabra Heck Lemmie Evens, MD to [name]     9:46 AM Mrs. Boesen, Dr. Marin Olp said 2mg  folic acid is what you should be taking.  You can start this now.  I'll let you know when I hear back from Dr. Kerin Perna.    Edwinna Areola

## 2020-03-03 LAB — ANTI MULLERIAN HORMONE: ANTI-MULLERIAN HORMONE (AMH): 1.93 ng/mL

## 2020-03-11 ENCOUNTER — Other Ambulatory Visit: Payer: Self-pay

## 2020-03-11 ENCOUNTER — Other Ambulatory Visit (INDEPENDENT_AMBULATORY_CARE_PROVIDER_SITE_OTHER): Payer: 59

## 2020-03-11 DIAGNOSIS — Z862 Personal history of diseases of the blood and blood-forming organs and certain disorders involving the immune mechanism: Secondary | ICD-10-CM | POA: Diagnosis not present

## 2020-03-11 DIAGNOSIS — Z1152 Encounter for screening for COVID-19: Secondary | ICD-10-CM

## 2020-03-11 LAB — CBC WITH DIFFERENTIAL/PLATELET
Basophils Absolute: 0.1 10*3/uL (ref 0.0–0.1)
Basophils Relative: 0.5 % (ref 0.0–3.0)
Eosinophils Absolute: 0.3 10*3/uL (ref 0.0–0.7)
Eosinophils Relative: 2.8 % (ref 0.0–5.0)
HCT: 43.1 % (ref 36.0–46.0)
Hemoglobin: 14.5 g/dL (ref 12.0–15.0)
Lymphocytes Relative: 28 % (ref 12.0–46.0)
Lymphs Abs: 2.7 10*3/uL (ref 0.7–4.0)
MCHC: 33.6 g/dL (ref 30.0–36.0)
MCV: 90.3 fl (ref 78.0–100.0)
Monocytes Absolute: 0.7 10*3/uL (ref 0.1–1.0)
Monocytes Relative: 6.8 % (ref 3.0–12.0)
Neutro Abs: 6 10*3/uL (ref 1.4–7.7)
Neutrophils Relative %: 61.9 % (ref 43.0–77.0)
Platelets: 331 10*3/uL (ref 150.0–400.0)
RBC: 4.77 Mil/uL (ref 3.87–5.11)
RDW: 13.4 % (ref 11.5–15.5)
WBC: 9.7 10*3/uL (ref 4.0–10.5)

## 2020-03-11 LAB — SARS-COV-2 IGG: SARS-COV-2 IgG: 0.51

## 2020-03-25 ENCOUNTER — Other Ambulatory Visit: Payer: Self-pay | Admitting: Obstetrics & Gynecology

## 2020-03-25 NOTE — Telephone Encounter (Signed)
Medication refill request: Folic Acid  Last AEX:  02-26-20 SM  Next AEX: not scheduled  Last MMG (if hormonal medication request): n/a Refill authorized: Today, please advise.   Medication pended for #60, 1RF. Please refill if appropriate.

## 2020-04-14 LAB — OB RESULTS CONSOLE ANTIBODY SCREEN: Antibody Screen: NEGATIVE

## 2020-04-14 LAB — OB RESULTS CONSOLE ABO/RH: RH Type: POSITIVE

## 2020-05-25 LAB — OB RESULTS CONSOLE HEPATITIS B SURFACE ANTIGEN: Hepatitis B Surface Ag: NEGATIVE

## 2020-05-25 LAB — OB RESULTS CONSOLE RPR: RPR: NONREACTIVE

## 2020-05-25 LAB — OB RESULTS CONSOLE HIV ANTIBODY (ROUTINE TESTING): HIV: NONREACTIVE

## 2020-05-25 LAB — OB RESULTS CONSOLE RUBELLA ANTIBODY, IGM: Rubella: IMMUNE

## 2020-07-23 HISTORY — DX: Maternal care for unspecified type scar from previous cesarean delivery: O34.219

## 2020-07-28 ENCOUNTER — Other Ambulatory Visit: Payer: Self-pay | Admitting: Primary Care

## 2020-07-28 DIAGNOSIS — F411 Generalized anxiety disorder: Secondary | ICD-10-CM

## 2020-11-07 ENCOUNTER — Other Ambulatory Visit: Payer: Self-pay | Admitting: Obstetrics and Gynecology

## 2020-11-16 ENCOUNTER — Encounter (HOSPITAL_COMMUNITY): Payer: Self-pay | Admitting: *Deleted

## 2020-11-16 ENCOUNTER — Encounter (HOSPITAL_COMMUNITY): Payer: Self-pay

## 2020-11-16 NOTE — Patient Instructions (Addendum)
LANEICE MENEELY  11/16/2020   Your procedure is scheduled on:  11/30/2020  Arrive at [place] at [facility] on [address] at [facility]  and [address]. You are invited to use the FREE valet parking or use the Visitor's parking deck.  Pick up the phone at the desk and dial [phone].  Call this number if you have problems the morning of surgery: [phone]  Remember:   Do not eat food:(After Midnight) Desps de medianoche.  Do not drink clear liquids: (After Midnight) Desps de medianoche.  Take these medicines the morning of surgery with A SIP OF WATER:  NONE   Do not wear jewelry, make-up or nail polish.  Do not wear lotions, powders, or perfumes. Do not wear deodorant.  Do not shave 48 hours prior to surgery.  Do not bring valuables to the hospital.  [facility] is not   responsible for any belongings or valuables brought to the hospital.  Contacts, dentures or bridgework may not be worn into surgery.  Leave suitcase in the car. After surgery it may be brought to your room.  For patients admitted to the hospital, checkout time is 11:00 AM the day of              discharge.      Please read over the following fact sheets that you were given:     Preparing for Surgery

## 2020-11-28 ENCOUNTER — Other Ambulatory Visit (HOSPITAL_COMMUNITY): Payer: 59

## 2020-11-28 ENCOUNTER — Other Ambulatory Visit: Payer: Self-pay

## 2020-11-28 ENCOUNTER — Other Ambulatory Visit
Admission: RE | Admit: 2020-11-28 | Discharge: 2020-11-28 | Disposition: A | Discharge status: Home / Self Care | Payer: 59 | Source: Ambulatory Visit | Attending: Obstetrics and Gynecology | Admitting: Obstetrics and Gynecology

## 2020-11-28 ENCOUNTER — Encounter (HOSPITAL_COMMUNITY)
Admission: RE | Admit: 2020-11-28 | Discharge: 2020-11-28 | Disposition: A | Discharge status: Home / Self Care | Payer: 59 | Source: Ambulatory Visit | Attending: Obstetrics and Gynecology | Admitting: Obstetrics and Gynecology

## 2020-11-28 DIAGNOSIS — Z20822 Contact with and (suspected) exposure to covid-19: Secondary | ICD-10-CM | POA: Diagnosis not present

## 2020-11-28 DIAGNOSIS — Z01812 Encounter for preprocedural laboratory examination: Secondary | ICD-10-CM | POA: Insufficient documentation

## 2020-11-28 LAB — CBC
HCT: 34.4 % — ABNORMAL LOW (ref 36.0–46.0)
Hemoglobin: 11 g/dL — ABNORMAL LOW (ref 12.0–15.0)
MCH: 28.8 pg (ref 26.0–34.0)
MCHC: 32 g/dL (ref 30.0–36.0)
MCV: 90.1 fL (ref 80.0–100.0)
Platelets: 200 10*3/uL (ref 150–400)
RBC: 3.82 MIL/uL — ABNORMAL LOW (ref 3.87–5.11)
RDW: 14.6 % (ref 11.5–15.5)
WBC: 7 10*3/uL (ref 4.0–10.5)
nRBC: 0 % (ref 0.0–0.2)

## 2020-11-28 LAB — SARS CORONAVIRUS 2 (TAT 6-24 HRS): SARS Coronavirus 2: NEGATIVE

## 2020-11-29 LAB — TYPE AND SCREEN
ABO/RH(D): O POS
Antibody Screen: NEGATIVE

## 2020-11-29 LAB — RPR: RPR Ser Ql: NONREACTIVE

## 2020-11-29 NOTE — Anesthesia Preprocedure Evaluation (Addendum)
Anesthesia Evaluation  Patient identified by MRN, date of birth, ID band Patient awake    Reviewed: Allergy & Precautions, NPO status , Patient's Chart, lab work & pertinent test results  History of Anesthesia Complications (+) PONV  Airway Mallampati: II  TM Distance: >3 FB Neck ROM: Full    Dental no notable dental hx. (+) Teeth Intact, Dental Advisory Given   Pulmonary neg pulmonary ROS,    Pulmonary exam normal breath sounds clear to auscultation       Cardiovascular Exercise Tolerance: Good Normal cardiovascular exam Rhythm:Regular Rate:Normal     Neuro/Psych  Headaches, Anxiety    GI/Hepatic negative GI ROS, Neg liver ROS, GERD  ,  Endo/Other  negative endocrine ROS  Renal/GU negative Renal ROS     Musculoskeletal   Abdominal   Peds  Hematology Lab Results      Component                Value               Date                      WBC                      7.0                 11/28/2020                HGB                      11.0 (L)            11/28/2020                HCT                      34.4 (L)            11/28/2020                MCV                      90.1                11/28/2020                PLT                      200                 11/28/2020              Anesthesia Other Findings   Reproductive/Obstetrics (+) Pregnancy                            Anesthesia Physical Anesthesia Plan  ASA: II  Anesthesia Plan: Spinal   Post-op Pain Management:    Induction:   PONV Risk Score and Plan: 4 or greater and Treatment may vary due to age or medical condition, Dexamethasone, Ondansetron and Scopolamine patch - Pre-op  Airway Management Planned: Natural Airway and Nasal Cannula  Additional Equipment: None  Intra-op Plan:   Post-operative Plan:   Informed Consent: I have reviewed the patients History and Physical, chart, labs and discussed the procedure  including the risks, benefits and alternatives for the proposed anesthesia with the patient  or authorized representative who has indicated his/her understanding and acceptance.     Dental advisory given  Plan Discussed with: CRNA  Anesthesia Plan Comments: [phone] G5P1 for Rpt c/s)       Anesthesia Quick Evaluation

## 2020-11-29 NOTE — H&P (Signed)
Casey Clarke is a 40 y.o. female presenting for rpt csection for history of myomectomy with classical uterine incision. OB History    Gravida  5   Para  1   Term  1   Preterm  0   AB  3   Living  1     SAB  3   IAB  0   Ectopic  0   Multiple  0   Live Births  1          Past Medical History:  Diagnosis Date  . Abnormal Pap smear of cervix 2014, 03/2014   ASCUS, pos HR HPV, 18/45; 2015 LGSIL, + HR HPV  . AMA (advanced maternal age) primigravida 11+   . Dermoid cyst of ovary, left 03/2016  . Fibroid   . GERD (gastroesophageal reflux disease)   . H/O myomectomy 06/16/2018  . Heart murmur   . Heterozygous MTHFR mutation [id number]   . History of gastric ulcer    remote hx - resolved  . History of kidney stones   . History of palpitations    hypersenitive to codeine and per pt sometimes tachy  . History of recurrent miscarriages   . 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 Infertility, female   . Migraine   . Nocturia   . PONV (postoperative nausea and vomiting)    itching from fentanyl  . Pregnancy with history of uterine myomectomy 06/16/2018  . S/P cesarean section 06/16/2018   Past Surgical History:  Procedure Laterality Date  . AUGMENTATION MAMMAPLASTY Bilateral 01/17/10   implants  . CESAREAN SECTION N/A 06/16/2018   Procedure: Primary CESAREAN SECTION;  Surgeon: Brien Few, MD;  Location: [place];  Service: Obstetrics;  Laterality: N/A;  EDD: 07/07/18 Allergy: Codeine  . CHROMOPERTUBATION N/A 06/19/2016   Procedure: CHROMOPERTUBATION;  Surgeon: Governor Specking, MD;  Location: [facility];  Service: Gynecology;  Laterality: N/A;  . COLPOSCOPY  2014, 2015   2015 LGSIL, neg ECC  . DILATION AND EVACUATION  12/30/2007   missed ab  . LAPAROSCOPIC GELPORT ASSISTED MYOMECTOMY N/A 06/19/2016   Procedure: LAPAROSCOPIC GELPORT ASSISTED MYOMECTOMY;  Surgeon: Governor Specking, MD;  Location: [place];  Service: Gynecology;   Laterality: N/A;  . LAPAROSCOPY     MYOMECTOMY  . OVARIAN CYST REMOVAL Left 06/19/2016   Procedure: LEFT OVARIAN CYSTECTOMY;  Surgeon: Governor Specking, MD;  Location: [place];  Service: Gynecology;  Laterality: Left;  . TRANSTHORACIC ECHOCARDIOGRAM  01/08/2014   ef 56%/  mild to moderater TR/  trivial PR   Family History: family history includes Cancer in her maternal grandfather and paternal grandmother; Diabetes in her maternal aunt; Heart attack in her maternal grandfather; Heart disease in her paternal grandfather; Hypertension in her maternal grandmother and mother; Hypotension in her paternal grandfather; Kidney disease in her maternal grandmother; Thyroid disease in her maternal grandmother and mother. Social History:  reports that she has never smoked. She has never used smokeless tobacco. She reports current alcohol use of about 1.0 - 2.0 standard drink of alcohol per week. She reports that she does not use drugs.     Maternal Diabetes: No Genetic Screening: Normal Maternal Ultrasounds/Referrals: Normal Fetal Ultrasounds or other Referrals:  None Maternal Substance Abuse:  No Significant Maternal Medications:  None Significant Maternal Lab Results:  Group B Strep negative Other Comments:  None  Review of Systems  Constitutional: Negative.   All other systems reviewed and are negative.  Maternal Medical History:  Reason for admission: Contractions.   Contractions: Onset was 1 week or more ago.   Frequency: rare.   Perceived severity is mild.    Fetal activity: Perceived fetal activity is normal.   Last perceived fetal movement was within the past hour.    Prenatal complications: no prenatal complications Prenatal Complications - Diabetes: none.      Last menstrual period 02/22/2020. Maternal Exam:  Uterine Assessment: Contraction strength is mild.  Contraction frequency is rare.   Abdomen: Patient reports no abdominal tenderness. Fetal  presentation: vertex  Introitus: Normal vulva. Normal vagina.  Ferning test: not done.  Nitrazine test: not done. Amniotic fluid character: not assessed.  Pelvis: questionable for delivery.   Cervix: Cervix evaluated by digital exam.     Physical Exam Vitals and nursing note reviewed. Exam conducted with a chaperone present.  Constitutional:      Appearance: Normal appearance. She is normal weight.  HENT:     Head: Normocephalic and atraumatic.  Cardiovascular:     Rate and Rhythm: Normal rate and regular rhythm.     Pulses: Normal pulses.     Heart sounds: Normal heart sounds.  Pulmonary:     Effort: Pulmonary effort is normal.     Breath sounds: Normal breath sounds.  Abdominal:     General: Abdomen is flat.     Palpations: Abdomen is soft.  Genitourinary:    General: Normal vulva.  Musculoskeletal:        General: Normal range of motion.     Cervical back: Normal range of motion and neck supple.  Skin:    General: Skin is warm and dry.  Neurological:     General: No focal deficit present.     Mental Status: She is alert and oriented to person, place, and time. Mental status is at baseline.  Psychiatric:        Mood and Affect: Mood normal.        Behavior: Behavior normal.     Prenatal labs: ABO, Rh: --/--/O POS (05/09 0915) Antibody: NEG (05/09 0915) Rubella: Immune (11/03 0000) RPR: NON REACTIVE (05/09 0907)  HBsAg: Negative (11/03 0000)  HIV: Non-reactive (11/03 0000)  GBS:     Assessment/Plan: 37 week IUP Previous myomectomy History of PTL Previous csection For rpt csection. Consent done.  Requests TL. Surgical and failure risks discussed.   Casey Clarke 11/29/2020, 7:42 PM

## 2020-11-30 ENCOUNTER — Encounter (HOSPITAL_COMMUNITY): Payer: Self-pay | Admitting: Obstetrics and Gynecology

## 2020-11-30 ENCOUNTER — Encounter (HOSPITAL_COMMUNITY)
Admission: RE | Disposition: A | Discharge status: Home / Self Care | Payer: Self-pay | Source: Home / Self Care | Attending: Obstetrics and Gynecology

## 2020-11-30 ENCOUNTER — Other Ambulatory Visit: Payer: Self-pay

## 2020-11-30 ENCOUNTER — Inpatient Hospital Stay (HOSPITAL_COMMUNITY)
Admission: RE | Admit: 2020-11-30 | Discharge: 2020-12-02 | DRG: 787 | Disposition: A | Discharge status: Home / Self Care | Payer: 59 | Attending: Obstetrics and Gynecology | Admitting: Obstetrics and Gynecology

## 2020-11-30 ENCOUNTER — Inpatient Hospital Stay (HOSPITAL_COMMUNITY): Payer: 59 | Admitting: Anesthesiology

## 2020-11-30 DIAGNOSIS — Z9889 Other specified postprocedural states: Secondary | ICD-10-CM

## 2020-11-30 DIAGNOSIS — O34219 Maternal care for unspecified type scar from previous cesarean delivery: Secondary | ICD-10-CM

## 2020-11-30 DIAGNOSIS — D62 Acute posthemorrhagic anemia: Secondary | ICD-10-CM | POA: Diagnosis not present

## 2020-11-30 DIAGNOSIS — F411 Generalized anxiety disorder: Secondary | ICD-10-CM | POA: Diagnosis present

## 2020-11-30 DIAGNOSIS — O99344 Other mental disorders complicating childbirth: Secondary | ICD-10-CM | POA: Diagnosis present

## 2020-11-30 DIAGNOSIS — O9081 Anemia of the puerperium: Secondary | ICD-10-CM | POA: Diagnosis not present

## 2020-11-30 DIAGNOSIS — Z3A37 37 weeks gestation of pregnancy: Secondary | ICD-10-CM | POA: Diagnosis not present

## 2020-11-30 DIAGNOSIS — O34212 Maternal care for vertical scar from previous cesarean delivery: Principal | ICD-10-CM | POA: Diagnosis present

## 2020-11-30 DIAGNOSIS — O9902 Anemia complicating childbirth: Secondary | ICD-10-CM | POA: Diagnosis not present

## 2020-11-30 HISTORY — DX: Other specified postprocedural states: Z98.890

## 2020-11-30 SURGERY — Surgical Case
Anesthesia: Spinal | Wound class: Clean Contaminated

## 2020-11-30 MED ORDER — ONDANSETRON HCL 4 MG/2ML IJ SOLN: 4.0000 mg | Freq: Once | INTRAMUSCULAR | Status: DC | PRN

## 2020-11-30 MED ORDER — KETOROLAC TROMETHAMINE 30 MG/ML IJ SOLN
30.0000 mg | Freq: Four times a day (QID) | INTRAMUSCULAR | Status: AC
  Administered 2020-11-30 – 2020-12-01 (×3): 30 mg via INTRAVENOUS
  Filled 2020-11-30 (×3): qty 1

## 2020-11-30 MED ORDER — PRENATAL MULTIVITAMIN CH
1.0000 | ORAL_TABLET | Freq: Every day | ORAL | Status: DC
  Filled 2020-11-30 (×2): qty 1

## 2020-11-30 MED ORDER — SIMETHICONE 80 MG PO CHEW
80.0000 mg | CHEWABLE_TABLET | Freq: Three times a day (TID) | ORAL | Status: DC
  Administered 2020-11-30 – 2020-12-02 (×6): 80 mg via ORAL
  Filled 2020-11-30 (×7): qty 1

## 2020-11-30 MED ORDER — MENTHOL 3 MG MT LOZG: 1.0000 | LOZENGE | OROMUCOSAL | Status: DC | PRN

## 2020-11-30 MED ORDER — KETOROLAC TROMETHAMINE 30 MG/ML IJ SOLN
INTRAMUSCULAR | Status: AC
  Filled 2020-11-30: qty 1

## 2020-11-30 MED ORDER — NALOXONE HCL 0.4 MG/ML IJ SOLN: 0.4000 mg | INTRAMUSCULAR | Status: DC | PRN

## 2020-11-30 MED ORDER — KETOROLAC TROMETHAMINE 30 MG/ML IJ SOLN
30.0000 mg | Freq: Once | INTRAMUSCULAR | Status: AC | PRN
  Administered 2020-11-30: 30 mg via INTRAVENOUS

## 2020-11-30 MED ORDER — OXYTOCIN-SODIUM CHLORIDE 30-0.9 UT/500ML-% IV SOLN
2.5000 [IU]/h | INTRAVENOUS | Status: AC
  Administered 2020-11-30: 2.5 [IU]/h via INTRAVENOUS
  Filled 2020-11-30: qty 500

## 2020-11-30 MED ORDER — SENNOSIDES-DOCUSATE SODIUM 8.6-50 MG PO TABS
2.0000 | ORAL_TABLET | Freq: Every day | ORAL | Status: DC
  Administered 2020-12-01 – 2020-12-02 (×2): 2 via ORAL
  Filled 2020-11-30 (×2): qty 2

## 2020-11-30 MED ORDER — ONDANSETRON HCL 4 MG/2ML IJ SOLN
INTRAMUSCULAR | Status: AC
  Filled 2020-11-30: qty 4

## 2020-11-30 MED ORDER — DEXAMETHASONE SODIUM PHOSPHATE 10 MG/ML IJ SOLN
INTRAMUSCULAR | Status: DC | PRN
  Administered 2020-11-30: 10 mg via INTRAVENOUS

## 2020-11-30 MED ORDER — DIPHENHYDRAMINE HCL 25 MG PO CAPS: 25.0000 mg | ORAL_CAPSULE | Freq: Four times a day (QID) | ORAL | Status: DC | PRN

## 2020-11-30 MED ORDER — BUPIVACAINE HCL (PF) 0.25 % IJ SOLN
INTRAMUSCULAR | Status: DC | PRN
  Administered 2020-11-30: 20 mL

## 2020-11-30 MED ORDER — SCOPOLAMINE 1 MG/3DAYS TD PT72
MEDICATED_PATCH | TRANSDERMAL | Status: AC
  Filled 2020-11-30: qty 1

## 2020-11-30 MED ORDER — OXYCODONE HCL 5 MG PO TABS: 5.0000 mg | ORAL_TABLET | Freq: Once | ORAL | Status: DC | PRN

## 2020-11-30 MED ORDER — PHENYLEPHRINE HCL-NACL 20-0.9 MG/250ML-% IV SOLN
INTRAVENOUS | Status: AC
  Filled 2020-11-30: qty 250

## 2020-11-30 MED ORDER — DIBUCAINE (PERIANAL) 1 % EX OINT: 1.0000 "application " | TOPICAL_OINTMENT | CUTANEOUS | Status: DC | PRN

## 2020-11-30 MED ORDER — ONDANSETRON HCL 4 MG/2ML IJ SOLN: 4.0000 mg | Freq: Three times a day (TID) | INTRAMUSCULAR | Status: DC | PRN

## 2020-11-30 MED ORDER — LACTATED RINGERS IV SOLN: INTRAVENOUS | Status: DC | PRN

## 2020-11-30 MED ORDER — OXYTOCIN-SODIUM CHLORIDE 30-0.9 UT/500ML-% IV SOLN
INTRAVENOUS | Status: DC | PRN
  Administered 2020-11-30: 30 [IU] via INTRAVENOUS

## 2020-11-30 MED ORDER — NALOXONE HCL 4 MG/10ML IJ SOLN
1.0000 ug/kg/h | INTRAVENOUS | Status: DC | PRN
  Filled 2020-11-30: qty 5

## 2020-11-30 MED ORDER — TETANUS-DIPHTH-ACELL PERTUSSIS 5-2.5-18.5 LF-MCG/0.5 IM SUSY: 0.5000 mL | PREFILLED_SYRINGE | Freq: Once | INTRAMUSCULAR | Status: DC

## 2020-11-30 MED ORDER — NALBUPHINE HCL 10 MG/ML IJ SOLN: 5.0000 mg | Freq: Once | INTRAMUSCULAR | Status: DC | PRN

## 2020-11-30 MED ORDER — OXYCODONE-ACETAMINOPHEN 5-325 MG PO TABS: 1.0000 | ORAL_TABLET | ORAL | Status: DC | PRN

## 2020-11-30 MED ORDER — HYDROMORPHONE HCL 1 MG/ML IJ SOLN: 0.2500 mg | INTRAMUSCULAR | Status: DC | PRN

## 2020-11-30 MED ORDER — CEFAZOLIN SODIUM-DEXTROSE 2-4 GM/100ML-% IV SOLN
2.0000 g | INTRAVENOUS | Status: AC
  Administered 2020-11-30: 2 g via INTRAVENOUS

## 2020-11-30 MED ORDER — OXYTOCIN-SODIUM CHLORIDE 30-0.9 UT/500ML-% IV SOLN
INTRAVENOUS | Status: AC
  Filled 2020-11-30: qty 500

## 2020-11-30 MED ORDER — NALBUPHINE HCL 10 MG/ML IJ SOLN: 5.0000 mg | INTRAMUSCULAR | Status: DC | PRN

## 2020-11-30 MED ORDER — MORPHINE SULFATE (PF) 0.5 MG/ML IJ SOLN
INTRAMUSCULAR | Status: AC
  Filled 2020-11-30: qty 10

## 2020-11-30 MED ORDER — ESCITALOPRAM OXALATE 10 MG PO TABS
10.0000 mg | ORAL_TABLET | Freq: Every day | ORAL | Status: DC
  Administered 2020-11-30 – 2020-12-01 (×2): 10 mg via ORAL
  Filled 2020-11-30 (×2): qty 1

## 2020-11-30 MED ORDER — ACETAMINOPHEN 500 MG PO TABS
1000.0000 mg | ORAL_TABLET | Freq: Four times a day (QID) | ORAL | Status: DC
  Administered 2020-11-30 – 2020-12-02 (×9): 1000 mg via ORAL
  Filled 2020-11-30 (×10): qty 2

## 2020-11-30 MED ORDER — SODIUM CHLORIDE 0.9 % IR SOLN
Status: DC | PRN
  Administered 2020-11-30: 1000 mL

## 2020-11-30 MED ORDER — METHYLERGONOVINE MALEATE 0.2 MG PO TABS: 0.2000 mg | ORAL_TABLET | ORAL | Status: DC | PRN

## 2020-11-30 MED ORDER — PHENYLEPHRINE HCL-NACL 20-0.9 MG/250ML-% IV SOLN
INTRAVENOUS | Status: DC | PRN
  Administered 2020-11-30: 60 ug/min via INTRAVENOUS

## 2020-11-30 MED ORDER — OXYCODONE HCL 5 MG/5ML PO SOLN: 5.0000 mg | Freq: Once | ORAL | Status: DC | PRN

## 2020-11-30 MED ORDER — POVIDONE-IODINE 10 % EX SWAB
2.0000 "application " | Freq: Once | CUTANEOUS | Status: AC
  Administered 2020-11-30: 2 via TOPICAL

## 2020-11-30 MED ORDER — SIMETHICONE 80 MG PO CHEW
80.0000 mg | CHEWABLE_TABLET | ORAL | Status: DC | PRN
  Administered 2020-12-02 (×2): 80 mg via ORAL
  Filled 2020-11-30: qty 1

## 2020-11-30 MED ORDER — FENTANYL CITRATE (PF) 100 MCG/2ML IJ SOLN
INTRAMUSCULAR | Status: AC
  Filled 2020-11-30: qty 2

## 2020-11-30 MED ORDER — DIPHENHYDRAMINE HCL 50 MG/ML IJ SOLN: 12.5000 mg | INTRAMUSCULAR | Status: DC | PRN

## 2020-11-30 MED ORDER — DIPHENHYDRAMINE HCL 25 MG PO CAPS: 25.0000 mg | ORAL_CAPSULE | ORAL | Status: DC | PRN

## 2020-11-30 MED ORDER — METHYLERGONOVINE MALEATE 0.2 MG/ML IJ SOLN: 0.2000 mg | INTRAMUSCULAR | Status: DC | PRN

## 2020-11-30 MED ORDER — SCOPOLAMINE 1 MG/3DAYS TD PT72
1.0000 | MEDICATED_PATCH | Freq: Once | TRANSDERMAL | Status: DC
  Administered 2020-11-30: 1.5 mg via TRANSDERMAL

## 2020-11-30 MED ORDER — ONDANSETRON HCL 4 MG/2ML IJ SOLN
INTRAMUSCULAR | Status: DC | PRN
  Administered 2020-11-30: 8 mg via INTRAVENOUS

## 2020-11-30 MED ORDER — BUPIVACAINE HCL (PF) 0.25 % IJ SOLN
INTRAMUSCULAR | Status: AC
  Filled 2020-11-30: qty 30

## 2020-11-30 MED ORDER — SODIUM CHLORIDE 0.9% FLUSH: 3.0000 mL | INTRAVENOUS | Status: DC | PRN

## 2020-11-30 MED ORDER — LACTATED RINGERS IV SOLN: INTRAVENOUS | Status: AC

## 2020-11-30 MED ORDER — STERILE WATER FOR IRRIGATION IR SOLN
Status: DC | PRN
  Administered 2020-11-30: 1000 mL

## 2020-11-30 MED ORDER — CEFAZOLIN SODIUM-DEXTROSE 2-4 GM/100ML-% IV SOLN
INTRAVENOUS | Status: AC
  Filled 2020-11-30: qty 100

## 2020-11-30 MED ORDER — LACTATED RINGERS IV SOLN: INTRAVENOUS | Status: DC

## 2020-11-30 MED ORDER — DEXAMETHASONE SODIUM PHOSPHATE 10 MG/ML IJ SOLN
INTRAMUSCULAR | Status: AC
  Filled 2020-11-30: qty 1

## 2020-11-30 MED ORDER — WITCH HAZEL-GLYCERIN EX PADS: 1.0000 "application " | MEDICATED_PAD | CUTANEOUS | Status: DC | PRN

## 2020-11-30 MED ORDER — ZOLPIDEM TARTRATE 5 MG PO TABS: 5.0000 mg | ORAL_TABLET | Freq: Every evening | ORAL | Status: DC | PRN

## 2020-11-30 MED ORDER — IBUPROFEN 600 MG PO TABS
600.0000 mg | ORAL_TABLET | Freq: Four times a day (QID) | ORAL | Status: DC
  Administered 2020-12-01 – 2020-12-02 (×5): 600 mg via ORAL
  Filled 2020-11-30 (×5): qty 1

## 2020-11-30 MED ORDER — COCONUT OIL OIL: 1.0000 "application " | TOPICAL_OIL | Status: DC | PRN

## 2020-11-30 SURGICAL SUPPLY — 39 items
APL SKNCLS STERI-STRIP NONHPOA (GAUZE/BANDAGES/DRESSINGS) ×1
BENZOIN TINCTURE PRP APPL 2/3 (GAUZE/BANDAGES/DRESSINGS) ×1 IMPLANT
CHLORAPREP W/TINT 26ML (MISCELLANEOUS) ×2 IMPLANT
CLAMP CORD UMBIL (MISCELLANEOUS) IMPLANT
CLOTH BEACON ORANGE TIMEOUT ST (SAFETY) ×2 IMPLANT
DECANTER SPIKE VIAL GLASS SM (MISCELLANEOUS) ×2 IMPLANT
DRSG OPSITE POSTOP 4X10 (GAUZE/BANDAGES/DRESSINGS) ×2 IMPLANT
ELECT REM PT RETURN 9FT ADLT (ELECTROSURGICAL) ×2
ELECTRODE REM PT RTRN 9FT ADLT (ELECTROSURGICAL) ×1 IMPLANT
EXTRACTOR VACUUM M CUP 4 TUBE (SUCTIONS) IMPLANT
GLOVE BIO SURGEON STRL SZ7.5 (GLOVE) ×2 IMPLANT
GLOVE BIOGEL PI IND STRL 7.0 (GLOVE) ×1 IMPLANT
GLOVE BIOGEL PI INDICATOR 7.0 (GLOVE) ×1
GOWN STRL REUS W/TWL LRG LVL3 (GOWN DISPOSABLE) ×4 IMPLANT
KIT ABG SYR 3ML LUER SLIP (SYRINGE) IMPLANT
NDL HYPO 25X5/8 SAFETYGLIDE (NEEDLE) IMPLANT
NDL SPNL 20GX3.5 QUINCKE YW (NEEDLE) IMPLANT
NEEDLE HYPO 22GX1.5 SAFETY (NEEDLE) ×2 IMPLANT
NEEDLE HYPO 25X5/8 SAFETYGLIDE (NEEDLE) IMPLANT
NEEDLE SPNL 20GX3.5 QUINCKE YW (NEEDLE) IMPLANT
NS IRRIG 1000ML POUR BTL (IV SOLUTION) ×2 IMPLANT
PACK C SECTION WH (CUSTOM PROCEDURE TRAY) ×2 IMPLANT
PENCIL SMOKE EVAC W/HOLSTER (ELECTROSURGICAL) ×2 IMPLANT
SPONGE LAP 18X18 RF (DISPOSABLE) ×1 IMPLANT
STRIP CLOSURE SKIN 1/2X4 (GAUZE/BANDAGES/DRESSINGS) ×1 IMPLANT
SUT MNCRL 0 VIOLET CTX 36 (SUTURE) ×2 IMPLANT
SUT MNCRL AB 3-0 PS2 27 (SUTURE) IMPLANT
SUT MON AB 2-0 CT1 27 (SUTURE) ×2 IMPLANT
SUT MON AB-0 CT1 36 (SUTURE) ×4 IMPLANT
SUT MONOCRYL 0 CTX 36 (SUTURE) ×4
SUT PLAIN 0 NONE (SUTURE) ×1 IMPLANT
SUT PLAIN 2 0 (SUTURE)
SUT PLAIN 2 0 XLH (SUTURE) IMPLANT
SUT PLAIN ABS 2-0 CT1 27XMFL (SUTURE) IMPLANT
SYR 20CC LL (SYRINGE) IMPLANT
SYR CONTROL 10ML LL (SYRINGE) ×2 IMPLANT
TOWEL OR 17X24 6PK STRL BLUE (TOWEL DISPOSABLE) ×2 IMPLANT
TRAY FOLEY W/BAG SLVR 14FR LF (SET/KITS/TRAYS/PACK) ×2 IMPLANT
WATER STERILE IRR 1000ML POUR (IV SOLUTION) ×2 IMPLANT

## 2020-11-30 NOTE — Anesthesia Postprocedure Evaluation (Signed)
Anesthesia Post Note  Patient: Casey Clarke  Procedure(s) Performed: Repeat CESAREAN SECTION (N/A )     Patient location during evaluation: Mother Baby Anesthesia Type: Spinal Level of consciousness: oriented and awake and alert Pain management: pain level controlled Vital Signs Assessment: post-procedure vital signs reviewed and stable Respiratory status: spontaneous breathing and respiratory function stable Cardiovascular status: blood pressure returned to baseline and stable Postop Assessment: no headache, no backache, no apparent nausea or vomiting and able to ambulate Anesthetic complications: no   No complications documented.  Last Vitals:  Vitals:   11/30/20 1037 11/30/20 1140  BP: 111/74 99/67  Pulse: 82 69  Resp: 16 17  Temp: 36.6 C 36.7 C  SpO2: 100%     Last Pain:  Vitals:   11/30/20 1140  TempSrc: Oral  PainSc:    Pain Goal:                   Barnet Glasgow

## 2020-11-30 NOTE — Lactation Note (Addendum)
This note was copied from a baby's chart. Lactation Consultation Note  Patient Name: Casey Clarke Today's Date: 11/30/2020 Reason for consult: Initial assessment;Early term 37-38.6wks Age:39 hours   P2 mother whose infant is now 36 hours old.  This is an ETI at 37+1 weeks.  Mother attempted to breast feed her first child (now 63-1/2 years old) but never had a full milk supply.  She was only able to obtain approximately 1 ounce per pumping session after many weeks.  Mother has had breast augmentation.  Mother's desire is to pump and bottle feed only.  She will be using Enfamil 20 calorie formula per MD.  Reviewed the LPTI policy guidelines with mother.  Discussed and reviewed pumping and frequency.  Mother has a "hands free" bra here which RN will assist with after pumping.  Encouraged mother to practice breast massage and hand expression before/after pumping and to massage breasts during pumping to help with milk supply.  Mother aware that she may not get a full milk supply and she is comfortable with this.   Praised mother for being determined enough to begin pumping early and consistently for stimulation.  Grandmother bottle fed baby during my visit and baby consumed 15 mls in 10 minutes.  Burped well.  Father present.  Mother has a DEBP for home use and has it at bedside.  Asked her to use the hospital grade pump while she is here.  Mother verbalized understanding.  RN updated.   Maternal Data Has patient been taught Hand Expression?: Yes Does the patient have breastfeeding experience prior to this delivery?: No (Attempted but very little milk supply)  Feeding Mother's Current Feeding Choice: Breast Milk and Formula Nipple Type: Extra Slow Flow  LATCH Score                    Lactation Tools Discussed/Used Tools: Pump;Flanges;Coconut oil Flange Size: 24;27 Breast pump type: Double-Electric Breast Pump;Manual Pump Education: Setup, frequency, and cleaning;Milk  Storage Reason for Pumping: Breast stimulation: history of low milk supply with first child Pumping frequency: Every three hours  Interventions    Discharge Pump: DEBP;Manual;Personal  Consult Status Consult Status: Follow-up Date: 12/01/20 Follow-up type: In-patient    Little Ishikawa 11/30/2020, 5:18 PM

## 2020-11-30 NOTE — Transfer of Care (Signed)
Immediate Anesthesia Transfer of Care Note  Patient: Casey Clarke  Procedure(s) Performed: Repeat CESAREAN SECTION (N/A )  Patient Location: PACU  Anesthesia Type:Spinal  Level of Consciousness: awake, alert  and oriented  Airway & Oxygen Therapy: Patient Spontanous Breathing  Post-op Assessment: Report given to RN and Post -op Vital signs reviewed and stable  Post vital signs: Reviewed and stable  Last Vitals:  Vitals Value Taken Time  BP 109/68 11/30/20 0945  Temp 36.6 C 11/30/20 0935  Pulse 86 11/30/20 0949  Resp 17 11/30/20 0949  SpO2 100 % 11/30/20 0949  Vitals shown include unvalidated device data.  Last Pain:  Vitals:   11/30/20 0945  TempSrc:   PainSc: 0-No pain         Complications: No complications documented.

## 2020-11-30 NOTE — Anesthesia Procedure Notes (Signed)
Spinal  Patient location during procedure: OB Start time: 11/30/2020 8:22 AM End time: 11/30/2020 8:26 AM Reason for block: surgical anesthesia Staffing Performed: anesthesiologist  Anesthesiologist: Barnet Glasgow, MD Preanesthetic Checklist Completed: patient identified, IV checked, risks and benefits discussed, surgical consent, monitors and equipment checked, pre-op evaluation and timeout performed Spinal Block Patient position: sitting Prep: DuraPrep and site prepped and draped Patient monitoring: heart rate, cardiac monitor, continuous pulse ox and blood pressure Approach: midline Location: L3-4 Injection technique: single-shot Needle Needle type: Pencan  Needle gauge: 24 G Needle length: 10 cm Needle insertion depth: 5 cm Assessment Sensory level: T4 Events: CSF return Additional Notes 1 Attempt (s). Pt tolerated procedure well.

## 2020-11-30 NOTE — Op Note (Signed)
Cesarean Section Procedure Note  Indications: previous myomectomy and previous uterine incision kerr x one  Pre-operative Diagnosis: 37 week 1 day pregnancy.  Post-operative Diagnosis: same  Surgeon: Lovenia Kim   Assistants: Eddie Dibbles, CNM  Anesthesia: Local anesthesia 0.25.% bupivacaine and Spinal anesthesia  ASA Class: 2  Procedure Details  The patient was seen in the Holding Room. The risks, benefits, complications, treatment options, and expected outcomes were discussed with the patient.  The patient concurred with the proposed plan, giving informed consent. The risks of anesthesia, infection, bleeding and possible injury to other organs discussed. Injury to bowel, bladder, or ureter with possible need for repair discussed. Possible need for transfusion with secondary risks of hepatitis or HIV acquisition discussed. Post operative complications to include but not limited to DVT, PE and Pneumonia noted. The site of surgery properly noted/marked. The patient was taken to Operating Room # C, identified as Casey Clarke and the procedure verified as C-Section Delivery. A Time Out was held and the above information confirmed.  After induction of anesthesia, the patient was draped and prepped in the usual sterile manner. A Pfannenstiel incision was made and carried down through the subcutaneous tissue to the fascia. Fascial incision was made and extended transversely using Mayo scissors. The fascia was separated from the underlying rectus tissue superiorly and inferiorly. The peritoneum was identified and entered. Peritoneal incision was extended longitudinally. The utero-vesical peritoneal reflection was incised transversely and the bladder flap was bluntly freed from the lower uterine segment Dense adhesions noted. No evidence of bladder injury after sharp and blunt dissection. Urine clear.  A low transverse uterine incision(Kerr hysterotomy) was made. Delivered from OA presentation was a   female with Apgar scores of 8 at one minute and 9 at five minutes. Bulb suctioning gently performed. Neonatal team in attendance.After the umbilical cord was clamped and cut cord blood was obtained for evaluation. The placenta was removed intact and appeared normal. The uterus was curetted with a dry lap pack. Good hemostasis was noted.The uterine outline, tubes and ovaries appeared normal. The uterine incision was closed with running locked sutures of 0 Monocryl x 2 layers.Bilateral partial modified Pomeroy salpingectomy performed without complications. Hemostasis was observed. Lavage was carried out until clear.The parietal peritoneum was closed with a running 2-0 Monocryl suture. The fascia was then reapproximated with running sutures of 0 Monocryl. The skin was reapproximated with 4-0 vicryl after Norco closure with 2-0 plain.  Instrument, sponge, and needle counts were correct prior the abdominal closure and at the conclusion of the case.   Findings: FTLM , OA, anterior placenta, nl tubes  Estimated Blood Loss:  600         Drains: foley                 Specimens: placenta and bilateral tubal segments                 Complications:  None; patient tolerated the procedure well.         Disposition: PACU - hemodynamically stable.         Condition: stable  Attending Attestation: I performed the procedure.

## 2020-12-01 DIAGNOSIS — O9902 Anemia complicating childbirth: Secondary | ICD-10-CM

## 2020-12-01 HISTORY — DX: Anemia complicating childbirth: O99.02

## 2020-12-01 LAB — CBC
HCT: 26.4 % — ABNORMAL LOW (ref 36.0–46.0)
Hemoglobin: 8.4 g/dL — ABNORMAL LOW (ref 12.0–15.0)
MCH: 28.4 pg (ref 26.0–34.0)
MCHC: 31.8 g/dL (ref 30.0–36.0)
MCV: 89.2 fL (ref 80.0–100.0)
Platelets: 167 10*3/uL (ref 150–400)
RBC: 2.96 MIL/uL — ABNORMAL LOW (ref 3.87–5.11)
RDW: 14.6 % (ref 11.5–15.5)
WBC: 10.9 10*3/uL — ABNORMAL HIGH (ref 4.0–10.5)
nRBC: 0 % (ref 0.0–0.2)

## 2020-12-01 LAB — BIRTH TISSUE RECOVERY COLLECTION (PLACENTA DONATION)

## 2020-12-01 LAB — SURGICAL PATHOLOGY

## 2020-12-01 MED ORDER — MAGNESIUM OXIDE -MG SUPPLEMENT 400 (240 MG) MG PO TABS
400.0000 mg | ORAL_TABLET | Freq: Every day | ORAL | Status: DC
  Administered 2020-12-01 – 2020-12-02 (×2): 400 mg via ORAL
  Filled 2020-12-01 (×2): qty 1

## 2020-12-01 MED ORDER — SODIUM CHLORIDE 0.9 % IV SOLN
500.0000 mg | INTRAVENOUS | Status: DC
  Administered 2020-12-01: 500 mg via INTRAVENOUS
  Filled 2020-12-01: qty 25

## 2020-12-01 MED ORDER — POLYSACCHARIDE IRON COMPLEX 150 MG PO CAPS
150.0000 mg | ORAL_CAPSULE | Freq: Every day | ORAL | Status: DC
  Administered 2020-12-02: 150 mg via ORAL
  Filled 2020-12-01: qty 1

## 2020-12-01 NOTE — Lactation Note (Signed)
This note was copied from a baby's chart. Lactation Consultation Note  Patient Name: Casey Clarke [id number] Date: 12/01/2020 Reason for consult: Follow-up assessment;Early term 37-38.6wks Age:39 hours  P2 mother whose infant is now 25 hours old.  This is an ETI at 37+1 weeks with a 4% weight loss since birth.  Mother attempted to breast feed her first child (now 71-1/2 years old) but never had a full milk supply.  She was only able to obtain approximately 1 ounce per pumping session after many weeks.  Mother has had breast augmentation.  Mother's desire is to pump and bottle feed only.  Baby is < 6 lbs and is consuming Enfamil 20 calore formula per MD request.  Mother had no questions/concerns this morning.  Praised mother for following the plan established yesterday for feeding and pumping.  Baby has been consuming appropriate volumes to date, however, I asked parents to make 20+ mls the goal for today now that she is >24 hours old.  Mother has been pumping every three hours and has obtained a few colostrum drops.  Continue to encourage breast massage and hand expression before/after pumping.    Parents are feeding well using the purple extra slow flow nipple.  Baby is burping and resting well between feedings.  She has had multiple voids/stools.    Encouraged mother to call for my assistance with latching as needed.  Mother commented on being exhausted and I encouraged resting as permitted today and getting a "quiet sign" placed on her door later this afternoon.  Father present and assisting with care; baby asleep in his lap.  Mother has a DEBP for home use.   Maternal Data Has patient been taught Hand Expression?: Yes Does the patient have breastfeeding experience prior to this delivery?: No (Attempted but she had very little milk supply)  Feeding Mother's Current Feeding Choice: Breast Milk and Formula Nipple Type: Extra Slow Flow  LATCH Score                     Lactation Tools Discussed/Used Tools: Pump;Flanges Flange Size: 24;27 Breast pump type: Double-Electric Breast Pump;Manual Pump Education:  (No review needed) Reason for Pumping: Breast stimulation, hx of low milk supply and early term infant Pumping frequency: Every three hours  Interventions Interventions: Education  Discharge Pump: DEBP;Manual;Personal WIC Program: No  Consult Status Consult Status: Follow-up Date: 12/02/20 Follow-up type: In-patient    Little Ishikawa 12/01/2020, 9:37 AM

## 2020-12-01 NOTE — Progress Notes (Signed)
Pt up and ambulated to bathroom for first void.  Denies pain.   Voided 200 ml.  Pt states she feels she has completely emptied her bladder. Back to bed without incident.

## 2020-12-01 NOTE — Social Work (Signed)
MOB was referred for history of anxiety.  * Referral screened out by Clinical Social Worker because none of the following criteria appear to apply: ~ History of anxiety/depression during this pregnancy, or of post-partum depression following prior delivery. ~ Diagnosis of anxiety and/or depression within last 3 years OR   MOB's symptoms currently being treated with medication and/or therapy. Per chart review,  MOB takes Lexapro10mg  for anxiety.   Please contact the Clinical Social Worker if needs arise, by [facility] request, or if MOB scores greater than 9/yes to question 10 on [place] Postpartum Depression Screen.  Kathrin Greathouse, MSW, LCSW [facility] and [place] Worker  [phone] 12/01/2020  9:01 AM

## 2020-12-01 NOTE — Progress Notes (Signed)
Subjective: POD #1 Live born female  Birth Weight: 5 lb 11.2 oz (2585 g) APGAR: 9, 9  Newborn Delivery   Birth date/time: 11/30/2020 08:50:00 Delivery type: C-Section, Low Transverse Trial of labor: No C-section categorization: Repeat     Baby name: "Everly" Delivering provider: Brien Few    Feeding: breast  Pain control at delivery: Spinal   Reports feeling well. Grateful for the sleep she did get throughout the night. She denies dizziness, light headedness and fatigue.   Patient reports tolerating PO.   Breast symptoms: none  Pain controlled with PO ibuprofen and IV Toradol.  Denies HA/SOB/C/P/N/V. Marland Kitchen She reports vaginal bleeding as normal, without clots.  She is ambulating, urinating without difficulty.  She also endorses flatus.   Objective:   VS:    Vitals:   11/30/20 1800 11/30/20 2200 12/01/20 0200 12/01/20 0615  BP: 100/64 (!) 95/51 101/62 120/66  Pulse: (!) 55 65 61 66  Resp: 18 16 18 18   Temp: 98.3 F (36.8 C) 97.9 F (36.6 C) 98 F (36.7 C) 98 F (36.7 C)  TempSrc: Oral  Oral Oral  SpO2:  97%  100%  Weight:      Height:          Intake/Output Summary (Last 24 hours) at 12/01/2020 0807 Last data filed at 11/30/2020 1600 Gross per 24 hour  Intake 2797.89 ml  Output 738 ml  Net 2059.89 ml        Recent Labs    11/28/20 0907 12/01/20 0536  WBC 7.0 10.9*  HGB 11.0* 8.4*  HCT 34.4* 26.4*  PLT 200 167     Blood type: --/--/O POS (05/09 0915)  Rubella: Immune (11/03 0000)  Vaccines: TDaP          UTD         Flu             UTD                    COVID-19 UTD   Physical Exam:  General: alert, cooperative and fatigued CV: Regular rate and rhythm or S1S2 present Resp: clear and unlabored breathing  Abdomen: soft, nontender, active bowel sounds in all 4 quadrants.  Incision: dry, intact and small amount of serous drainage present Uterine Fundus: firm, midline, below umbilicus, tender to palpation  Lochia: scant bleeding without clots   Ext: No edema or erythema noted. No sx of DVT present.       Assessment/Plan: 40 y.o.   POD# 1. [id number]                  Principal Problem:   Postpartum care following cesarean delivery 5/11  -Continue routine post/op orders. Advance diet as tolerated. Discussed frequent ambulation and bladder emptying to decrease bleeding and increase gut motility. Encouraged ambulation. PO ibuprofen PRN for pain management.  Active Problems:   GAD (generalized anxiety disorder)  -Stable status managed with 10mg  of Lexapro.    History of myomectomy   Cesarean delivery delivered - repeat    -Previous cesarean delivery affecting pregnancy. Continue to watch for sx of anxiety or depression.   Maternal anemia, with delivery - ABL  Hbg dropped to 8.4 post delivery. Initiate IV Venofer today. Starting tomorrow.  PO tylenol and mag oxide to maximize gut motility.   Plan for discharge tomorrow, pending clearance by Pediatrics.    Shadonna Benedick Isaias Sakai) Rollene Rotunda, BSN, RNC-OB  Student Nurse-Midwife   12/01/2020  11:50 AM

## 2020-12-01 NOTE — Progress Notes (Signed)
Pt up to ambulate to bathroom with slow and steady gait.  Foley removed with clear yellow urine drained.  Peri care given with instructions.  Pt back to bed without incident.

## 2020-12-02 MED ORDER — IBUPROFEN 600 MG PO TABS: 600.0000 mg | ORAL_TABLET | Freq: Four times a day (QID) | ORAL | 0 refills | Status: DC

## 2020-12-02 MED ORDER — ACETAMINOPHEN 500 MG PO TABS: 1000.0000 mg | ORAL_TABLET | Freq: Four times a day (QID) | ORAL | 0 refills | Status: DC

## 2020-12-02 MED ORDER — POLYSACCHARIDE IRON COMPLEX 150 MG PO CAPS: 150.0000 mg | ORAL_CAPSULE | Freq: Every day | ORAL | 1 refills | Status: DC

## 2020-12-02 MED ORDER — OXYCODONE-ACETAMINOPHEN 5-325 MG PO TABS: 1.0000 | ORAL_TABLET | ORAL | 0 refills | Status: DC | PRN

## 2020-12-02 MED ORDER — MAGNESIUM OXIDE -MG SUPPLEMENT 400 (240 MG) MG PO TABS: 400.0000 mg | ORAL_TABLET | Freq: Every day | ORAL | 1 refills | Status: DC

## 2020-12-02 NOTE — Discharge Instructions (Signed)

## 2020-12-02 NOTE — Discharge Summary (Signed)
OB Discharge Summary  Patient Name: Casey Clarke DOB: Apr 17, 1981 MRN: [id number]  Date of admission: 11/30/2020 Delivering provider: Brien Few   Admitting diagnosis: History of myomectomy [Z98.890] Previous cesarean delivery affecting pregnancy [O34.219] Intrauterine pregnancy: [redacted]w[redacted]d     Secondary diagnosis: Patient Active Problem List   Diagnosis Date Noted  . Maternal anemia, with delivery - ABL 12/01/2020  . History of myomectomy 11/30/2020  . Cesarean delivery delivered - repeat 11/30/2020  . Previous cesarean delivery affecting pregnancy 11/30/2020  . GAD (generalized anxiety disorder) 08/26/2019  . Postpartum care following cesarean delivery 5/11 06/16/2018  . Palpitations 03/11/2014    Date of discharge: 12/02/2020   Discharge diagnosis: Principal Problem:   Postpartum care following cesarean delivery 5/11 Active Problems:   GAD (generalized anxiety disorder)   History of myomectomy   Cesarean delivery delivered - repeat   Previous cesarean delivery affecting pregnancy   Maternal anemia, with delivery - ABL                                                            Post partum procedures:IV Venofer  Augmentation: N/A Pain control: Spinal  Laceration:None  Episiotomy:None  Complications: None  Hospital course:  Scheduled C/S   40 y.o. yo [id number] at [redacted]w[redacted]d was admitted to the hospital 11/30/2020 for scheduled cesarean section with the following indication:Elective Repeat.Delivery details are as follows:  Membrane Rupture Time/Date: 8:50 AM ,11/30/2020   Delivery Method:C-Section, Low Transverse  Details of operation can be found in separate operative note.  Patient had an uncomplicated postpartum course.  She is ambulating, tolerating a regular diet, passing flatus, and urinating well. Patient is discharged home in stable condition on  12/02/20        Newborn Data: Birth date:11/30/2020  Birth time:8:50 AM  Gender:Female  Living status:Living  Apgars:9 ,9   Weight:2585 g     Physical exam  Vitals:   12/01/20 0200 12/01/20 0615 12/01/20 1320 12/02/20 0630  BP: 101/62 120/66 107/65 119/71  Pulse: 61 66 72 82  Resp: 18 18 16    Temp: 98 F (36.7 C) 98 F (36.7 C) 98.1 F (36.7 C) 98.6 F (37 C)  TempSrc: Oral Oral Oral Oral  SpO2:  100% 96%   Weight:      Height:       General: alert, cooperative and no distress Lochia: appropriate Uterine Fundus: firm Incision: Healing well with no significant drainage DVT Evaluation: No evidence of DVT seen on physical exam. No significant calf/ankle edema. Labs: Lab Results  Component Value Date   WBC 10.9 (H) 12/01/2020   HGB 8.4 (L) 12/01/2020   HCT 26.4 (L) 12/01/2020   MCV 89.2 12/01/2020   PLT 167 12/01/2020   CMP Latest Ref Rng & Units 08/26/2019  Glucose 70 - 99 mg/dL 95  BUN 6 - 23 mg/dL 9  Creatinine 0.40 - 1.20 mg/dL 0.89  Sodium 135 - 145 mEq/L 136  Potassium 3.5 - 5.1 mEq/L 3.6  Chloride 96 - 112 mEq/L 102  CO2 19 - 32 mEq/L 28  Calcium 8.4 - 10.5 mg/dL 9.8  Total Protein 6.0 - 8.3 g/dL 7.4  Total Bilirubin 0.2 - 1.2 mg/dL 0.5  Alkaline Phos 39 - 117 U/L 33(L)  AST 0 - 37 U/L 15  ALT 0 - 35 U/L  20   Edinburgh Postnatal Depression Scale Screening Tool 11/30/2020 06/17/2018 06/16/2018  I have been able to laugh and see the funny side of things. 0 0 (No Data)  I have looked forward with enjoyment to things. 0 0 -  I have blamed myself unnecessarily when things went wrong. 0 1 -  I have been anxious or worried for no good reason. 0 0 -  I have felt scared or panicky for no good reason. 0 0 -  Things have been getting on top of me. 0 0 -  I have been so unhappy that I have had difficulty sleeping. 0 0 -  I have felt sad or miserable. 0 0 -  I have been so unhappy that I have been crying. 0 0 -  The thought of harming myself has occurred to me. 0 0 -  Edinburgh Postnatal Depression Scale Total 0 1 -    Vaccines: TDaP UTD         Flu    UTD         COVID-19    UTD  Discharge instructions:  per After Visit Summary  After Visit Meds:  Allergies as of 12/02/2020      Reactions   Codeine Other (See Comments)   "palpitations and heart races" Can take percocet per pt      Medication List    TAKE these medications   acetaminophen 500 MG tablet Commonly known as: TYLENOL Take 2 tablets (1,000 mg total) by mouth every 6 (six) hours.   ADVANCED PROBIOTIC PO Take 1 capsule by mouth every evening.   escitalopram 10 MG tablet Commonly known as: LEXAPRO TAKE 1 TABLET BY MOUTH EVERY DAY FOR ANXIETY What changed: See the new instructions.   famotidine 40 MG tablet Commonly known as: PEPCID Take 40 mg by mouth 2 (two) times daily.   folic acid 1 MG tablet Commonly known as: FOLVITE TAKE 2 TABLETS BY MOUTH EVERY DAY What changed: when to take this   ibuprofen 600 MG tablet Commonly known as: ADVIL Take 1 tablet (600 mg total) by mouth every 6 (six) hours.   iron polysaccharides 150 MG capsule Commonly known as: Ferrex 150 Take 1 capsule (150 mg total) by mouth daily.   magnesium oxide 400 (240 Mg) MG tablet Commonly known as: MAG-OX Take 1 tablet (400 mg total) by mouth daily. Start taking on: Dec 03, 2020   oxyCODONE-acetaminophen 5-325 MG tablet Commonly known as: PERCOCET/ROXICET Take 1-2 tablets by mouth every 4 (four) hours as needed for moderate pain.   prenatal multivitamin Tabs tablet Take 1 tablet by mouth every evening.   Vitamin D3 50 MCG (2000 UT) Tabs Take 2,000 Units by mouth every evening.   Zinc 50 MG Tabs Take 50 mg by mouth every evening.      Diet: routine diet  Activity: Advance as tolerated. Pelvic rest for 6 weeks.   Newborn Data: Live born female  Birth Weight: 5 lb 11.2 oz (2585 g) APGAR: 68, 9  Newborn Delivery   Birth date/time: 11/30/2020 08:50:00 Delivery type: C-Section, Low Transverse Trial of labor: No C-section categorization: Repeat     Named [facility] Baby Feeding: Bottle and  Breast Disposition:home with mother  Delivery Report:  Review the Delivery Report for details.    Follow up:  Follow-up Information    Brien Few, MD. Schedule an appointment as soon as possible for a visit in 6 week(s).   Specialty: Obstetrics and Gynecology Contact information: [address]  [place] [address] [phone]              Suzan Nailer, CNM, MSN 12/02/2020, 12:33 PM

## 2020-12-02 NOTE — Lactation Note (Signed)
This note was copied from a baby's chart. Lactation Consultation Note  Patient Name: Casey Clarke ACZYS'A Date: 12/02/2020 Reason for consult: Follow-up assessment;Early term 37-38.6wks;Infant weight loss Age:39 hours  Visited with mom of 59 hours old ETI female < 6 lbs, she's a P2. Mom has been pumping every 3 hours and also supplementing with 22 calorie formula. Milk lab has been bringing [employer] 22 calorie formula to the MBU for this baby.  Mom and baby are going home today, baby is at 8% weight loss. Reviewed discharge education, pumping schedule, lactogenesis II and normal ETI behavior. Mom understands the importance of fully emptying the breast to protect her supply; baby still not latching on consistently.  Feeding plan:  1. Encouraged mom to put baby to breast STS 8-12 times/24 hours or sooner if feeding cues are present 2. She'll continue pumping every 3 hours; ideally 8 pumping sessions/24 hours 3. Parents will continue supplementing with EBM/formula every 3 hours after feedings  FOB present at the time of [facility] consultation. Parents reported all questions and concerns were answered, they're both aware of [facility] OP services and will call PRN.  Maternal Data Has patient been taught Hand Expression?: Yes  Feeding Mother's Current Feeding Choice: Breast Milk and Formula  LATCH Score                    Lactation Tools Discussed/Used Tools: Coconut oil;Pump Breast pump type: Double-Electric Breast Pump;Manual Pump Education: Setup, frequency, and cleaning Reason for Pumping: ETI < 6 lbs, Hx of low milk supply Pumping frequency: q 3 hours Pumped volume:  (drops)  Interventions Interventions: Breast feeding basics reviewed;DEBP;Coconut oil  Discharge Discharge Education: Engorgement and breast care;Warning signs for feeding baby;Outpatient recommendation Pump: DEBP;Manual  Consult Status Consult Status: Complete Date: 12/02/20 Follow-up type: Call as  needed    [place] 12/02/2020, 10:51 AM

## 2021-01-27 ENCOUNTER — Other Ambulatory Visit: Payer: Self-pay | Admitting: Primary Care

## 2021-01-27 DIAGNOSIS — F411 Generalized anxiety disorder: Secondary | ICD-10-CM

## 2021-02-22 ENCOUNTER — Other Ambulatory Visit: Payer: Self-pay | Admitting: Primary Care

## 2021-02-22 DIAGNOSIS — F411 Generalized anxiety disorder: Secondary | ICD-10-CM

## 2021-04-01 ENCOUNTER — Other Ambulatory Visit: Payer: Self-pay | Admitting: Primary Care

## 2021-04-01 DIAGNOSIS — F411 Generalized anxiety disorder: Secondary | ICD-10-CM

## 2021-04-02 NOTE — Telephone Encounter (Signed)
Office visit required for further refills.  Patient is overdue and has already been notified previously. I have not evaluated her since March 2021.  It looks like I only prescribe her Lexapro so I am fine if she would like to do a virtual visit.   Let me know when she's scheduled. I will give her enough Lexapro to last until then.

## 2021-04-03 NOTE — Telephone Encounter (Signed)
Sent my chart message to make appointment. Hold to follow up

## 2021-04-04 ENCOUNTER — Telehealth (INDEPENDENT_AMBULATORY_CARE_PROVIDER_SITE_OTHER): Payer: 59 | Admitting: Primary Care

## 2021-04-04 ENCOUNTER — Other Ambulatory Visit: Payer: Self-pay

## 2021-04-04 ENCOUNTER — Encounter: Payer: Self-pay | Admitting: Primary Care

## 2021-04-04 DIAGNOSIS — F411 Generalized anxiety disorder: Secondary | ICD-10-CM | POA: Diagnosis not present

## 2021-04-04 DIAGNOSIS — R059 Cough, unspecified: Secondary | ICD-10-CM | POA: Diagnosis not present

## 2021-04-04 MED ORDER — ESCITALOPRAM OXALATE 10 MG PO TABS: ORAL_TABLET | ORAL | 3 refills | Status: DC

## 2021-04-04 MED ORDER — BENZONATATE 200 MG PO CAPS: 200.0000 mg | ORAL_CAPSULE | Freq: Three times a day (TID) | ORAL | 0 refills | Status: DC | PRN

## 2021-04-04 NOTE — Assessment & Plan Note (Signed)
Well controlled on Lexapro 20 mg.  Continue same. Refills sent to pharmacy.

## 2021-04-04 NOTE — Progress Notes (Signed)
Patient ID: Casey Clarke, female    DOB: 10/18/1980, 40 y.o.   MRN: [id number]  Virtual visit completed through [place], a video enabled telemedicine application. Due to national recommendations of social distancing due to COVID-19, a virtual visit is felt to be most appropriate for this patient at this time. Reviewed limitations, risks, security and privacy concerns of performing a virtual visit and the availability of in person appointments. I also reviewed that there may be a patient responsible charge related to this service. The patient agreed to proceed.   Patient location: school Provider location: Financial controller at [facility], office Persons participating in this virtual visit: patient, provider   If any vitals were documented, they were collected by patient at home unless specified below.    Ht '5\' 4"'$  (1.626 m)   Wt 158 lb (71.7 kg)   BMI 27.12 kg/m    CC: Follow up and Cough Subjective:   HPI: Casey Clarke is a 40 y.o. female with a history of palpitations, GERD, GAD presenting on 04/04/2021 for follow up of anxiety and to discuss sore throat.   Currently prescribed Lexapro 10 mg daily for anxiety, doing well on this regimen.   Acute non productive cough for the last five days, also with sore throat, sweaty, chills. She tested negative for Covid-19 twice. Denies fevers. Today she's feeling better but cough is worse. She's tried Delsym a few other OTC cough suppressants without improvement.       Relevant past medical, surgical, family and social history reviewed and updated as indicated. Interim medical history since our last visit reviewed. Allergies and medications reviewed and updated. Outpatient Medications Prior to Visit  Medication Sig Dispense Refill   Cholecalciferol (VITAMIN D3) 50 MCG (2000 UT) TABS Take 2,000 Units by mouth every evening.     folic acid (FOLVITE) 1 MG tablet TAKE 2 TABLETS BY MOUTH EVERY DAY (Patient taking differently: Take 2 mg by mouth  every evening.) 60 tablet 12   Prenatal Vit-Fe Fumarate-FA (PRENATAL MULTIVITAMIN) TABS tablet Take 1 tablet by mouth every evening.     Probiotic Product (ADVANCED PROBIOTIC PO) Take 1 capsule by mouth every evening.     Zinc 50 MG TABS Take 50 mg by mouth every evening.     escitalopram (LEXAPRO) 10 MG tablet TAKE 1 TABLET BY MOUTH EVERY DAY FOR ANXIETY 30 tablet 0   acetaminophen (TYLENOL) 500 MG tablet Take 2 tablets (1,000 mg total) by mouth every 6 (six) hours. 30 tablet 0   famotidine (PEPCID) 40 MG tablet Take 40 mg by mouth 2 (two) times daily.     ibuprofen (ADVIL) 600 MG tablet Take 1 tablet (600 mg total) by mouth every 6 (six) hours. 30 tablet 0   iron polysaccharides (FERREX 150) 150 MG capsule Take 1 capsule (150 mg total) by mouth daily. 30 capsule 1   magnesium oxide (MAG-OX) 400 (240 Mg) MG tablet Take 1 tablet (400 mg total) by mouth daily. 30 tablet 1   oxyCODONE-acetaminophen (PERCOCET/ROXICET) 5-325 MG tablet Take 1-2 tablets by mouth every 4 (four) hours as needed for moderate pain. 30 tablet 0   No facility-administered medications prior to visit.     Per HPI unless specifically indicated in ROS section below Review of Systems  Constitutional:  Positive for fatigue. Negative for fever.  HENT:  Positive for postnasal drip and sore throat. Negative for congestion.   Respiratory:  Positive for cough. Negative for shortness of breath.   Psychiatric/Behavioral:  The patient is not nervous/anxious.   Objective:  Ht '5\' 4"'$  (1.626 m)   Wt 158 lb (71.7 kg)   BMI 27.12 kg/m   Wt Readings from Last 3 Encounters:  04/04/21 158 lb (71.7 kg)  11/30/20 158 lb 12.8 oz (72 kg)  11/16/20 161 lb (73 kg)       Physical exam: Gen: alert, NAD, not ill appearing Pulm: speaks in complete sentences without increased work of breathing Psych: normal mood, normal thought content      Results for orders placed or performed during the hospital encounter of 11/30/20  CBC  Result  Value Ref Range   WBC 10.9 (H) 4.0 - 10.5 K/uL   RBC 2.96 (L) 3.87 - 5.11 MIL/uL   Hemoglobin 8.4 (L) 12.0 - 15.0 g/dL   HCT 26.4 (L) 36.0 - 46.0 %   MCV 89.2 80.0 - 100.0 fL   MCH 28.4 26.0 - 34.0 pg   MCHC 31.8 30.0 - 36.0 g/dL   RDW 14.6 11.5 - 15.5 %   Platelets 167 150 - 400 K/uL   nRBC 0.0 0.0 - 0.2 %  Collect bld for placenta donatation  Result Value Ref Range   Placenta donation bld collect Collected by Laboratory   Surgical pathology  Result Value Ref Range   SURGICAL PATHOLOGY      SURGICAL PATHOLOGY CASE: [id number] PATIENT: Casey Clarke Surgical Pathology Report     Clinical History: 37w 1d; previous cesarean section, history of myomectomy (crm)     FINAL MICROSCOPIC DIAGNOSIS:  A. FALLOPIAN TUBE, LEFT, TUBAL LIGATION: - Unremarkable Fallopian tube.  B. FALLOPIAN TUBE, RIGHT, TUBAL LIGATION: - Unremarkable Fallopian tube.    GROSS DESCRIPTION:  Specimen A: Received fresh labeled left is a 0.9 x 0.5 cm segment of tubular tissue without fimbria, has a pink-red smooth serosa and unremarkable cut surface.  Bisected and submitted in one block.  Specimen B: Received fresh labeled right is a 1 cm in length and 0.5 cm in diameter tubular tissue without fimbria, has a pink-red smooth serosa and unremarkable cut surface.  SW 11/30/2020   Final Diagnosis performed by Claudette Laws, MD.   Electronically signed 12/01/2020 Technical and / or Professional components performed at [facility], [place] [address]  [place], [place] [address].  Immunohistochemistry Technical component (if applicable) was performed at [facility]. [address] [place], [place], [place] [address].   IMMUNOHISTOCHEMISTRY DISCLAIMER (if applicable): Some of these immunohistochemical stains may have been developed and the performance characteristics determine by [facility]. Some may not have been cleared or approved by the U.S.  Food and Drug Administration. The FDA has determined that such clearance or approval is not necessary. This test is used for clinical purposes. It should not be regarded as investigational or for research. This laboratory is certified under the [place] (CLIA-88) as qualified to perform high complexity clinical laboratory testing.  The controls stained appropriately.    Assessment & Plan:   Problem List Items Addressed This Visit       Other   GAD (generalized anxiety disorder)    Well controlled on Lexapro 20 mg.  Continue same. Refills sent to pharmacy.       Relevant Medications   benzonatate (TESSALON) 200 MG capsule   escitalopram (LEXAPRO) 10 MG tablet   Cough    Acute for the last 5 days. Sounds to be viral, she appears overall well.  Will send in Rx  for [employer] as OTC meds are not helpful. She will update in a few days If no improvement.         Meds ordered this encounter  Medications   benzonatate (TESSALON) 200 MG capsule    Sig: Take 1 capsule (200 mg total) by mouth 3 (three) times daily as needed for cough.    Dispense:  15 capsule    Refill:  0    Order Specific Question:   Supervising Provider    Answer:   BEDSOLE, AMY E [[id number]]   escitalopram (LEXAPRO) 10 MG tablet    Sig: TAKE 1 TABLET BY MOUTH EVERY DAY FOR ANXIETY    Dispense:  90 tablet    Refill:  3    Order Specific Question:   Supervising Provider    Answer:   BEDSOLE, AMY E [[id number]]   No orders of the defined types were placed in this encounter.   I discussed the assessment and treatment plan with the patient. The patient was provided an opportunity to ask questions and all were answered. The patient agreed with the plan and demonstrated an understanding of the instructions. The patient was advised to call back or seek an in-person evaluation if the symptoms worsen or if the condition fails to improve as anticipated.  Follow up  plan:  Continue taking Lexapro 20 mg. I sent refills to the pharmacy.  You may take Benzonatate capsules for cough. Take 1 capsule by mouth three times daily as needed for cough.  Please update me Thursday this week if your symptoms have not improved.   Pleas Koch, NP

## 2021-04-04 NOTE — Assessment & Plan Note (Signed)
Acute for the last 5 days. Sounds to be viral, she appears overall well.  Will send in Rx for Tessalon Perles as OTC meds are not helpful. She will update in a few days If no improvement.

## 2021-04-04 NOTE — Patient Instructions (Signed)
Continue taking Lexapro 20 mg. I sent refills to the pharmacy.  You may take Benzonatate capsules for cough. Take 1 capsule by mouth three times daily as needed for cough.  Please update me Thursday this week if your symptoms have not improved.

## 2021-04-05 DIAGNOSIS — R059 Cough, unspecified: Secondary | ICD-10-CM

## 2021-04-06 ENCOUNTER — Ambulatory Visit: Payer: 59 | Admitting: Primary Care

## 2021-04-06 MED ORDER — AZITHROMYCIN 250 MG PO TABS: ORAL_TABLET | ORAL | 0 refills | Status: DC

## 2021-05-04 ENCOUNTER — Other Ambulatory Visit: Payer: Self-pay | Admitting: Obstetrics & Gynecology

## 2021-08-23 IMAGING — CT CT RENAL STONE PROTOCOL
3 of 4 series · 7 of 46 positions shown, 13 images · non-contrast
Comparison: July 15, 2016

CLINICAL DATA: Urinary frequency and urgency. Urinary tract
infection.

EXAM:
CT ABDOMEN AND PELVIS WITHOUT CONTRAST
TECHNIQUE: Multidetector CT imaging of the abdomen and pelvis was performed
following the standard protocol without oral or IV contrast.

[Series 4: lung bases · axial · 0.63mm/px · z∈[-164,-124]mm · 3 of 16 slices shown, 7 images]
[im 4/16  soft-tissue]
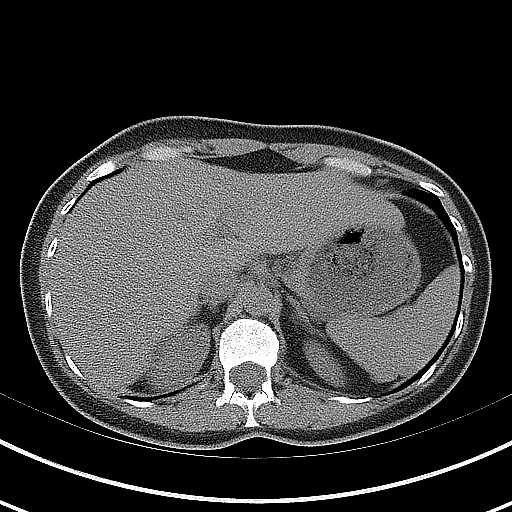
[im 4/16  lung]
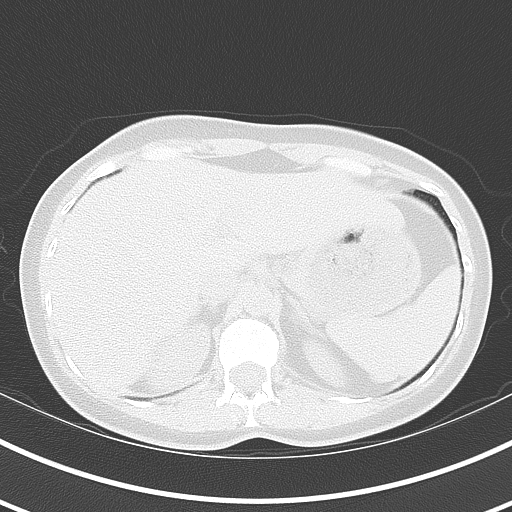
[im 4/16  bone]
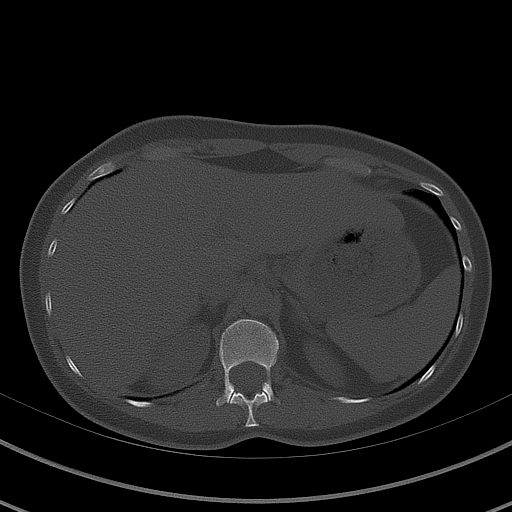
[im 8/16  soft-tissue]
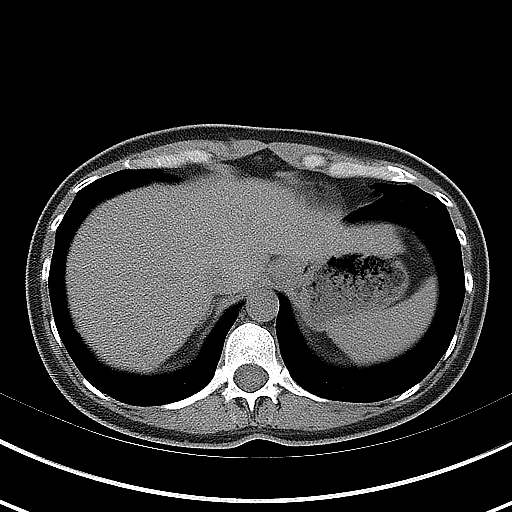
[im 8/16  lung]
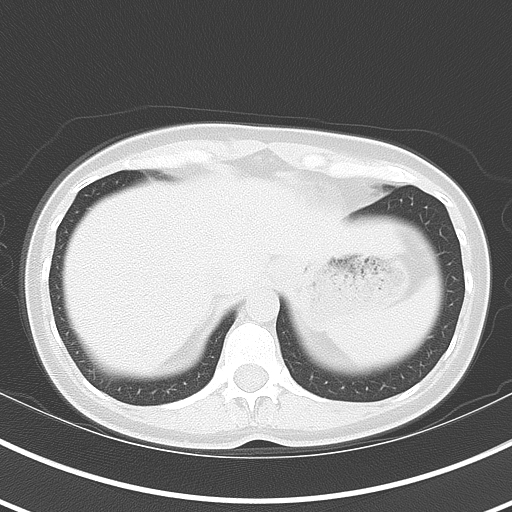
[im 12/16  soft-tissue]
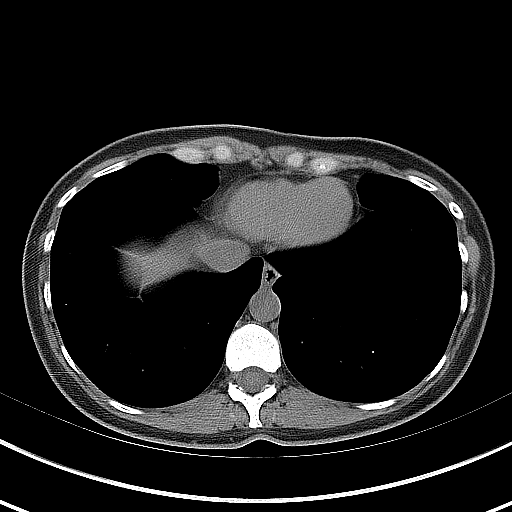
[im 12/16  lung]
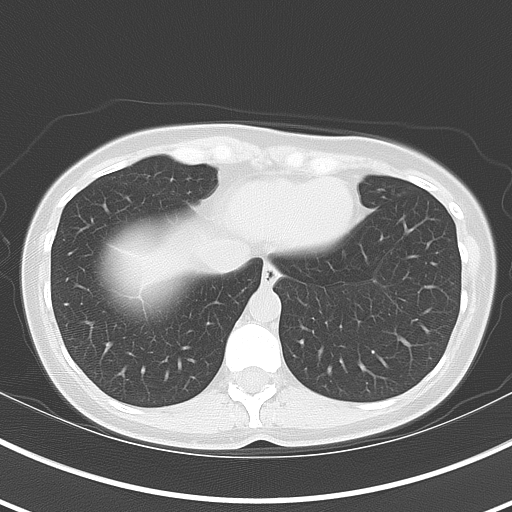

[Series 5: coronal · coronal · 0.66mm/px · 3 of 114 slices shown, 4 images]
[im 38/114  soft-tissue]
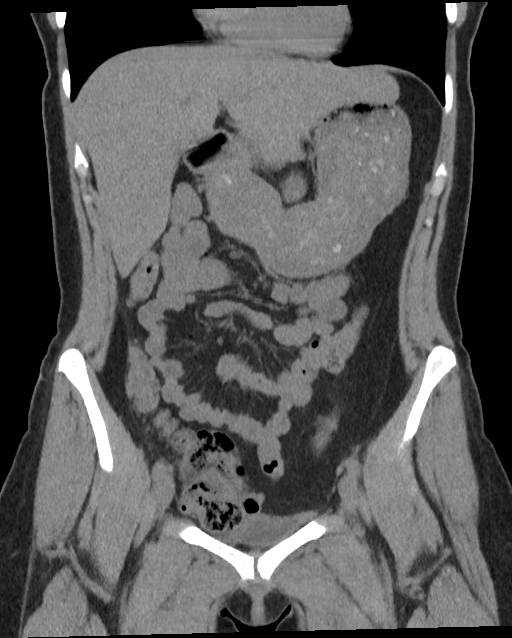
[im 51/114  soft-tissue]
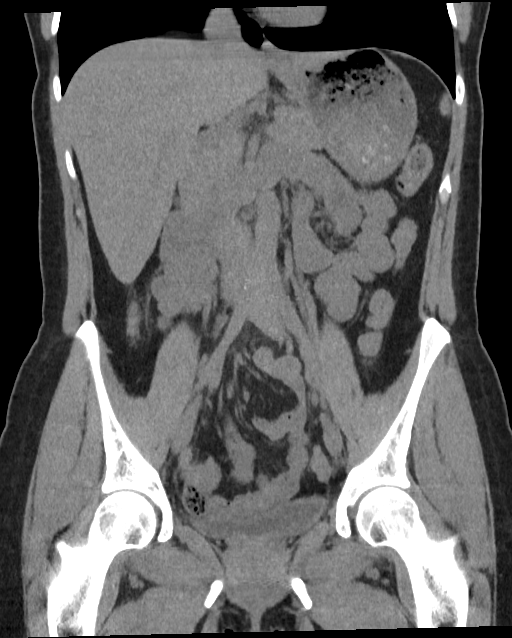
[im 51/114  bone]
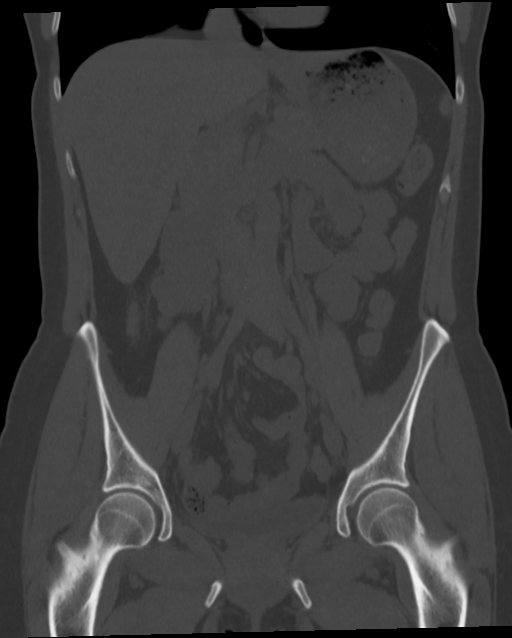
[im 63/114  soft-tissue]
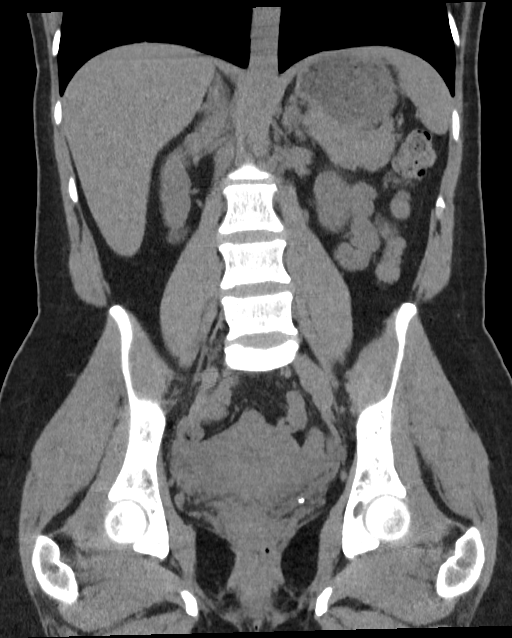

[Series 6: sagittal · sagittal · 0.44mm/px · 1 of 169 slices shown, 2 images]
[im 57/169  soft-tissue]
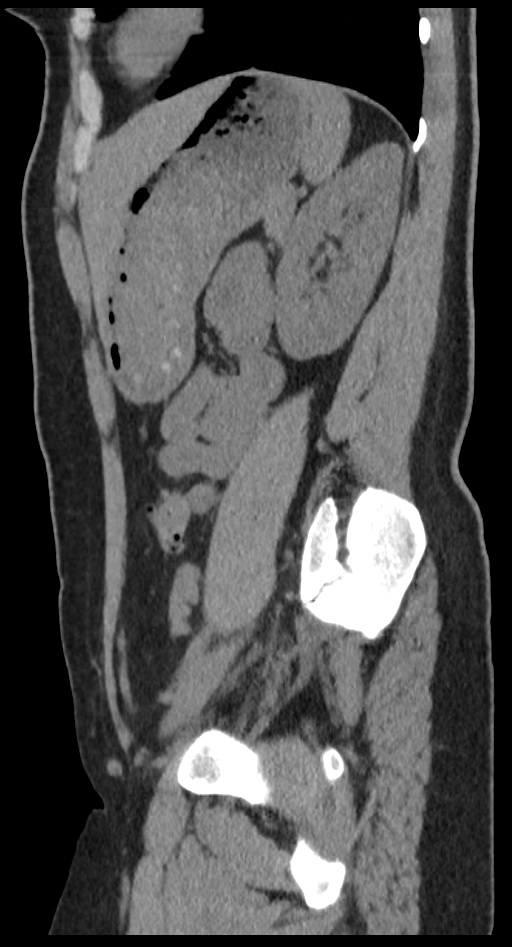
[im 57/169  bone]
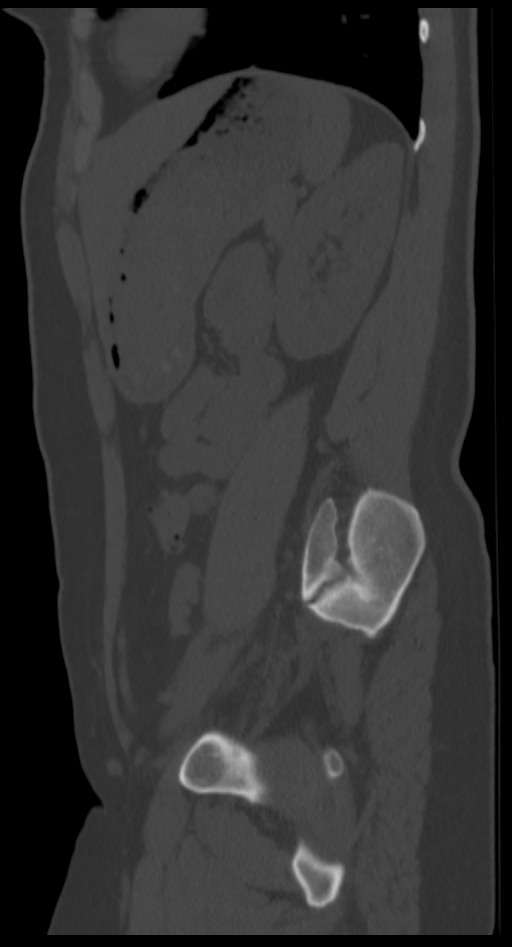

[7 of 46 positions shown; findings below may reference images not displayed]

FINDINGS: Lower chest: Lung bases are clear.

Hepatobiliary: No focal liver lesions are evident on this
noncontrast enhanced study. Gallbladder is contracted. There is no
appreciable biliary duct dilatation.

Pancreas: There is no pancreatic mass or inflammatory focus.

Spleen: No splenic lesions are evident.

Adrenals/Urinary Tract: Adrenals bilaterally appear normal. Kidneys
bilaterally show no evident mass or hydronephrosis on either side.
There is a 1 mm calculus in the upper pole of the left kidney. There
are scattered tiny calculi throughout the right kidney. There is a 4
mm calcification in the distal left ureter near the ureterovesical
junction. No other ureteral calculi are evident. Urinary bladder is
midline with wall thickness within normal limits.

Stomach/Bowel: Stomach is filled with food material. There is no
appreciable bowel wall or mesenteric thickening. The terminal ileum
region appears normal. No bowel obstruction evident. There is no
free air or portal venous air.

Vascular/Lymphatic: There is no abdominal aortic aneurysm. There are
foci of calcification in the distal aorta. No adenopathy is
appreciable in the abdomen or pelvis.

Reproductive: The uterus is anteverted. There is no pelvic mass.
However, there is free fluid in the cul-de-sac region.

Other: The appendix appears unremarkable. There is no abscess in the
abdomen or pelvis. There is no ascites beyond the mild fluid in the
cul-de-sac region.

Musculoskeletal: The bony calvarium appears intact. There are no
intramuscular or abdominal wall lesions.
IMPRESSION: 1. 4 mm calculus distal left ureter without appreciable
hydronephrosis.

2. Tiny calculi in the right kidney. 1 mm calculus upper pole left
kidney.

3. Small amount of fluid in the cul-de-sac suggesting potential
recent ovarian cyst rupture. No pelvic mass evident currently by CT.

4. No bowel obstruction. No evident abscess in the abdomen or
pelvis. Appendix appears normal.

These results will be called to the ordering clinician or
representative by the Radiologist Assistant, and communication
documented in the PACS or zVision Dashboard.

## 2021-09-07 ENCOUNTER — Encounter: Payer: Self-pay | Admitting: Primary Care

## 2021-09-07 ENCOUNTER — Ambulatory Visit: Payer: 59 | Admitting: Primary Care

## 2021-09-07 ENCOUNTER — Other Ambulatory Visit: Payer: Self-pay

## 2021-09-07 VITALS — BP 120/72 | HR 82 | Temp 98.3°F | Ht 64.0 in | Wt 150.0 lb

## 2021-09-07 DIAGNOSIS — F411 Generalized anxiety disorder: Secondary | ICD-10-CM

## 2021-09-07 DIAGNOSIS — Z0000 Encounter for general adult medical examination without abnormal findings: Secondary | ICD-10-CM | POA: Diagnosis not present

## 2021-09-07 DIAGNOSIS — F418 Other specified anxiety disorders: Secondary | ICD-10-CM | POA: Diagnosis not present

## 2021-09-07 LAB — COMPREHENSIVE METABOLIC PANEL
ALT: 27 U/L (ref 0–35)
AST: 18 U/L (ref 0–37)
Albumin: 4.5 g/dL (ref 3.5–5.2)
Alkaline Phosphatase: 35 U/L — ABNORMAL LOW (ref 39–117)
BUN: 11 mg/dL (ref 6–23)
CO2: 34 mEq/L — ABNORMAL HIGH (ref 19–32)
Calcium: 9.4 mg/dL (ref 8.4–10.5)
Chloride: 100 mEq/L (ref 96–112)
Creatinine, Ser: 0.72 mg/dL (ref 0.40–1.20)
GFR: 105.12 mL/min (ref >60.00)
Glucose, Bld: 98 mg/dL (ref 70–99)
Potassium: 3.9 mEq/L (ref 3.5–5.1)
Sodium: 136 mEq/L (ref 135–145)
Total Bilirubin: 0.5 mg/dL (ref 0.2–1.2)
Total Protein: 7.1 g/dL (ref 6.0–8.3)

## 2021-09-07 LAB — LIPID PANEL
Cholesterol: 211 mg/dL — ABNORMAL HIGH (ref 0–200)
HDL: 49.4 mg/dL (ref >39.00)
LDL Cholesterol: 124 mg/dL — ABNORMAL HIGH (ref 0–99)
NonHDL: 161.57
Total CHOL/HDL Ratio: 4
Triglycerides: 187 mg/dL — ABNORMAL HIGH (ref 0.0–149.0)
VLDL: 37.4 mg/dL (ref 0.0–40.0)

## 2021-09-07 LAB — CBC WITH DIFFERENTIAL/PLATELET
Basophils Absolute: 0 10*3/uL (ref 0.0–0.1)
Basophils Relative: 0.4 % (ref 0.0–3.0)
Eosinophils Absolute: 0.1 10*3/uL (ref 0.0–0.7)
Eosinophils Relative: 2.1 % (ref 0.0–5.0)
HCT: 41.6 % (ref 36.0–46.0)
Hemoglobin: 13.8 g/dL (ref 12.0–15.0)
Lymphocytes Relative: 31.8 % (ref 12.0–46.0)
Lymphs Abs: 2.1 10*3/uL (ref 0.7–4.0)
MCHC: 33.1 g/dL (ref 30.0–36.0)
MCV: 89.5 fl (ref 78.0–100.0)
Monocytes Absolute: 0.5 10*3/uL (ref 0.1–1.0)
Monocytes Relative: 7.1 % (ref 3.0–12.0)
Neutro Abs: 3.8 10*3/uL (ref 1.4–7.7)
Neutrophils Relative %: 58.6 % (ref 43.0–77.0)
Platelets: 325 10*3/uL (ref 150.0–400.0)
RBC: 4.65 Mil/uL (ref 3.87–5.11)
RDW: 13.6 % (ref 11.5–15.5)
WBC: 6.4 10*3/uL (ref 4.0–10.5)

## 2021-09-07 LAB — TSH: TSH: 1.47 u[IU]/mL (ref 0.35–5.50)

## 2021-09-07 MED ORDER — PROPRANOLOL HCL 10 MG PO TABS: ORAL_TABLET | ORAL | 0 refills | Status: DC

## 2021-09-07 NOTE — Progress Notes (Signed)
Subjective:    Patient ID: Casey Clarke, female    DOB: [birth year], 41 y.o.   MRN: [id number]  HPI  Casey Clarke is a very pleasant 41 y.o. female who presents today for complete physical and follow up of chronic conditions.  She would also like to discuss test anxiety. Chronic for years that date back to college years, but mostly with significant exams. 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 She's never tried this before. Overall lexapro 10 mg daily has been effective.   Immunizations: -Tetanus: 2022 -Influenza: Completed this season  -Covid-19: 2 vaccines   Diet: Fair diet.  Exercise: No regular exercise.   Eye exam: Completed years ago Dental exam: Completes semi-annually   Pap Smear: Completed in 2021 per GYN  BP Readings from Last 3 Encounters:  09/07/21 120/72  12/02/20 119/71  02/26/20 120/78         Review of Systems  Constitutional:  Negative for unexpected weight change.  HENT:  Negative for rhinorrhea.   Respiratory:  Negative for cough and shortness of breath.   Cardiovascular:  Negative for chest pain.  Gastrointestinal:  Negative for constipation and diarrhea.  Genitourinary:  Negative for difficulty urinating and menstrual problem.  Musculoskeletal:  Negative for arthralgias and myalgias.  Skin:  Negative for rash.  Allergic/Immunologic: Negative for environmental allergies.  Neurological:  Negative for dizziness and headaches.  Psychiatric/Behavioral:  The patient is nervous/anxious.         Past Medical History:  Diagnosis Date   Abnormal Pap smear of cervix 2014, 03/2014   ASCUS, pos HR HPV, 18/45; 2015 LGSIL, + HR HPV   AMA (advanced maternal age) primigravida 35+    Cesarean delivery delivered - repeat 11/30/2020   Dermoid cyst of ovary, left 03/2016   Fibroid     GERD (gastroesophageal reflux disease)    H/O myomectomy 06/16/2018   Heart murmur    Heterozygous MTHFR mutation C677T    History of gastric ulcer    remote hx - resolved   History of kidney stones    History of palpitations    hypersenitive to codeine and per pt sometimes tachy   History of recurrent miscarriages    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   Infertility, female    Maternal anemia, with delivery - ABL 12/01/2020   Migraine    Nocturia    PONV (postoperative nausea and vomiting)    itching from fentanyl   Postpartum care following cesarean delivery 5/11 06/16/2018   Pregnancy with history of uterine myomectomy 06/16/2018   Previous cesarean delivery affecting pregnancy 11/30/2020   S/P cesarean section 06/16/2018    Social History   Socioeconomic History   Marital status: Married    Spouse name: Not on file   Number of children: 0   Years of education: Not on file   Highest education level: Not on file  Occupational History   Not on file  Tobacco Use   Smoking status: Never   Smokeless tobacco: Never  Vaping Use   Vaping Use: Never used  Substance and Sexual Activity   Alcohol use: Yes    Alcohol/week: 1.0 - 2.0 standard drink    Types: 1 - 2 Glasses of wine per week    Comment: social   Drug use: No   Sexual activity:  Yes    Partners: Male    Birth control/protection: None  Other Topics Concern   Not on file  Social History Narrative   Not on file   Social Determinants of Health   Financial Resource Strain: Not on file  Food Insecurity: Not on file  Transportation Needs: Not on file  Physical Activity: Not on file  Stress: Not on file  Social Connections: Not on file  Intimate Partner Violence: Not on file    Past Surgical History:  Procedure Laterality Date   AUGMENTATION MAMMAPLASTY Bilateral 01/17/10   implants   CESAREAN SECTION N/A 06/16/2018   Procedure: Primary CESAREAN SECTION;  Surgeon: Brien Few, MD;  Location: [place];   Service: Obstetrics;  Laterality: N/A;  EDD: 07/07/18 Allergy: Codeine   CESAREAN SECTION WITH BILATERAL TUBAL LIGATION N/A 11/30/2020   Procedure: Repeat CESAREAN SECTION;  Surgeon: Brien Few, MD;  Location: [place] [facility];  Service: Obstetrics;  Laterality: N/A;  EDD: 12/20/20   CHROMOPERTUBATION N/A 06/19/2016   Procedure: CHROMOPERTUBATION;  Surgeon: Governor Specking, MD;  Location: [facility];  Service: Gynecology;  Laterality: N/A;   COLPOSCOPY  2014, 2015   2015 LGSIL, neg ECC   DILATION AND EVACUATION  12/30/2007   missed ab   LAPAROSCOPIC GELPORT ASSISTED MYOMECTOMY N/A 06/19/2016   Procedure: LAPAROSCOPIC GELPORT ASSISTED MYOMECTOMY;  Surgeon: Governor Specking, MD;  Location: [place];  Service: Gynecology;  Laterality: N/A;   LAPAROSCOPY     MYOMECTOMY   OVARIAN CYST REMOVAL Left 06/19/2016   Procedure: LEFT OVARIAN CYSTECTOMY;  Surgeon: Governor Specking, MD;  Location: [place];  Service: Gynecology;  Laterality: Left;   TRANSTHORACIC ECHOCARDIOGRAM  01/08/2014   ef 56%/  mild to [employer] TR/  trivial PR    Family History  Problem Relation Age of Onset   Thyroid disease Mother    Hypertension Mother    Hypertension Maternal Grandmother    Thyroid disease Maternal Grandmother    Kidney disease Maternal Grandmother    Heart attack Maternal Grandfather    Cancer Maternal Grandfather        lymph nodes   Cancer Paternal Grandmother        stomach   Heart disease Paternal Grandfather        pace maker   Hypotension Paternal Grandfather    Diabetes Maternal Aunt     Allergies  Allergen Reactions   Codeine Other (See Comments)    "palpitations and heart races" Can take percocet per pt    Current Outpatient Medications on File Prior to Visit  Medication Sig Dispense Refill   Cholecalciferol (VITAMIN D3) 50 MCG (2000 UT) TABS Take 2,000 Units by mouth every evening.     escitalopram (LEXAPRO) 10 MG tablet TAKE 1  TABLET BY MOUTH EVERY DAY FOR ANXIETY 90 tablet 3   folic acid (FOLVITE) 1 MG tablet Take 2 tablets (2 mg total) by mouth every evening. 60 tablet 3   Prenatal Vit-Fe Fumarate-FA (PRENATAL MULTIVITAMIN) TABS tablet Take 1 tablet by mouth every evening.     Probiotic Product (ADVANCED PROBIOTIC PO) Take 1 capsule by mouth every evening.     Zinc 50 MG TABS Take 50 mg by mouth every evening.     No current facility-administered medications on file prior to visit.    BP 120/72    Pulse 82    Temp 98.3 F (36.8 C) (Oral)    Ht 5\' 4"  (1.626 m)    Wt  150 lb (68 kg)    SpO2 99%    BMI 25.75 kg/m  Objective:   Physical Exam HENT:     Right Ear: Tympanic membrane and ear canal normal.     Left Ear: Tympanic membrane and ear canal normal.     Nose: Nose normal.  Eyes:     Conjunctiva/sclera: Conjunctivae normal.     Pupils: Pupils are equal, round, and reactive to light.  Neck:     Thyroid: No thyromegaly.  Cardiovascular:     Rate and Rhythm: Normal rate and regular rhythm.     Heart sounds: No murmur heard. Pulmonary:     Effort: Pulmonary effort is normal.     Breath sounds: Normal breath sounds. No rales.  Abdominal:     General: Bowel sounds are normal.     Palpations: Abdomen is soft.     Tenderness: There is no abdominal tenderness.  Musculoskeletal:        General: Normal range of motion.     Cervical back: Neck supple.  Lymphadenopathy:     Cervical: No cervical adenopathy.  Skin:    General: Skin is warm and dry.     Findings: No rash.  Neurological:     Mental Status: She is alert and oriented to person, place, and time.     Cranial Nerves: No cranial nerve deficit.     Deep Tendon Reflexes: Reflexes are normal and symmetric.  Psychiatric:        Mood and Affect: Mood normal.          Assessment & Plan:      This visit occurred during the SARS-CoV-2 public health emergency.  Safety protocols were in place, including screening questions prior to the visit,  additional usage of staff PPE, and extensive cleaning of exam room while observing appropriate contact time as indicated for disinfecting solutions.

## 2021-09-07 NOTE — Assessment & Plan Note (Signed)
Immunizations UTD. Pap smear UTD, follows with GYN.  Discussed the importance of a healthy diet and regular exercise in order for weight loss, and to reduce the risk of further co-morbidity.  Exam today stable. Labs pending.

## 2021-09-07 NOTE — Patient Instructions (Signed)
Stop by the lab prior to leaving today. I will notify you of your results once received.   You may take 1 or 2 tablets of propranolol 10 mg 30-60 minutes prior to your exam. Try this out before your exam.   It was a pleasure to see you today!  Preventive Care 74-40 Years Old, Female Preventive care refers to lifestyle choices and visits with your health care provider that can promote health and wellness. Preventive care visits are also called wellness exams. What can I expect for my preventive care visit? Counseling During your preventive care visit, your health care provider may ask about your: Medical history, including: Past medical problems. Family medical history. Pregnancy history. Current health, including: Menstrual cycle. Method of birth control. Emotional well-being. Home life and relationship well-being. Sexual activity and sexual health. Lifestyle, including: Alcohol, nicotine or tobacco, and drug use. Access to firearms. Diet, exercise, and sleep habits. Work and work Statistician. Sunscreen use. Safety issues such as seatbelt and bike helmet use. Physical exam Your health care provider may check your: Height and weight. These may be used to calculate your BMI (body mass index). BMI is a measurement that tells if you are at a healthy weight. Waist circumference. This measures the distance around your waistline. This measurement also tells if you are at a healthy weight and may help predict your risk of certain diseases, such as type 2 diabetes and high blood pressure. Heart rate and blood pressure. Body temperature. Skin for abnormal spots. What immunizations do I need? Vaccines are usually given at various ages, according to a schedule. Your health care provider will recommend vaccines for you based on your age, medical history, and lifestyle or other factors, such as travel or where you work. What tests do I need? Screening Your health care provider may recommend  screening tests for certain conditions. This may include: Pelvic exam and Pap test. Lipid and cholesterol levels. Diabetes screening. This is done by checking your blood sugar (glucose) after you have not eaten for a while (fasting). Hepatitis B test. Hepatitis C test. HIV (human immunodeficiency virus) test. STI (sexually transmitted infection) testing, if you are at risk. BRCA-related cancer screening. This may be done if you have a family history of breast, ovarian, tubal, or peritoneal cancers. Talk with your health care provider about your test results, treatment options, and if necessary, the need for more tests. Follow these instructions at home: Eating and drinking  Eat a healthy diet that includes fresh fruits and vegetables, whole grains, lean protein, and low-fat dairy products. Take vitamin and mineral supplements as recommended by your health care provider. Do not drink alcohol if: Your health care provider tells you not to drink. You are pregnant, may be pregnant, or are planning to become pregnant. If you drink alcohol: Limit how much you have to 0-1 drink a day. Know how much alcohol is in your drink. In the [place] one drink equals one 12 oz bottle of beer (355 mL), one 5 oz glass of wine (148 mL), or one 1 oz glass of hard liquor (44 mL). Lifestyle Brush your teeth every morning and night with fluoride toothpaste. Floss one time each day. Exercise for at least 30 minutes 5 or more days each week. Do not use any products that contain nicotine or tobacco. These products include cigarettes, chewing tobacco, and vaping devices, such as e-cigarettes. If you need help quitting, ask your health care provider. Do not use drugs. If you are sexually active,  practice safe sex. Use a condom or other form of protection to prevent STIs. If you do not wish to become pregnant, use a form of birth control. If you plan to become pregnant, see your health care provider for a prepregnancy  visit. Find healthy ways to manage stress, such as: Meditation, yoga, or listening to music. Journaling. Talking to a trusted person. Spending time with friends and family. Minimize exposure to UV radiation to reduce your risk of skin cancer. Safety Always wear your seat belt while driving or riding in a vehicle. Do not drive: If you have been drinking alcohol. Do not ride with someone who has been drinking. If you have been using any mind-altering substances or drugs. While texting. When you are tired or distracted. Wear a helmet and other protective equipment during sports activities. If you have firearms in your house, make sure you follow all gun safety procedures. Seek help if you have been physically or sexually abused. What's next? Go to your health care provider once a year for an annual wellness visit. Ask your health care provider how often you should have your eyes and teeth checked. Stay up to date on all vaccines. This information is not intended to replace advice given to you by your health care provider. Make sure you discuss any questions you have with your health care provider. Document Revised: 01/04/2021 Document Reviewed: 01/04/2021 Elsevier Patient Education  [place].

## 2021-09-07 NOTE — Assessment & Plan Note (Addendum)
Controlled.  Continue Lexapro 10 mg daily.  Agree to add propranolol 10 mg to use for test anxiety. Discussed potential side effects.

## 2022-01-02 ENCOUNTER — Other Ambulatory Visit: Payer: Self-pay | Admitting: Obstetrics & Gynecology

## 2022-01-26 ENCOUNTER — Encounter (HOSPITAL_BASED_OUTPATIENT_CLINIC_OR_DEPARTMENT_OTHER): Payer: Self-pay | Admitting: Obstetrics & Gynecology

## 2022-02-01 ENCOUNTER — Ambulatory Visit: Payer: 59 | Admitting: Primary Care

## 2022-02-01 ENCOUNTER — Encounter: Payer: Self-pay | Admitting: Primary Care

## 2022-02-01 VITALS — BP 120/72 | HR 68 | Temp 98.6°F | Ht 64.0 in | Wt 154.0 lb

## 2022-02-01 DIAGNOSIS — R051 Acute cough: Secondary | ICD-10-CM | POA: Diagnosis not present

## 2022-02-01 MED ORDER — AZITHROMYCIN 250 MG PO TABS: ORAL_TABLET | ORAL | 0 refills | Status: DC

## 2022-02-01 NOTE — Progress Notes (Signed)
Subjective:    Patient ID: Casey Clarke, female    DOB: 1980/07/29, 41 y.o.   MRN: [id number]  Cough Associated symptoms include chills and postnasal drip. Pertinent negatives include no fever, rhinorrhea or sore throat.    Casey Clarke is a very pleasant 41 y.o. female who presents today to discuss cough.   Symptom onset two weeks ago with post nasal drip. She then developed cough, chest tightness, headaches, fatigue. Over the last week her fatigue and cough have progressed. She has noticed a few chills.   She's been taking Mucinex, Robitussin, Tessalon Perles without improvement.   She denies fevers, esophageal burning, belching, history of asthma. She tested negative for Covid-19 this week. Her son has been coughing as well, he has improved with use of his inhaler.    Review of Systems  Constitutional:  Positive for chills and fatigue. Negative for fever.  HENT:  Positive for postnasal drip. Negative for congestion, rhinorrhea and sore throat.   Respiratory:  Positive for cough and chest tightness.          Past Medical History:  Diagnosis Date   Abnormal Pap smear of cervix 2014, 03/2014   ASCUS, pos HR HPV, 18/45; 2015 LGSIL, + HR HPV   AMA (advanced maternal age) primigravida 35+    Cesarean delivery delivered - repeat 11/30/2020   Dermoid cyst of ovary, left 03/2016   Fibroid    GERD (gastroesophageal reflux disease)    H/O myomectomy 06/16/2018   Heart murmur    Heterozygous MTHFR mutation C677T    History of gastric ulcer    remote hx - resolved   History of kidney stones    History of palpitations    hypersenitive to codeine and per pt sometimes tachy   History of recurrent miscarriages    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   Infertility, female    Maternal anemia, with delivery - ABL 12/01/2020   Migraine    Nocturia    PONV (postoperative nausea and vomiting)    itching from fentanyl   Postpartum care following cesarean delivery 5/11 06/16/2018   Pregnancy  with history of uterine myomectomy 06/16/2018   Previous cesarean delivery affecting pregnancy 11/30/2020   S/P cesarean section 06/16/2018    Social History   Socioeconomic History   Marital status: Married    Spouse name: Not on file   Number of children: 0   Years of education: Not on file   Highest education level: Not on file  Occupational History   Not on file  Tobacco Use   Smoking status: Never   Smokeless tobacco: Never  Vaping Use   Vaping Use: Never used  Substance and Sexual Activity   Alcohol use: Yes    Alcohol/week: 1.0 - 2.0 standard drink of alcohol    Types: 1 - 2 Glasses of wine per week    Comment: social   Drug use: No   Sexual activity: Yes    Partners: Male    Birth control/protection: None  Other Topics Concern   Not on file  Social History Narrative   Not on file   Social Determinants of Health   Financial Resource Strain: Not on file  Food Insecurity: No Food Insecurity (06/05/2018)   Hunger Vital Sign    Worried About Running Out of Food in the Last Year: Never true    Ran Out of Food in the Last Year: Never true  Transportation Needs: Unmet Transportation Needs (06/05/2018)   PRAPARE -  Hydrologist (Medical): Yes    Lack of Transportation (Non-Medical): Not on file  Physical Activity: Not on file  Stress: No Stress Concern Present (06/05/2018)   [place]    Feeling of Stress : Only a little  Social Connections: Not on file  Intimate Partner Violence: Not At Risk (06/05/2018)   Humiliation, Afraid, Rape, and Kick questionnaire    Fear of Current or Ex-Partner: No    Emotionally Abused: No    Physically Abused: No    Sexually Abused: No    Past Surgical History:  Procedure Laterality Date   AUGMENTATION MAMMAPLASTY Bilateral 01/17/10   implants   CESAREAN SECTION N/A 06/16/2018   Procedure: Primary CESAREAN SECTION;  Surgeon: Brien Few, MD;  Location: [place];  Service: Obstetrics;  Laterality: N/A;  EDD: 07/07/18 Allergy: Codeine   CESAREAN SECTION WITH BILATERAL TUBAL LIGATION N/A 11/30/2020   Procedure: Repeat CESAREAN SECTION;  Surgeon: Brien Few, MD;  Location: [place] [facility];  Service: Obstetrics;  Laterality: N/A;  EDD: 12/20/20   CHROMOPERTUBATION N/A 06/19/2016   Procedure: CHROMOPERTUBATION;  Surgeon: Governor Specking, MD;  Location: [facility];  Service: Gynecology;  Laterality: N/A;   COLPOSCOPY  2014, 2015   2015 LGSIL, neg ECC   DILATION AND EVACUATION  12/30/2007   missed ab   LAPAROSCOPIC GELPORT ASSISTED MYOMECTOMY N/A 06/19/2016   Procedure: LAPAROSCOPIC GELPORT ASSISTED MYOMECTOMY;  Surgeon: Governor Specking, MD;  Location: [place];  Service: Gynecology;  Laterality: N/A;   LAPAROSCOPY     MYOMECTOMY   OVARIAN CYST REMOVAL Left 06/19/2016   Procedure: LEFT OVARIAN CYSTECTOMY;  Surgeon: Governor Specking, MD;  Location: [place];  Service: Gynecology;  Laterality: Left;   TRANSTHORACIC ECHOCARDIOGRAM  01/08/2014   ef 56%/  mild to [employer] TR/  trivial PR    Family History  Problem Relation Age of Onset   Thyroid disease Mother    Hypertension Mother    Hypertension Maternal Grandmother    Thyroid disease Maternal Grandmother    Kidney disease Maternal Grandmother    Heart attack Maternal Grandfather    Cancer Maternal Grandfather        lymph nodes   Cancer Paternal Grandmother        stomach   Heart disease Paternal Grandfather        pace maker   Hypotension Paternal Grandfather    Diabetes Maternal Aunt     Allergies  Allergen Reactions   Codeine Other (See Comments)    "palpitations and heart races" Can take percocet per pt    Current Outpatient Medications on File Prior to Visit  Medication Sig Dispense Refill   Cholecalciferol (VITAMIN D3) 50 MCG (2000 UT) TABS Take 2,000 Units by mouth every evening.      escitalopram (LEXAPRO) 10 MG tablet TAKE 1 TABLET BY MOUTH EVERY DAY FOR ANXIETY 90 tablet 3   folic acid (FOLVITE) 1 MG tablet TAKE 2 TABLETS (2 MG TOTAL) BY MOUTH EVERY EVENING. 60 tablet 0   Probiotic Product (ADVANCED PROBIOTIC PO) Take 1 capsule by mouth every evening.     Zinc 50 MG TABS Take 50 mg by mouth every evening.     No current facility-administered medications on file prior to visit.    BP 120/72   Pulse 68   Temp 98.6 F (37 C) (Oral)   Ht '5\' 4"'$  (1.626 m)   Wt  154 lb (69.9 kg)   SpO2 98%   BMI 26.43 kg/m  Objective:   Physical Exam HENT:     Right Ear: Tympanic membrane and ear canal normal.     Left Ear: Tympanic membrane and ear canal normal.     Nose:     Right Sinus: No maxillary sinus tenderness or frontal sinus tenderness.     Left Sinus: No maxillary sinus tenderness or frontal sinus tenderness.  Eyes:     Conjunctiva/sclera: Conjunctivae normal.  Cardiovascular:     Rate and Rhythm: Normal rate and regular rhythm.  Pulmonary:     Effort: Pulmonary effort is normal.     Breath sounds: Normal breath sounds. No wheezing or rales.     Comments: Dry cough noted a few times during visit Musculoskeletal:     Cervical back: Neck supple.  Lymphadenopathy:     Cervical: Cervical adenopathy present.  Skin:    General: Skin is warm and dry.           Assessment & Plan:   Problem List Items Addressed This Visit       Other   Cough - Primary    Lungs clear, no obvious pneumonia.  Given duration of symptoms, coupled with presentation, will treat for presumed bacterial involvement.  Rx for Zpack sent to pharmacy. Continue Tessalon Perles PRN.  Discussed use of omeprazole 20 mg for potential silent reflux induced cough.  Return precautions provided.       Relevant Medications   azithromycin (ZITHROMAX) 250 MG tablet       Pleas Koch, NP

## 2022-02-01 NOTE — Patient Instructions (Signed)
Start Azithromycin antibiotics for infection. Take 2 tablets by mouth today, then 1 tablet daily for 4 additional days.  Continue with [employer] as needed.  Consider taking omeprazole 20 mg at bedtime if cough persists.   It was a pleasure to see you today!

## 2022-02-01 NOTE — Assessment & Plan Note (Signed)
Lungs clear, no obvious pneumonia.  Given duration of symptoms, coupled with presentation, will treat for presumed bacterial involvement.  Rx for Zpack sent to pharmacy. Continue Tessalon Perles PRN.  Discussed use of omeprazole 20 mg for potential silent reflux induced cough.  Return precautions provided.

## 2022-03-07 ENCOUNTER — Encounter (HOSPITAL_BASED_OUTPATIENT_CLINIC_OR_DEPARTMENT_OTHER): Payer: Self-pay | Admitting: Obstetrics & Gynecology

## 2022-03-07 ENCOUNTER — Ambulatory Visit (INDEPENDENT_AMBULATORY_CARE_PROVIDER_SITE_OTHER): Payer: 59 | Admitting: Obstetrics & Gynecology

## 2022-03-07 ENCOUNTER — Other Ambulatory Visit (HOSPITAL_COMMUNITY)
Admission: RE | Admit: 2022-03-07 | Discharge: 2022-03-07 | Disposition: A | Discharge status: Home / Self Care | Payer: 59 | Source: Ambulatory Visit | Attending: Obstetrics & Gynecology | Admitting: Obstetrics & Gynecology

## 2022-03-07 ENCOUNTER — Other Ambulatory Visit (HOSPITAL_BASED_OUTPATIENT_CLINIC_OR_DEPARTMENT_OTHER): Payer: 59

## 2022-03-07 VITALS — BP 138/89 | HR 81 | Ht 64.5 in | Wt 152.8 lb

## 2022-03-07 DIAGNOSIS — Z1231 Encounter for screening mammogram for malignant neoplasm of breast: Secondary | ICD-10-CM | POA: Diagnosis not present

## 2022-03-07 DIAGNOSIS — N920 Excessive and frequent menstruation with regular cycle: Secondary | ICD-10-CM | POA: Diagnosis not present

## 2022-03-07 DIAGNOSIS — Z01419 Encounter for gynecological examination (general) (routine) without abnormal findings: Secondary | ICD-10-CM

## 2022-03-07 DIAGNOSIS — Z86018 Personal history of other benign neoplasm: Secondary | ICD-10-CM

## 2022-03-07 DIAGNOSIS — Z124 Encounter for screening for malignant neoplasm of cervix: Secondary | ICD-10-CM | POA: Diagnosis present

## 2022-03-07 DIAGNOSIS — Z9889 Other specified postprocedural states: Secondary | ICD-10-CM | POA: Insufficient documentation

## 2022-03-07 HISTORY — DX: Personal history of other benign neoplasm: Z86.018

## 2022-03-07 MED ORDER — TRANEXAMIC ACID 650 MG PO TABS: 1300.0000 mg | ORAL_TABLET | Freq: Three times a day (TID) | ORAL | 2 refills | Status: DC

## 2022-03-07 NOTE — Progress Notes (Signed)
41 y.o. [id number] Married White or Caucasian female here for annual exam.  Had delivery in 2022.  C section with BTL.  She still has heavy bleeding for the first two days of her cycle.  Discussed lysteda with pt.  H/o MTHFR heterozygous.  No hx of DVT.  Never on blood thinners.    Pt requests yearly pap smear.  Had negative pap/HR HPV 2021.    Patient's last menstrual period was 02/08/2022.          Sexually active: Yes.    The current method of family planning is tubal ligation.    Smoker:  no  Health Maintenance: Pap:  02/26/20 neg History of abnormal Pap:  h/o LGSIL MMG:  order placed Colonoscopy:  guidelines reviewed Screening Labs: done 08/2021   reports that she has never smoked. She has never used smokeless tobacco. She reports current alcohol use of about 1.0 - 2.0 standard drink of alcohol per week. She reports that she does not use drugs.  Past Medical History:  Diagnosis Date   Abnormal Pap smear of cervix 2014, 03/2014   ASCUS, pos HR HPV, 18/45; 2015 LGSIL, + HR HPV   AMA (advanced maternal age) primigravida 35+    Cesarean delivery delivered - repeat 11/30/2020   Dermoid cyst of ovary, left 03/2016   Fibroid    GERD (gastroesophageal reflux disease)    H/O myomectomy 06/16/2018   Heart murmur    Heterozygous MTHFR mutation C677T    History of gastric ulcer    remote hx - resolved   History of kidney stones    History of palpitations    hypersenitive to codeine and per pt sometimes tachy   History of recurrent miscarriages    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   Infertility, female    Maternal anemia, with delivery - ABL 12/01/2020   Migraine    Nocturia    PONV (postoperative nausea and vomiting)    itching from fentanyl   Postpartum care following cesarean delivery 5/11 06/16/2018   Pregnancy with history of uterine myomectomy 06/16/2018   Previous cesarean delivery affecting pregnancy 11/30/2020   S/P cesarean section 06/16/2018    Past Surgical History:  Procedure  Laterality Date   AUGMENTATION MAMMAPLASTY Bilateral 01/17/10   implants   CESAREAN SECTION N/A 06/16/2018   Procedure: Primary CESAREAN SECTION;  Surgeon: Brien Few, MD;  Location: [facility];  Service: Obstetrics;  Laterality: N/A;  EDD: 07/07/18 Allergy: Codeine   CESAREAN SECTION WITH BILATERAL TUBAL LIGATION N/A 11/30/2020   Procedure: Repeat CESAREAN SECTION;  Surgeon: Brien Few, MD;  Location: [facility];  Service: Obstetrics;  Laterality: N/A;  EDD: 12/20/20   CHROMOPERTUBATION N/A 06/19/2016   Procedure: CHROMOPERTUBATION;  Surgeon: Governor Specking, MD;  Location: [facility];  Service: Gynecology;  Laterality: N/A;   COLPOSCOPY  2014, 2015   2015 LGSIL, neg ECC   DILATION AND EVACUATION  12/30/2007   missed ab   LAPAROSCOPIC GELPORT ASSISTED MYOMECTOMY N/A 06/19/2016   Procedure: LAPAROSCOPIC GELPORT ASSISTED MYOMECTOMY;  Surgeon: Governor Specking, MD;  Location: [facility];  Service: Gynecology;  Laterality: N/A;   LAPAROSCOPY     MYOMECTOMY   OVARIAN CYST REMOVAL Left 06/19/2016   Procedure: LEFT OVARIAN CYSTECTOMY;  Surgeon: Governor Specking, MD;  Location: [facility];  Service: Gynecology;  Laterality: Left;   TRANSTHORACIC ECHOCARDIOGRAM  01/08/2014   ef 56%/  mild to moderater TR/  trivial PR    Current Outpatient Medications  Medication Sig Dispense Refill   Cholecalciferol (VITAMIN D3) 50 MCG (2000 UT) TABS Take 2,000 Units by mouth every evening.     escitalopram (LEXAPRO) 10 MG tablet TAKE 1 TABLET BY MOUTH EVERY DAY FOR ANXIETY 90 tablet 3   folic acid (FOLVITE) 1 MG tablet TAKE 2 TABLETS (2 MG TOTAL) BY MOUTH EVERY EVENING. 60 tablet 0   Probiotic Product (ADVANCED PROBIOTIC PO) Take 1 capsule by mouth every evening.     tranexamic acid (LYSTEDA) 650 MG TABS tablet Take 2 tablets (1,300 mg total) by mouth 3 (three) times daily. 30 tablet 2   Zinc 50 MG TABS Take 50 mg by mouth every evening.     No  current facility-administered medications for this visit.    Family History  Problem Relation Age of Onset   Thyroid disease Mother    Hypertension Mother    Hypertension Maternal Grandmother    Thyroid disease Maternal Grandmother    Kidney disease Maternal Grandmother    Heart attack Maternal Grandfather    Cancer Maternal Grandfather        lymph nodes   Cancer Paternal Grandmother        stomach   Heart disease Paternal Grandfather        pace maker   Hypotension Paternal Grandfather    Diabetes Maternal Aunt     ROS: Constitutional: negative Genitourinary:negative  Exam:   BP 138/89   Pulse 81   Ht 5' 4.5" (1.638 m)   Wt 152 lb 12.8 oz (69.3 kg)   LMP 02/08/2022   Breastfeeding No   BMI 25.82 kg/m   Height: 5' 4.5" (163.8 cm)  General appearance: alert, cooperative and appears stated age Head: Normocephalic, without obvious abnormality, atraumatic Neck: no adenopathy, supple, symmetrical, trachea midline and thyroid normal to inspection and palpation Lungs: clear to auscultation bilaterally Breasts: normal appearance, no masses or tenderness, bilateral implants present Heart: regular rate and rhythm Abdomen: soft, non-tender; bowel sounds normal; no masses,  no organomegaly Extremities: extremities normal, atraumatic, no cyanosis or edema Skin: Skin color, texture, turgor normal. No rashes or lesions Lymph nodes: Cervical, supraclavicular, and axillary nodes normal. No abnormal inguinal nodes palpated Neurologic: Grossly normal   Pelvic: External genitalia:  no lesions              Urethra:  normal appearing urethra with no masses, tenderness or lesions              Bartholins and Skenes: normal                 Vagina: normal appearing vagina with normal color and no discharge, no lesions              Cervix: no lesions              Pap taken: Yes.   Bimanual Exam:  Uterus:  normal size, contour, position, consistency, mobility, non-tender               Adnexa: normal adnexa and no mass, fullness, tenderness               Rectovaginal: Confirms               Anus:  normal sphincter tone, no lesions  Chaperone, Octaviano Batty, CMA, was present for exam.  Assessment/Plan: 1. Well woman exam with routine gynecological exam - Pap smear obtained today.  Neg HR HPV 2021. Pt requested pap today. - Mammogram ordered for pt to start -  Colonoscopy guidelines reviewed - lab work done done with PCP - vaccines reviewed/updated  2. Menorrhagia with regular cycle - tranexamic acid (LYSTEDA) 650 MG TABS tablet; Take 2 tablets (1,300 mg total) by mouth 3 (three) times daily.  Dispense: 30 tablet; Refill: 2 - US PELVIC COMPLETE WITH TRANSVAGINAL; Future  3. Cervical cancer screening - Cytology - PAP( [place])  4. Encounter for screening mammogram for malignant neoplasm of breast - MM 3D SCREEN BREAST BILATERAL; Future  5. History of dermoid cyst excision  6. History of myomectomy

## 2022-03-08 ENCOUNTER — Ambulatory Visit (HOSPITAL_BASED_OUTPATIENT_CLINIC_OR_DEPARTMENT_OTHER)
Admission: RE | Admit: 2022-03-08 | Discharge: 2022-03-08 | Disposition: A | Discharge status: Home / Self Care | Payer: 59 | Source: Ambulatory Visit | Attending: Obstetrics & Gynecology | Admitting: Obstetrics & Gynecology

## 2022-03-08 DIAGNOSIS — N920 Excessive and frequent menstruation with regular cycle: Secondary | ICD-10-CM | POA: Insufficient documentation

## 2022-03-08 LAB — CYTOLOGY - PAP: Diagnosis: NEGATIVE

## 2022-03-16 ENCOUNTER — Ambulatory Visit (HOSPITAL_BASED_OUTPATIENT_CLINIC_OR_DEPARTMENT_OTHER)
Admission: RE | Admit: 2022-03-16 | Discharge: 2022-03-16 | Disposition: A | Discharge status: Home / Self Care | Payer: 59 | Source: Ambulatory Visit | Attending: Obstetrics & Gynecology | Admitting: Obstetrics & Gynecology

## 2022-03-16 DIAGNOSIS — Z1231 Encounter for screening mammogram for malignant neoplasm of breast: Secondary | ICD-10-CM | POA: Insufficient documentation

## 2022-03-19 ENCOUNTER — Encounter (HOSPITAL_BASED_OUTPATIENT_CLINIC_OR_DEPARTMENT_OTHER): Payer: Self-pay | Admitting: Obstetrics & Gynecology

## 2022-04-12 ENCOUNTER — Other Ambulatory Visit: Payer: Self-pay | Admitting: Primary Care

## 2022-04-12 DIAGNOSIS — F411 Generalized anxiety disorder: Secondary | ICD-10-CM

## 2022-05-09 ENCOUNTER — Ambulatory Visit: Payer: 59 | Admitting: Primary Care

## 2022-05-09 ENCOUNTER — Encounter: Payer: Self-pay | Admitting: Primary Care

## 2022-05-09 VITALS — BP 144/86 | HR 85 | Temp 97.2°F | Ht 64.5 in | Wt 155.0 lb

## 2022-05-09 DIAGNOSIS — Z111 Encounter for screening for respiratory tuberculosis: Secondary | ICD-10-CM | POA: Diagnosis not present

## 2022-05-09 DIAGNOSIS — F411 Generalized anxiety disorder: Secondary | ICD-10-CM

## 2022-05-09 MED ORDER — ESCITALOPRAM OXALATE 20 MG PO TABS: 20.0000 mg | ORAL_TABLET | Freq: Every day | ORAL | 0 refills | Status: DC

## 2022-05-09 NOTE — Assessment & Plan Note (Signed)
Deteriorated.   Increase Lexpro to 20 mg daily. She will update.

## 2022-05-09 NOTE — Patient Instructions (Signed)
We increased your Lexapro to 20 mg daily for anxiety.  Stop by the lab prior to leaving today. I will notify you of your results once received.   Monitor your blood pressure at home or work. Notify me if you see readings consistently at or above 135/90.  It was a pleasure to see you today!

## 2022-05-09 NOTE — Progress Notes (Signed)
Subjective:    Patient ID: Casey Clarke, female    DOB: July 31, 1980, 41 y.o.   MRN: [id number]  HPI  Casey Clarke is a very pleasant 41 y.o. female with a history of GAD who presents today needing TB testing and to discuss anxiety.   Currently managed on Lexapro 10 mg for anxiety and historically has done well. Over the last several months she's noticed increased irritability, increased anxiety, increased worrying while trying to find a job. 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   She will be starting at the [facility] through [facility] in January 2024. She is needing a TB blood draw and also a copy of her vaccinations.   BP Readings from Last 3 Encounters:  05/09/22 (!) 144/86  03/07/22 138/89  02/01/22 120/72     Review of Systems  Respiratory:  Negative for shortness of breath.   Cardiovascular:  Negative for chest pain.  Neurological:  Negative for headaches.  Psychiatric/Behavioral:  The patient is nervous/anxious.          Past Medical History:  Diagnosis Date   Abnormal Pap smear of cervix 2014, 03/2014   ASCUS, pos HR HPV, 18/45; 2015 LGSIL, + HR HPV   AMA (advanced maternal age) primigravida 35+    Cesarean delivery delivered - repeat 11/30/2020   Dermoid cyst of ovary, left 03/2016   Fibroid    GERD (gastroesophageal reflux disease)    H/O myomectomy 06/16/2018   Heart murmur    Heterozygous MTHFR mutation C677T    History of gastric ulcer    remote hx - resolved   History of kidney stones    History of palpitations    hypersenitive to codeine and per pt sometimes tachy   History of recurrent miscarriages    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   Infertility, female    Maternal anemia, with delivery - ABL 12/01/2020   Migraine    Nocturia    PONV (postoperative nausea and vomiting)    itching from fentanyl   Postpartum care following cesarean delivery 5/11 06/16/2018   Pregnancy with history of  uterine myomectomy 06/16/2018   Previous cesarean delivery affecting pregnancy 11/30/2020   S/P cesarean section 06/16/2018    Social History   Socioeconomic History   Marital status: Married    Spouse name: Not on file   Number of children: 0   Years of education: Not on file   Highest education level: Not on file  Occupational History   Not on file  Tobacco Use   Smoking status: Never   Smokeless tobacco: Never  Vaping Use   Vaping Use: Never used  Substance and Sexual Activity   Alcohol use: Yes    Alcohol/week: 1.0 - 2.0 standard drink of alcohol    Types: 1 - 2 Glasses of wine per week    Comment: social   Drug use: No   Sexual activity: Yes    Partners: Male    Birth control/protection: Surgical    Comment: BTL  Other Topics Concern   Not on file  Social History Narrative   Not on file   Social Determinants of Health   Financial Resource Strain: Not on file  Food Insecurity: No Food Insecurity (06/05/2018)   Hunger Vital Sign    Worried About Running Out of Food in the Last Year: Never true    Ran Out of Food in the Last Year: Never true  Transportation  Needs: Unmet Transportation Needs (06/05/2018)   PRAPARE - Hydrologist (Medical): Yes    Lack of Transportation (Non-Medical): Not on file  Physical Activity: Not on file  Stress: No Stress Concern Present (06/05/2018)   [place]    Feeling of Stress : Only a little  Social Connections: Not on file  Intimate Partner Violence: Not At Risk (06/05/2018)   Humiliation, Afraid, Rape, and Kick questionnaire    Fear of Current or Ex-Partner: No    Emotionally Abused: No    Physically Abused: No    Sexually Abused: No    Past Surgical History:  Procedure Laterality Date   AUGMENTATION MAMMAPLASTY Bilateral 01/17/10   implants   CESAREAN SECTION N/A 06/16/2018   Procedure: Primary CESAREAN SECTION;  Surgeon:  Brien Few, MD;  Location: [facility];  Service: Obstetrics;  Laterality: N/A;  EDD: 07/07/18 Allergy: Codeine   CESAREAN SECTION WITH BILATERAL TUBAL LIGATION N/A 11/30/2020   Procedure: Repeat CESAREAN SECTION;  Surgeon: Brien Few, MD;  Location: [facility];  Service: Obstetrics;  Laterality: N/A;  EDD: 12/20/20   CHROMOPERTUBATION N/A 06/19/2016   Procedure: CHROMOPERTUBATION;  Surgeon: Governor Specking, MD;  Location: [facility];  Service: Gynecology;  Laterality: N/A;   COLPOSCOPY  2014, 2015   2015 LGSIL, neg ECC   DILATION AND EVACUATION  12/30/2007   missed ab   LAPAROSCOPIC GELPORT ASSISTED MYOMECTOMY N/A 06/19/2016   Procedure: LAPAROSCOPIC GELPORT ASSISTED MYOMECTOMY;  Surgeon: Governor Specking, MD;  Location: [facility];  Service: Gynecology;  Laterality: N/A;   LAPAROSCOPY     MYOMECTOMY   OVARIAN CYST REMOVAL Left 06/19/2016   Procedure: LEFT OVARIAN CYSTECTOMY;  Surgeon: Governor Specking, MD;  Location: [facility];  Service: Gynecology;  Laterality: Left;   TRANSTHORACIC ECHOCARDIOGRAM  01/08/2014   ef 56%/  mild to [employer] TR/  trivial PR    Family History  Problem Relation Age of Onset   Thyroid disease Mother    Hypertension Mother    Hypertension Maternal Grandmother    Thyroid disease Maternal Grandmother    Kidney disease Maternal Grandmother    Heart attack Maternal Grandfather    Cancer Maternal Grandfather        lymph nodes   Cancer Paternal Grandmother        stomach   Heart disease Paternal Grandfather        pace maker   Hypotension Paternal Grandfather    Diabetes Maternal Aunt     Allergies  Allergen Reactions   Codeine Other (See Comments)    "palpitations and heart races" Can take percocet per pt    Current Outpatient Medications on File Prior to Visit  Medication Sig Dispense Refill   Cholecalciferol (VITAMIN D3) 50 MCG (2000 UT) TABS Take 2,000 Units by mouth every  evening.     folic acid (FOLVITE) 1 MG tablet TAKE 2 TABLETS (2 MG TOTAL) BY MOUTH EVERY EVENING. 60 tablet 0   Probiotic Product (ADVANCED PROBIOTIC PO) Take 1 capsule by mouth every evening.     Zinc 50 MG TABS Take 50 mg by mouth every evening.     tranexamic acid (LYSTEDA) 650 MG TABS tablet Take 2 tablets (1,300 mg total) by mouth 3 (three) times daily. 30 tablet 2   No current facility-administered medications on file prior to visit.    BP (!) 144/86   Pulse 85   Temp (!)  97.2 F (36.2 C) (Temporal)   Ht 5' 4.5" (1.638 m)   Wt 155 lb (70.3 kg)   SpO2 98%   BMI 26.19 kg/m  Objective:   Physical Exam Cardiovascular:     Rate and Rhythm: Normal rate and regular rhythm.  Pulmonary:     Effort: Pulmonary effort is normal.     Breath sounds: Normal breath sounds.  Musculoskeletal:     Cervical back: Neck supple.  Skin:    General: Skin is warm and dry.           Assessment & Plan:   Problem List Items Addressed This Visit       Other   GAD (generalized anxiety disorder) - Primary    Deteriorated.   Increase Lexpro to 20 mg daily. She will update.        Relevant Medications   escitalopram (LEXAPRO) 20 MG tablet   Other Visit Diagnoses     Screening for tuberculosis       Relevant Orders   QuantiFERON-TB Gold Plus          Pleas Koch, NP

## 2022-05-15 LAB — QUANTIFERON-TB GOLD PLUS
Mitogen-NIL: >10 IU/mL
NIL: 0.13 IU/mL
QuantiFERON-TB Gold Plus: NEGATIVE
TB1-NIL: 0.02 IU/mL
TB2-NIL: 0.05 IU/mL

## 2022-07-24 DIAGNOSIS — F411 Generalized anxiety disorder: Secondary | ICD-10-CM

## 2022-07-24 MED ORDER — PROPRANOLOL HCL 10 MG PO TABS: 10.0000 mg | ORAL_TABLET | Freq: Two times a day (BID) | ORAL | 0 refills | Status: DC | PRN

## 2022-07-24 NOTE — Telephone Encounter (Signed)
Casey Clarke, can you email her a copy of her vaccines? See mychart message.

## 2022-07-24 NOTE — Telephone Encounter (Signed)
I have updated her record to reflect influenza vaccine received 07/21/22.

## 2022-07-24 NOTE — Telephone Encounter (Signed)
Called patient and walked her through how to print off immunizations from her mychart app on her phone. She was appreciative and stated that is all she needed.

## 2022-08-04 ENCOUNTER — Other Ambulatory Visit: Payer: Self-pay | Admitting: Primary Care

## 2022-08-04 DIAGNOSIS — F411 Generalized anxiety disorder: Secondary | ICD-10-CM

## 2022-09-12 ENCOUNTER — Telehealth: Payer: 59 | Admitting: Physician Assistant

## 2022-09-12 DIAGNOSIS — B9689 Other specified bacterial agents as the cause of diseases classified elsewhere: Secondary | ICD-10-CM | POA: Diagnosis not present

## 2022-09-12 DIAGNOSIS — J019 Acute sinusitis, unspecified: Secondary | ICD-10-CM

## 2022-09-12 MED ORDER — AMOXICILLIN-POT CLAVULANATE 875-125 MG PO TABS: 1.0000 | ORAL_TABLET | Freq: Two times a day (BID) | ORAL | 0 refills | Status: DC

## 2022-09-12 NOTE — Progress Notes (Signed)

## 2022-12-11 DIAGNOSIS — B9689 Other specified bacterial agents as the cause of diseases classified elsewhere: Secondary | ICD-10-CM

## 2022-12-12 MED ORDER — AMOXICILLIN-POT CLAVULANATE 875-125 MG PO TABS: 1.0000 | ORAL_TABLET | Freq: Two times a day (BID) | ORAL | 0 refills | Status: DC

## 2023-03-07 NOTE — Telephone Encounter (Signed)
Can we get her scheduled for CPE at 3:40 pm on 03/13/23?

## 2023-03-07 NOTE — Telephone Encounter (Signed)
Patient has been scheduled

## 2023-03-13 ENCOUNTER — Ambulatory Visit (INDEPENDENT_AMBULATORY_CARE_PROVIDER_SITE_OTHER): Payer: 59 | Admitting: Primary Care

## 2023-03-13 VITALS — BP 110/62 | HR 62 | Temp 98.6°F | Ht 64.5 in | Wt 159.0 lb

## 2023-03-13 DIAGNOSIS — F411 Generalized anxiety disorder: Secondary | ICD-10-CM | POA: Diagnosis not present

## 2023-03-13 DIAGNOSIS — Z0000 Encounter for general adult medical examination without abnormal findings: Secondary | ICD-10-CM | POA: Diagnosis not present

## 2023-03-13 DIAGNOSIS — Z1231 Encounter for screening mammogram for malignant neoplasm of breast: Secondary | ICD-10-CM | POA: Diagnosis not present

## 2023-03-13 MED ORDER — BUSPIRONE HCL 5 MG PO TABS: 5.0000 mg | ORAL_TABLET | Freq: Two times a day (BID) | ORAL | 0 refills | Status: DC

## 2023-03-13 NOTE — Patient Instructions (Signed)
Stop by the lab prior to leaving today. I will notify you of your results once received.   Start buspirone 5 mg tablets for anxiety.  Take 1 tablet by mouth once daily.  Okay to increase to twice daily after 1 week if needed.  [facility].   It was a pleasure to see you today!

## 2023-03-13 NOTE — Assessment & Plan Note (Signed)
 Immunizations UTD. Pap smear UTD.  Follows with GYN Mammogram due, orders placed.  Discussed the importance of a healthy diet and regular exercise in order for weight loss, and to reduce the risk of further co-morbidity.  Exam stable. Labs pending.  Follow up in 1 year for repeat physical.

## 2023-03-13 NOTE — Progress Notes (Signed)
Subjective:    Patient ID: Casey Clarke, female    DOB: 10-29-1980, 42 y.o.   MRN: [id number]  HPI  Casey Clarke is a very pleasant 42 y.o. female who presents today for complete physical and follow up of chronic conditions.  Over the last 9 months she's experienced increased anxiety with her husband's cancer diagnosis, taking care of her children.   Immunizations: -Tetanus: Completed in 2022  Diet: Fair diet.  Exercise: No regular exercise.  Eye exam: Completes annually  Dental exam: Completes semi-annually    Pap Smear: Follows with GYN Mammogram: Completed in August 2023  BP Readings from Last 3 Encounters:  03/13/23 110/62  05/09/22 (!) 144/86  03/07/22 138/89      Review of Systems  Constitutional:  Negative for unexpected weight change.  HENT:  Negative for rhinorrhea.   Respiratory:  Negative for cough and shortness of breath.   Cardiovascular:  Negative for chest pain.  Gastrointestinal:  Negative for constipation and diarrhea.  Genitourinary:  Negative for difficulty urinating.  Musculoskeletal:  Negative for arthralgias and myalgias.  Skin:  Negative for rash.  Allergic/Immunologic: Negative for environmental allergies.  Neurological:  Negative for dizziness and headaches.  Psychiatric/Behavioral:  The patient is nervous/anxious.          Past Medical History:  Diagnosis Date   Abnormal Pap smear of cervix 2014, 03/2014   ASCUS, pos HR HPV, 18/45; 2015 LGSIL, + HR HPV   AMA (advanced maternal age) primigravida 35+    Cesarean delivery delivered - repeat 11/30/2020   Dermoid cyst of ovary, left 03/2016   Fibroid    GERD (gastroesophageal reflux disease)    H/O myomectomy 06/16/2018   Heart murmur    Heterozygous MTHFR mutation C677T    History of gastric ulcer    remote hx - resolved   History of kidney stones    History of palpitations    hypersenitive to codeine and per pt sometimes tachy   History of recurrent miscarriages    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   Infertility, female    Maternal anemia, with delivery - ABL 12/01/2020   Migraine    Nocturia    PONV (postoperative nausea and vomiting)    itching from fentanyl   Postpartum care following cesarean delivery 5/11 06/16/2018   Pregnancy with history of uterine myomectomy 06/16/2018   Previous cesarean delivery affecting pregnancy 11/30/2020   S/P cesarean section 06/16/2018    Social History   Socioeconomic History   Marital status: Married    Spouse name: Not on file   Number of children: 0   Years of education: Not on file   Highest education level: Not on file  Occupational History   Not on file  Tobacco Use   Smoking status: Never   Smokeless tobacco: Never  Vaping Use   Vaping status: Never Used  Substance and Sexual Activity   Alcohol use: Yes    Alcohol/week: 1.0 - 2.0 standard drink of alcohol    Types: 1 - 2 Glasses of wine per week    Comment: social   Drug use: No   Sexual activity: Yes    Partners: Male    Birth control/protection: Surgical    Comment: BTL  Other Topics Concern   Not on file  Social History Narrative   Not on file   Social Determinants of Health   Financial Resource Strain: Not on file  Food Insecurity: No Food Insecurity (06/05/2018)   Hunger Vital  Sign    Worried About Programme researcher, broadcasting/film/video in the Last Year: Never true    Ran Out of Food in the Last Year: Never true  Transportation Needs: Unmet Transportation Needs (06/05/2018)   PRAPARE - Administrator, Civil Service (Medical): Yes    Lack of Transportation (Non-Medical): Not on file  Physical Activity: Not on file  Stress: No Stress Concern Present (06/05/2018)   [employer] of Occupational Health - Occupational Stress Questionnaire    Feeling of Stress : Only a little  Social Connections: Not on file  Intimate Partner Violence: Not At Risk (06/05/2018)   Humiliation, Afraid, Rape, and Kick questionnaire    Fear of Current or Ex-Partner: No     Emotionally Abused: No    Physically Abused: No    Sexually Abused: No    Past Surgical History:  Procedure Laterality Date   AUGMENTATION MAMMAPLASTY Bilateral 01/17/10   implants   CESAREAN SECTION N/A 06/16/2018   Procedure: Primary CESAREAN SECTION;  Surgeon: Olivia Mackie, MD;  [facility];  Service: Obstetrics;  Laterality: N/A;  EDD: 07/07/18 Allergy: Codeine   CESAREAN SECTION WITH BILATERAL TUBAL LIGATION N/A 11/30/2020   Procedure: Repeat CESAREAN SECTION;  Surgeon: Olivia Mackie, MD;  Location: [facility];  Service: Obstetrics;  Laterality: N/A;  EDD: 12/20/20   CHROMOPERTUBATION N/A 06/19/2016   Procedure: CHROMOPERTUBATION;  Surgeon: Fermin Schwab, MD;  Location: [facility];  Service: Gynecology;  Laterality: N/A;   COLPOSCOPY  2014, 2015   2015 LGSIL, neg ECC   DILATION AND EVACUATION  12/30/2007   missed ab   LAPAROSCOPIC GELPORT ASSISTED MYOMECTOMY N/A 06/19/2016   Procedure: LAPAROSCOPIC GELPORT ASSISTED MYOMECTOMY;  Surgeon: Fermin Schwab, MD;  Location: [facility];  Service: Gynecology;  Laterality: N/A;   LAPAROSCOPY     MYOMECTOMY   OVARIAN CYST REMOVAL Left 06/19/2016   Procedure: LEFT OVARIAN CYSTECTOMY;  Surgeon: Fermin Schwab, MD;  Location: [facility];  Service: Gynecology;  Laterality: Left;   TRANSTHORACIC ECHOCARDIOGRAM  01/08/2014   ef 56%/  mild to [employer] TR/  trivial PR    Family History  Problem Relation Age of Onset   Thyroid disease Mother    Hypertension Mother    Hypertension Maternal Grandmother    Thyroid disease Maternal Grandmother    Kidney disease Maternal Grandmother    Heart attack Maternal Grandfather    Cancer Maternal Grandfather        lymph nodes   Cancer Paternal Grandmother        stomach   Heart disease Paternal Grandfather        pace maker   Hypotension Paternal Grandfather    Diabetes Maternal Aunt     Allergies  Allergen Reactions    Codeine Other (See Comments) and Palpitations    "palpitations and heart races"  Can take percocet per pt  "palpitations and heart races", Can take percocet per pt    Current Outpatient Medications on File Prior to Visit  Medication Sig Dispense Refill   Cholecalciferol (VITAMIN D3) 50 MCG (2000 UT) TABS Take 2,000 Units by mouth every evening.     escitalopram (LEXAPRO) 20 MG tablet TAKE 1 TABLET (20 MG TOTAL) BY MOUTH DAILY. FOR ANXIETY 90 tablet 2   folic acid (FOLVITE) 1 MG tablet TAKE 2 TABLETS (2 MG TOTAL) BY MOUTH EVERY EVENING. 60 tablet 0   Probiotic Product (ADVANCED PROBIOTIC PO) Take 1 capsule by mouth every evening.  propranolol (INDERAL) 10 MG tablet Take 1-2 tablets (10-20 mg total) by mouth 2 (two) times daily as needed (anxiety). 60 tablet 0   Zinc 50 MG TABS Take 50 mg by mouth every evening.     No current facility-administered medications on file prior to visit.    BP 110/62   Pulse 62   Temp 98.6 F (37 C) (Temporal)   Ht 5' 4.5" (1.638 m)   Wt 159 lb (72.1 kg)   LMP 02/13/2023   SpO2 97%   BMI 26.87 kg/m  Objective:   Physical Exam HENT:     Right Ear: Tympanic membrane and ear canal normal.     Left Ear: Tympanic membrane and ear canal normal.     Nose: Nose normal.  Eyes:     Conjunctiva/sclera: Conjunctivae normal.     Pupils: Pupils are equal, round, and reactive to light.  Neck:     Thyroid: No thyromegaly.  Cardiovascular:     Rate and Rhythm: Normal rate and regular rhythm.     Heart sounds: No murmur heard. Pulmonary:     Effort: Pulmonary effort is normal.     Breath sounds: Normal breath sounds. No rales.  Abdominal:     General: Bowel sounds are normal.     Palpations: Abdomen is soft.     Tenderness: There is no abdominal tenderness.  Musculoskeletal:        General: Normal range of motion.     Cervical back: Neck supple.  Lymphadenopathy:     Cervical: No cervical adenopathy.  Skin:    General: Skin is warm and dry.      Findings: No rash.  Neurological:     Mental Status: She is alert and oriented to person, place, and time.     Cranial Nerves: No cranial nerve deficit.     Deep Tendon Reflexes: Reflexes are normal and symmetric.  Psychiatric:        Mood and Affect: Mood normal.           Assessment & Plan:  Preventative health care Assessment & Plan: Immunizations UTD. Pap smear UTD. Follows with GYN Mammogram due, orders placed.  Discussed the importance of a healthy diet and regular exercise in order for weight loss, and to reduce the risk of further co-morbidity.  Exam stable. Labs pending.  Follow up in 1 year for repeat physical.   Orders: -     Lipid panel -     Hemoglobin A1c -     Comprehensive metabolic panel -     CBC  Screening mammogram for breast cancer -     3D Screening Mammogram, Left and Right; Future  GAD (generalized anxiety disorder) Assessment & Plan: Deteriorated given personal stressors.  Continue Lexapro 20 mg daily as this has been effective overall. Add buspirone 5 mg daily to twice daily.  Discussed instructions for use.  She will update.  Orders: -     busPIRone HCl; Take 1 tablet (5 mg total) by mouth 2 (two) times daily. For anxiety  Dispense: 180 tablet; Refill: 0        Doreene Nest, NP

## 2023-03-13 NOTE — Assessment & Plan Note (Signed)
Deteriorated given personal stressors.  Continue Lexapro 20 mg daily as this has been effective overall. Add buspirone 5 mg daily to twice daily.  Discussed instructions for use.  She will update.

## 2023-03-14 ENCOUNTER — Ambulatory Visit (INDEPENDENT_AMBULATORY_CARE_PROVIDER_SITE_OTHER): Payer: 59 | Admitting: Obstetrics & Gynecology

## 2023-03-14 ENCOUNTER — Encounter (HOSPITAL_BASED_OUTPATIENT_CLINIC_OR_DEPARTMENT_OTHER): Payer: Self-pay | Admitting: Obstetrics & Gynecology

## 2023-03-14 ENCOUNTER — Other Ambulatory Visit (HOSPITAL_COMMUNITY)
Admission: RE | Admit: 2023-03-14 | Discharge: 2023-03-14 | Disposition: A | Discharge status: Home / Self Care | Payer: 59 | Source: Ambulatory Visit | Attending: Obstetrics & Gynecology | Admitting: Obstetrics & Gynecology

## 2023-03-14 VITALS — BP 127/67 | HR 73 | Ht 64.0 in | Wt 157.2 lb

## 2023-03-14 DIAGNOSIS — F411 Generalized anxiety disorder: Secondary | ICD-10-CM

## 2023-03-14 DIAGNOSIS — Z124 Encounter for screening for malignant neoplasm of cervix: Secondary | ICD-10-CM | POA: Insufficient documentation

## 2023-03-14 DIAGNOSIS — Z1589 Genetic susceptibility to other disease: Secondary | ICD-10-CM

## 2023-03-14 DIAGNOSIS — N920 Excessive and frequent menstruation with regular cycle: Secondary | ICD-10-CM

## 2023-03-14 DIAGNOSIS — Z9889 Other specified postprocedural states: Secondary | ICD-10-CM | POA: Diagnosis not present

## 2023-03-14 DIAGNOSIS — R002 Palpitations: Secondary | ICD-10-CM

## 2023-03-14 DIAGNOSIS — Z01419 Encounter for gynecological examination (general) (routine) without abnormal findings: Secondary | ICD-10-CM

## 2023-03-14 DIAGNOSIS — Z86018 Personal history of other benign neoplasm: Secondary | ICD-10-CM

## 2023-03-14 LAB — COMPREHENSIVE METABOLIC PANEL
ALT: 36 U/L — ABNORMAL HIGH (ref 0–35)
AST: 21 U/L (ref 0–37)
Albumin: 4.2 g/dL (ref 3.5–5.2)
Alkaline Phosphatase: 36 U/L — ABNORMAL LOW (ref 39–117)
BUN: 17 mg/dL (ref 6–23)
CO2: 29 meq/L (ref 19–32)
Calcium: 9.7 mg/dL (ref 8.4–10.5)
Chloride: 98 meq/L (ref 96–112)
Creatinine, Ser: 0.78 mg/dL (ref 0.40–1.20)
GFR: 94.48 mL/min (ref >60.00)
Glucose, Bld: 91 mg/dL (ref 70–99)
Potassium: 3.8 meq/L (ref 3.5–5.1)
Sodium: 134 meq/L — ABNORMAL LOW (ref 135–145)
Total Bilirubin: 0.4 mg/dL (ref 0.2–1.2)
Total Protein: 7.1 g/dL (ref 6.0–8.3)

## 2023-03-14 LAB — LIPID PANEL
Cholesterol: 199 mg/dL (ref 0–200)
HDL: 33.8 mg/dL — ABNORMAL LOW (ref >39.00)
NonHDL: 165.05
Total CHOL/HDL Ratio: 6
Triglycerides: 346 mg/dL — ABNORMAL HIGH (ref 0.0–149.0)
VLDL: 69.2 mg/dL — ABNORMAL HIGH (ref 0.0–40.0)

## 2023-03-14 LAB — CBC
HCT: 41.4 % (ref 36.0–46.0)
Hemoglobin: 13.9 g/dL (ref 12.0–15.0)
MCHC: 33.7 g/dL (ref 30.0–36.0)
MCV: 89.3 fl (ref 78.0–100.0)
Platelets: 354 10*3/uL (ref 150.0–400.0)
RBC: 4.64 Mil/uL (ref 3.87–5.11)
RDW: 13 % (ref 11.5–15.5)
WBC: 7.3 10*3/uL (ref 4.0–10.5)

## 2023-03-14 LAB — HEMOGLOBIN A1C: Hgb A1c MFr Bld: 5.4 % (ref 4.6–6.5)

## 2023-03-14 LAB — LDL CHOLESTEROL, DIRECT: Direct LDL: 122 mg/dL

## 2023-03-14 MED ORDER — FOLIC ACID 1 MG PO TABS
2.0000 mg | ORAL_TABLET | Freq: Every evening | ORAL | 3 refills | Status: DC
Start: 2023-03-14 — End: 2023-12-02

## 2023-03-14 NOTE — Progress Notes (Signed)
42 y.o. [id number] Married White or Caucasian female here for annual exam.  Lots of stressors this past year.  43 yo husband diagnosed with rectal cancer.  Genetic testing showed immunotherapy could provide a cure.  Was treated initially with immunotherapy and then has a small resection.  Does not have a colostomy.  Surgery was 02/28/2023.  He is doing well . He was never out work.    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    Had physical yesterday and buspar was added.  She hasn't started this yet.    Kids are soon to be 5 and 2.    Menstrual cycles are regular and still heavy the first day or two.  We discussed Lysteda but she never took it.  Still has it on hand.    Patient's last menstrual period was 03/14/2023.          Sexually active: Yes.    The current method of family planning is tubal ligation.    Smoker:  no  Health Maintenance: Pap:  03/07/2022 History of abnormal Pap:  h/o LGSIL and +HR HPV MMG:  03/20/2022 Colonoscopy:  guidelines reviewed Screening Labs: done yesterday   reports that she has never smoked. She has never used smokeless tobacco. She reports current alcohol use of about 1.0 - 2.0 standard drink of alcohol per week. She reports that she does not use drugs.  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   Infertility, female    Migraine     Past Surgical History:  Procedure  Laterality Date   AUGMENTATION MAMMAPLASTY Bilateral 01/17/2010   implants   CESAREAN SECTION N/A 06/16/2018   Procedure: Primary CESAREAN SECTION;  Surgeon: Olivia Mackie, MD;  [facility];  Service: Obstetrics;  Laterality: N/A;  EDD: 07/07/18 Allergy: Codeine   CESAREAN SECTION WITH BILATERAL TUBAL LIGATION N/A 11/30/2020   Procedure: Repeat CESAREAN SECTION;  Surgeon: Olivia Mackie, MD;  Location: [facility];  Service: Obstetrics;  Laterality: N/A;  EDD: 12/20/20   CHROMOPERTUBATION N/A 06/19/2016   Procedure: CHROMOPERTUBATION;  Surgeon: Fermin Schwab, MD;  Location: [facility];  Service: Gynecology;  Laterality: N/A;   COLPOSCOPY  2014, 2015   2015 LGSIL, neg ECC   DILATION AND EVACUATION  12/30/2007   missed ab   LAPAROSCOPIC GELPORT ASSISTED MYOMECTOMY N/A 06/19/2016   Procedure: LAPAROSCOPIC GELPORT ASSISTED MYOMECTOMY;  Surgeon: Fermin Schwab, MD;  Location: [facility];  Service: Gynecology;  Laterality: N/A;   OVARIAN CYST REMOVAL Left 06/19/2016   Procedure: LEFT OVARIAN CYSTECTOMY;  Surgeon: Fermin Schwab, MD;  Location: [facility];  Service: Gynecology;  Laterality: Left;   TRANSTHORACIC ECHOCARDIOGRAM  01/08/2014   ef 56%/  mild to moderater TR/  trivial  PR    Current Outpatient Medications  Medication Sig Dispense Refill   busPIRone (BUSPAR) 5 MG tablet Take 1 tablet (5 mg total) by mouth 2 (two) times daily. For anxiety 180 tablet 0   Cholecalciferol (VITAMIN D3) 50 MCG (2000 UT) TABS Take 2,000 Units by mouth every evening.     escitalopram (LEXAPRO) 20 MG tablet TAKE 1 TABLET (20 MG TOTAL) BY MOUTH DAILY. FOR ANXIETY 90 tablet 2   folic acid (FOLVITE) 1 MG tablet TAKE 2 TABLETS (2 MG TOTAL) BY MOUTH EVERY EVENING. 60 tablet 0   Probiotic Product (ADVANCED PROBIOTIC PO) Take 1 capsule by mouth every evening.     propranolol (INDERAL) 10 MG tablet Take 1-2 tablets (10-20 mg total) by mouth 2  (two) times daily as needed (anxiety). 60 tablet 0   Zinc 50 MG TABS Take 50 mg by mouth every evening.     No current facility-administered medications for this visit.    Family History  Problem Relation Age of Onset   Thyroid disease Mother    Hypertension Mother    Hypertension Maternal Grandmother    Thyroid disease Maternal Grandmother    Kidney disease Maternal Grandmother    Heart attack Maternal Grandfather    Cancer Maternal Grandfather        lymph nodes   Cancer Paternal Grandmother        stomach   Heart disease Paternal Grandfather        pace maker   Hypotension Paternal Grandfather    Diabetes Maternal Aunt     ROS: Constitutional: negative Genitourinary:negative  Exam:   BP 127/67 (BP Location: Left Arm, Patient Position: Sitting, Cuff Size: Normal)   Pulse 73   Ht 5\' 4"  (1.626 m)   Wt 157 lb 3.2 oz (71.3 kg)   LMP 03/14/2023   BMI 26.98 kg/m   Height: 5\' 4"  (162.6 cm)  General appearance: alert, cooperative and appears stated age Head: Normocephalic, without obvious abnormality, atraumatic Neck: no adenopathy, supple, symmetrical, trachea midline and thyroid normal to inspection and palpation Lungs: clear to auscultation bilaterally Breasts: normal appearance, no masses or tenderness Heart: regular rate and rhythm Abdomen: soft, non-tender; bowel sounds normal; no masses,  no organomegaly Extremities: extremities normal, atraumatic, no cyanosis or edema Skin: Skin color, texture, turgor normal. No rashes or lesions Lymph nodes: Cervical, supraclavicular, and axillary nodes normal. No abnormal inguinal nodes palpated Neurologic: Grossly normal   Pelvic: External genitalia:  no lesions              Urethra:  normal appearing urethra with no masses, tenderness or lesions              Bartholins and Skenes: normal                 Vagina: normal appearing vagina with normal color and no discharge, no lesions              Cervix: no lesions               Pap taken: Yes.   Bimanual Exam:  Uterus:  normal size, contour, position, consistency, mobility, non-tender              Adnexa: normal adnexa and no mass, fullness, tenderness               Rectovaginal: Confirms               Anus:  normal sphincter tone, no lesions  Chaperone,  Ina Homes, CMA, was present for exam.  Assessment/Plan: 1. Well woman exam with routine gynecological exam - Pap smear and HR HPV obtained today - Mammogram ordered by PCP - Colonoscopy guidelines reviewed - lab work done yesterday - vaccines reviewed/updated  2. Cervical cancer screening - Cytology - PAP( [place])  3. MTHFR gene mutation - folic acid (FOLVITE) 1 MG tablet; Take 2 tablets (2 mg total) by mouth every evening.  Dispense: 180 tablet; Refill: 3  4. History of dermoid cyst excision  5. History of myomectomy  6. GAD (generalized anxiety disorder)  7. Menorrhagia with regular cycle  8. Palpitations - uses propranolol prn

## 2023-03-20 LAB — CYTOLOGY - PAP
Comment: NEGATIVE
Diagnosis: NEGATIVE
High risk HPV: NEGATIVE

## 2023-04-10 ENCOUNTER — Encounter (HOSPITAL_BASED_OUTPATIENT_CLINIC_OR_DEPARTMENT_OTHER): Payer: Self-pay | Admitting: Obstetrics & Gynecology

## 2023-05-09 ENCOUNTER — Ambulatory Visit (HOSPITAL_BASED_OUTPATIENT_CLINIC_OR_DEPARTMENT_OTHER)
Admission: RE | Admit: 2023-05-09 | Discharge: 2023-05-09 | Disposition: A | Discharge status: Home / Self Care | Payer: 59 | Source: Ambulatory Visit | Attending: Primary Care | Admitting: Primary Care

## 2023-05-09 DIAGNOSIS — Z1231 Encounter for screening mammogram for malignant neoplasm of breast: Secondary | ICD-10-CM | POA: Diagnosis present

## 2023-06-30 ENCOUNTER — Other Ambulatory Visit: Payer: Self-pay | Admitting: Primary Care

## 2023-06-30 DIAGNOSIS — F411 Generalized anxiety disorder: Secondary | ICD-10-CM

## 2023-08-08 ENCOUNTER — Other Ambulatory Visit: Payer: Self-pay | Admitting: Primary Care

## 2023-08-08 DIAGNOSIS — F411 Generalized anxiety disorder: Secondary | ICD-10-CM

## 2023-11-15 ENCOUNTER — Encounter (HOSPITAL_BASED_OUTPATIENT_CLINIC_OR_DEPARTMENT_OTHER): Payer: Self-pay | Admitting: Obstetrics & Gynecology

## 2023-12-02 ENCOUNTER — Ambulatory Visit (HOSPITAL_BASED_OUTPATIENT_CLINIC_OR_DEPARTMENT_OTHER): Admitting: Obstetrics & Gynecology

## 2023-12-02 ENCOUNTER — Encounter (HOSPITAL_BASED_OUTPATIENT_CLINIC_OR_DEPARTMENT_OTHER): Payer: Self-pay | Admitting: Obstetrics & Gynecology

## 2023-12-02 VITALS — BP 136/86 | HR 67 | Ht 64.0 in | Wt 162.0 lb

## 2023-12-02 DIAGNOSIS — Z8049 Family history of malignant neoplasm of other genital organs: Secondary | ICD-10-CM | POA: Diagnosis not present

## 2023-12-02 DIAGNOSIS — Z9889 Other specified postprocedural states: Secondary | ICD-10-CM

## 2023-12-02 DIAGNOSIS — R102 Pelvic and perineal pain: Secondary | ICD-10-CM | POA: Diagnosis not present

## 2023-12-02 DIAGNOSIS — E7800 Pure hypercholesterolemia, unspecified: Secondary | ICD-10-CM | POA: Diagnosis not present

## 2023-12-02 DIAGNOSIS — R635 Abnormal weight gain: Secondary | ICD-10-CM | POA: Diagnosis not present

## 2023-12-02 DIAGNOSIS — Z86018 Personal history of other benign neoplasm: Secondary | ICD-10-CM

## 2023-12-02 LAB — POCT URINALYSIS DIPSTICK
Appearance: NORMAL
Bilirubin, UA: NEGATIVE
Blood, UA: NEGATIVE
Glucose, UA: NEGATIVE
Ketones, UA: NEGATIVE
Leukocytes, UA: NEGATIVE
Nitrite, UA: NEGATIVE
Protein, UA: NEGATIVE
Spec Grav, UA: 1.02 (ref 1.010–1.025)
Urobilinogen, UA: 0.2 U/dL
pH, UA: 5.5 (ref 5.0–8.0)

## 2023-12-02 NOTE — Progress Notes (Signed)
 GYNECOLOGY  VISIT  CC:   pelvic pain  HPI: 43 y.o. [id number] Married White or Caucasian female here for complaint of pelvic pain that she describes as sharp shooting pain that is not constant.  She does feel it every day but it is not constant.  She has gained weight with her new job due to how sedentary.  Tries to get up every hour at work.  She commutes 30 minutes each way but will start going to [facility] two days a week.    Lots of stressors.  Husband having 1 year CT follow up from rectal cancer that is scheduled today.  He is doing well.    Mother was diagnosed uterine cancer last year.  Surgery appears to have been curative.  Genetic testing was negative.    Menstrual cycles are regular.  Flow is heavy for about 24 hours and then is pretty light.  Last pap was 03/14/2023 and was neg with neg HR HPV.    Also asks for lipids to be rechecked.  Was elevated when saw PCP but wasn't fasting.    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   Infertility, female    Migraine     MEDS:   Current Outpatient Medications on File Prior to Visit  Medication Sig Dispense Refill   escitalopram  (LEXAPRO ) 20 MG tablet TAKE 1 TABLET (20 MG TOTAL) BY MOUTH DAILY. FOR ANXIETY 90 tablet 1   Cholecalciferol (VITAMIN D3) 50 MCG (2000 UT) TABS Take 2,000 Units by mouth every evening. (Patient not taking: Reported on 12/02/2023)     folic acid  (FOLVITE ) 1 MG tablet Take 2  tablets (2 mg total) by mouth every evening. (Patient not taking: Reported on 12/02/2023) 180 tablet 3   Probiotic Product (ADVANCED PROBIOTIC PO) Take 1 capsule by mouth every evening. (Patient not taking: Reported on 12/02/2023)     propranolol  (INDERAL ) 10 MG tablet Take 1-2 tablets (10-20 mg total) by mouth 2 (two) times daily as needed (anxiety). (Patient not taking: Reported on 12/02/2023) 60 tablet 0   Zinc 50 MG TABS Take 50 mg by mouth every evening. (Patient not taking: Reported on 12/02/2023)     No current facility-administered medications on file prior to visit.    ALLERGIES: Codeine  SH:  married, non smoker  Review of Systems  Constitutional: Negative.   Gastrointestinal:        RLQ pain    PHYSICAL EXAMINATION:    BP 136/86 (Cuff Size: Normal)   Pulse 67   Ht 5\' 4"  (1.626 m)   Wt 162 lb (73.5 kg)   BMI 27.81 kg/m     General appearance: alert, cooperative and appears stated age Abdomen: soft, non-tender; bowel sounds normal; no masses,  no organomegaly Lymph:  no inguinal LAD noted  Pelvic: External genitalia:  no lesions  Urethra:  normal appearing urethra with no masses, tenderness or lesions              Bartholins and Skenes: normal                 Vagina: normal mucosa without prolapse or lesions              Cervix: no lesions              Bimanual Exam:  Uterus:  normal size, contour, position, consistency, mobility, non-tender              Adnexa: fullness on right side about 4cm in size, does not feel like a discrete mass     Chaperone, Maudine Sos, RN, was present for exam.  Assessment/Plan: 1. Pelvic pain (Primary) - urine dip negative.  With fullness on right will check ultrasound.  Ordered at [facility] - POCT urinalysis dipstick - US  PELVIC COMPLETE WITH TRANSVAGINAL; Future - if negative, will proceed with pelvic PT  2. Weight gain  3. Elevated LDL cholesterol level - will recheck labs today - Lipid panel - Hemoglobin A1c  4.  Family history of uterine cancer - mother diagnosed last year.  Negative genetic testing  5. History of dermoid cyst excision

## 2023-12-03 LAB — HEMOGLOBIN A1C
Est. average glucose Bld gHb Est-mCnc: 111 mg/dL
Hgb A1c MFr Bld: 5.5 % (ref 4.8–5.6)

## 2023-12-03 LAB — LIPID PANEL
Chol/HDL Ratio: 4.9 ratio — ABNORMAL HIGH (ref 0.0–4.4)
Cholesterol, Total: 199 mg/dL (ref 100–199)
HDL: 41 mg/dL (ref >39)
LDL Chol Calc (NIH): 121 mg/dL — ABNORMAL HIGH (ref 0–99)
Triglycerides: 208 mg/dL — ABNORMAL HIGH (ref 0–149)
VLDL Cholesterol Cal: 37 mg/dL (ref 5–40)

## 2023-12-05 ENCOUNTER — Ambulatory Visit
Admission: RE | Admit: 2023-12-05 | Discharge: 2023-12-05 | Disposition: A | Discharge status: Home / Self Care | Source: Ambulatory Visit | Attending: Obstetrics & Gynecology | Admitting: Obstetrics & Gynecology

## 2023-12-05 DIAGNOSIS — R102 Pelvic and perineal pain unspecified side: Secondary | ICD-10-CM

## 2023-12-11 ENCOUNTER — Ambulatory Visit (HOSPITAL_BASED_OUTPATIENT_CLINIC_OR_DEPARTMENT_OTHER): Payer: Self-pay | Admitting: Obstetrics & Gynecology

## 2023-12-11 DIAGNOSIS — N926 Irregular menstruation, unspecified: Secondary | ICD-10-CM

## 2023-12-20 ENCOUNTER — Other Ambulatory Visit (HOSPITAL_BASED_OUTPATIENT_CLINIC_OR_DEPARTMENT_OTHER)

## 2023-12-20 ENCOUNTER — Other Ambulatory Visit (HOSPITAL_COMMUNITY): Payer: Self-pay | Admitting: Primary Care

## 2023-12-20 DIAGNOSIS — N926 Irregular menstruation, unspecified: Secondary | ICD-10-CM

## 2023-12-21 LAB — ESTRADIOL: Estradiol: 59.9 pg/mL

## 2023-12-21 LAB — FOLLICLE STIMULATING HORMONE: FSH: 2.4 m[IU]/mL

## 2023-12-23 ENCOUNTER — Ambulatory Visit (HOSPITAL_BASED_OUTPATIENT_CLINIC_OR_DEPARTMENT_OTHER): Payer: Self-pay | Admitting: Obstetrics & Gynecology

## 2024-01-03 DIAGNOSIS — R051 Acute cough: Secondary | ICD-10-CM

## 2024-01-03 MED ORDER — BENZONATATE 200 MG PO CAPS: 200.0000 mg | ORAL_CAPSULE | Freq: Three times a day (TID) | ORAL | 0 refills | Status: DC | PRN

## 2024-01-05 ENCOUNTER — Telehealth: Admitting: Physician Assistant

## 2024-01-05 DIAGNOSIS — A084 Viral intestinal infection, unspecified: Secondary | ICD-10-CM

## 2024-01-05 NOTE — Progress Notes (Signed)
 I have spent 5 minutes in review of e-visit questionnaire, review and updating patient chart, medical decision making and response to patient.   Piedad Climes, PA-C

## 2024-01-05 NOTE — Progress Notes (Signed)
 E-Visit for Diarrhea  We are sorry that you are not feeling well.  Here is how we plan to help!  Based on what you have shared with me it looks like you have Acute Infectious Diarrhea.  Most cases of acute diarrhea are due to infections with virus and bacteria and are self-limited conditions lasting less than 14 days.  For your symptoms you may take Imodium 2 mg tablets that are over the counter at your local pharmacy. Take two tablet now and then one after each loose stool up to 6 a day.  Antibiotics are not needed for most people with diarrhea.  In the case of norovirus:   Norovirus Infection Norovirus infection causes inflammation in the stomach and intestines (gastroenteritis) and food poisoning. It is caused by exposure to a virus from a group of similar viruses called noroviruses. Norovirus spreads very easily from person to person (is very contagious). It often occurs in places where people are in close contact, such as schools, nursing homes, restaurants, and cruise ships. You can get it from food, water , surfaces, or other people who have the virus. Norovirus is also found in the stool (feces) or vomit of infected people. You can spread the infection as soon as you feel sick, and you may continue to be contagious after you recover. What are the causes? This condition is caused by contact with norovirus. You can catch norovirus if you: Eat or drink something that is contaminated with norovirus. Touch surfaces or objects that are contaminated with norovirus and then put your hand in or by your mouth or nose. Have direct contact with an infected person who may or may not still have symptoms. Share food, drink, or utensils with someone who is contagious with norovirus. What are the signs or symptoms? Symptoms usually begin within 12 hours to 2 days after you become infected. Most norovirus symptoms affect the digestive system.Symptoms may include: Nausea, vomiting, and  diarrhea. Stomach cramps. Fever. Chills. Headache. Muscle aches and tiredness. How is this diagnosed? This condition may be diagnosed based on: Your symptoms. A physical exam. A stool test. How is this treated? There is no specific treatment for norovirus. Most people get better without treatment in about 2 days. Young children, the elderly, and people who are already sick may take up to 6 days to recover. Follow these instructions at home:  Eating and drinking  Drink plenty of water  to replace fluids that are lost through diarrhea and vomiting. This prevents dehydration. Drink enough fluid to keep your urine pale yellow. Drink clear fluids in small amounts as you are able. Clear fluids include water , ice chips, fruit juice with water  added (diluted fruit juice), and low-calorie sports drinks. Avoid fluids that contain a lot of sugar or caffeine, such as energy drinks, sports drinks, and soda. Avoid alcohol. If instructed by your health care provider, drink an oral rehydration solution (ORS). This is a drink that is sold at pharmacies and retail stores. An ORS contains minerals (electrolytes) that you can lose through diarrhea and vomiting. Eat bland, easy-to-digest foods in small amounts as you are able. These foods include rice, lean meats, toast, and crackers. Avoid spicy or fatty foods. General instructions Rest at home while you recover. Do not prepare food for others while you are infected. Wait at least 3 days after you recover from the illness to do this. Take over-the-counter and prescription medicines only as told by your health care provider. Wash your hands frequently with soap and  water  for at least 20 seconds. Alcohol-based hand sanitizer can be used in addition to soap and water , but sanitizer should not be the only cleansing method because it is not effective at removing norovirus from your hands or surfaces. Make sure that each person in your household washes his or her  hands well and often. Keep all follow-up visits. This is important. How is this prevented? To help prevent the spread of norovirus: Stay at home if you are feeling sick. This will reduce the risk of spreading the virus to others. Wash your hands often with soap and water  for at least 20 seconds, especially after using the toilet, helping a child use the toilet, or changing a child's diaper. Wash fruits and vegetables thoroughly before peeling, preparing, or serving them. Throw out any food that a sick person may have touched. Disinfect contaminated surfaces immediately after someone in the household has been sick. Disinfect frequently used surfaces, such as counters, doorknobs, and faucets. Use a bleach-based household cleaner. Immediately remove and wash soiled clothes or sheets. Contact a health care provider if: You have vomiting, diarrhea, or stomach pain that gets worse. You have symptoms that do not go away after 3-6 days. You have a fever. You cannot drink without vomiting. You feel light-headed or dizzy. Your symptoms get worse. Get help right away if: You develop symptoms of dehydration that do not improve with fluid replacement, such as: Excessive sleepiness. Lack of tears. Very little urine production. Dry mouth. Muscle cramps. Weak pulse. Confusion. Summary Norovirus infection is common and often occurs in places where people are in close contact, such as schools, nursing homes, restaurants, and cruise ships. To help prevent the spread of this infection, wash hands with soap and water  for at least 20 seconds before handling food or after having contact with stool or body fluids. There is no specific treatment for norovirus, but most people get better without treatment in about 2 days. People who are healthy when infected often recover sooner than those who are elderly, young, or already sick. Replace lost fluids by drinking plenty of water , or by drinking oral rehydration  solution (ORS), which contains important minerals called electrolytes. This prevents dehydration. This information is not intended to replace advice given to you by your health care provider. Make sure you discuss any questions you have with your health care provider. Document Revised: 02/15/2021 Document Reviewed: 02/15/2021 Elsevier Patient Education  2024 Elsevier Inc.    HOME CARE We recommend changing your diet to help with your symptoms for the next few days. Drink plenty of fluids that contain water  salt and sugar. Sports drinks such as Gatorade may help.  You may try broths, soups, bananas, applesauce, soft breads, mashed potatoes or crackers.  You are considered infectious for as long as the diarrhea continues. Hand washing or use of alcohol based hand sanitizers is recommend. It is best to stay out of work or school until your symptoms stop.   GET HELP RIGHT AWAY If you have dark yellow colored urine or do not pass urine frequently you should drink more fluids.   If your symptoms worsen  If you feel like you are going to pass out (faint) You have a new problem  MAKE SURE YOU  Understand these instructions. Will watch your condition. Will get help right away if you are not doing well or get worse.  Thank you for choosing an e-visit.  Your e-visit answers were reviewed by a board certified advanced clinical practitioner  to complete your personal care plan. Depending upon the condition, your plan could have included both over the counter or prescription medications.  Please review your pharmacy choice. Make sure the pharmacy is open so you can pick up prescription now. If there is a problem, you may contact your provider through [employer] and have the prescription routed to another pharmacy.  Your safety is important to us . If you have drug allergies check your prescription carefully.   For the next 24 hours you can use MyChart to ask questions about today's visit,  request a non-urgent call back, or ask for a work or school excuse. You will get an email in the next two days asking about your experience. I hope that your e-visit has been valuable and will speed your recovery.

## 2024-01-09 ENCOUNTER — Encounter: Payer: Self-pay | Admitting: Primary Care

## 2024-01-09 ENCOUNTER — Ambulatory Visit: Admitting: Primary Care

## 2024-01-09 DIAGNOSIS — R051 Acute cough: Secondary | ICD-10-CM | POA: Diagnosis not present

## 2024-01-09 MED ORDER — BENZONATATE 200 MG PO CAPS: 200.0000 mg | ORAL_CAPSULE | Freq: Three times a day (TID) | ORAL | 0 refills | Status: DC | PRN

## 2024-01-09 MED ORDER — AMOXICILLIN-POT CLAVULANATE 875-125 MG PO TABS: 1.0000 | ORAL_TABLET | Freq: Two times a day (BID) | ORAL | 0 refills | Status: DC

## 2024-01-09 NOTE — Progress Notes (Signed)
 Subjective:    Patient ID: Casey Clarke, female    DOB: [birth year], 43 y.o.   MRN: [id number]  Cough Associated symptoms include chills and headaches. Pertinent negatives include no fever or sore throat.    Casey Clarke is a very pleasant 43 y.o. female who presents today to discuss acute cough.  Symptom onset 13 days ago cough. Since then her cough is constant during the day and night with chest congestion. Her daughter had the same symptoms prior to her symptoms onset. Last week she developed nausea, abdominal cramping, and diarrhea which lasted four days.   Today she continues to feel fatigued and a constat cough. She's been taking Tessalon  Perles without improvement.   Review of Systems  Constitutional:  Positive for chills and fatigue. Negative for fever.  HENT:  Positive for congestion. Negative for sore throat.   Respiratory:  Positive for cough. Negative for chest tightness.   Neurological:  Positive for headaches.         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   Infertility, female    Migraine     Social History   Socioeconomic History   Marital status: Married    Spouse name: Not on file   Number of children: 0   Years of education: Not on file   Highest education level: Not on file  Occupational History   Not on file  Tobacco Use   Smoking status: Never   Smokeless tobacco: Never  Vaping Use   Vaping status:  Never Used  Substance and Sexual Activity   Alcohol use: Yes    Alcohol/week: 1.0 - 2.0 standard drink of alcohol    Types: 1 - 2 Glasses of wine per week    Comment: social   Drug use: No   Sexual activity: Yes    Partners: Male    Birth control/protection: Surgical    Comment: BTL  Other Topics Concern   Not on file  Social History Narrative   Not on file   Social Drivers of Health   Financial Resource Strain: Not on file  Food Insecurity: No Food Insecurity (06/05/2018)   Hunger Vital Sign    Worried About Running Out of Food in the Last Year: Never true    Ran Out of Food in the Last Year: Never true  Transportation Needs: Unmet Transportation Needs (06/05/2018)   PRAPARE - Administrator, Civil Service (Medical): Yes    Lack of Transportation (Non-Medical): Not on file  Physical Activity: Not on file  Stress: No Stress Concern Present (06/05/2018)   [employer] of Occupational Health - Occupational Stress Questionnaire    Feeling of Stress : Only a little  Social Connections: Not on file  Intimate Partner Violence: Not At Risk (06/05/2018)   Humiliation, Afraid, Rape, and Kick questionnaire  Fear of Current or Ex-Partner: No    Emotionally Abused: No    Physically Abused: No    Sexually Abused: No    Past Surgical History:  Procedure Laterality Date   AUGMENTATION MAMMAPLASTY Bilateral 01/17/2010   implants   CESAREAN SECTION N/A 06/16/2018   Procedure: Primary CESAREAN SECTION;  Surgeon: Meriam Stamp, MD;  Location: [facility];  Service: Obstetrics;  Laterality: N/A;  EDD: 07/07/18 Allergy: Codeine   CESAREAN SECTION WITH BILATERAL TUBAL LIGATION N/A 11/30/2020   Procedure: Repeat CESAREAN SECTION;  Surgeon: Meriam Stamp, MD;  Location: [facility];  Service: Obstetrics;  Laterality: N/A;  EDD: 12/20/20   CHROMOPERTUBATION N/A 06/19/2016   Procedure: CHROMOPERTUBATION;  Surgeon: Asa Bjork, MD;  Location: [facility];  Service: Gynecology;  Laterality: N/A;   COLPOSCOPY  2014, 2015   2015 LGSIL, neg ECC   DILATION AND EVACUATION  12/30/2007   missed ab   LAPAROSCOPIC GELPORT ASSISTED MYOMECTOMY N/A 06/19/2016   Procedure: LAPAROSCOPIC GELPORT ASSISTED MYOMECTOMY;  Surgeon: Asa Bjork, MD;  Location: [facility] [place];  Service: Gynecology;  Laterality: N/A;   OVARIAN CYST REMOVAL Left 06/19/2016   Procedure: LEFT OVARIAN CYSTECTOMY;  Surgeon: Asa Bjork, MD;  Location: [facility];  Service: Gynecology;  Laterality: Left;   TRANSTHORACIC ECHOCARDIOGRAM  01/08/2014   ef 56%/  mild to [employer] TR/  trivial PR    Family History  Problem Relation Age of Onset   Thyroid  disease Mother    Hypertension Mother    Hypertension Maternal Grandmother    Thyroid  disease Maternal Grandmother    Kidney disease Maternal Grandmother    Heart attack Maternal Grandfather    Cancer Maternal Grandfather        lymph nodes   Cancer Paternal Grandmother        stomach   Heart disease Paternal Grandfather        pace maker   Hypotension Paternal Grandfather    Diabetes Maternal Aunt     Allergies  Allergen Reactions   Codeine Other (See Comments) and Palpitations    palpitations and heart races  Can take percocet per pt  palpitations and heart races, Can take percocet per pt    Current Outpatient Medications on File Prior to Visit  Medication Sig Dispense Refill   escitalopram  (LEXAPRO ) 20 MG tablet TAKE 1 TABLET (20 MG TOTAL) BY MOUTH DAILY. FOR ANXIETY 90 tablet 1   No current facility-administered medications on file prior to visit.    BP 136/84   Pulse 66   Temp 97.8 F (36.6 C) (Temporal)   Ht 5' 4 (1.626 m)   Wt 161 lb (73 kg)   LMP 12/23/2023   SpO2 100%   BMI 27.64 kg/m  Objective:   Physical Exam Constitutional:      Appearance: She is not ill-appearing.  HENT:     Right Ear: Tympanic membrane and ear canal normal.     Left Ear:  Tympanic membrane and ear canal normal.     Nose: No mucosal edema.     Right Sinus: No maxillary sinus tenderness or frontal sinus tenderness.     Left Sinus: No maxillary sinus tenderness or frontal sinus tenderness.     Mouth/Throat:     Mouth: Mucous membranes are moist.   Eyes:     Conjunctiva/sclera: Conjunctivae normal.    Cardiovascular:     Rate and Rhythm: Normal rate and regular rhythm.  Pulmonary:  Effort: Pulmonary effort is normal.     Breath sounds: Normal breath sounds. No wheezing or rhonchi.   Musculoskeletal:     Cervical back: Neck supple.   Skin:    General: Skin is warm and dry.           Assessment & Plan:  Acute cough Assessment & Plan: Could be viral, but given lack of improvement and duration of symptoms will treat for presumed bacterial cause.  Start Augmentin  antibiotics. Take 1 tablet by mouth twice daily for 7 days. Refill provided for Tessalon  Perles.  Follow-up as needed.  Orders: -     Amoxicillin -Pot Clavulanate; Take 1 tablet by mouth 2 (two) times daily.  Dispense: 14 tablet; Refill: 0 -     Benzonatate ; Take 1 capsule (200 mg total) by mouth 3 (three) times daily as needed for cough.  Dispense: 15 capsule; Refill: 0        Gabriel John, NP

## 2024-01-09 NOTE — Patient Instructions (Signed)
 Start Augmentin  antibiotics. Take 1 tablet by mouth twice daily for 7 days.  Continue Benzonatate  capsules for cough. Take 1 capsule by mouth three times daily as needed for cough.  It was a pleasure to see you today!

## 2024-01-09 NOTE — Telephone Encounter (Signed)
 Please schedule patient for virtual or in person visit either at the 3:40 pm slot for today (06/19) or on 06/20 in the afternoon.

## 2024-01-09 NOTE — Assessment & Plan Note (Signed)
 Could be viral, but given lack of improvement and duration of symptoms will treat for presumed bacterial cause.  Start Augmentin  antibiotics. Take 1 tablet by mouth twice daily for 7 days. Refill provided for Tessalon  Perles.  Follow-up as needed.

## 2024-02-18 ENCOUNTER — Other Ambulatory Visit: Payer: Self-pay | Admitting: Primary Care

## 2024-02-18 DIAGNOSIS — F411 Generalized anxiety disorder: Secondary | ICD-10-CM

## 2024-02-18 NOTE — Telephone Encounter (Signed)
 Spoke to pt, sch cpe for 03/24/24

## 2024-02-18 NOTE — Telephone Encounter (Signed)
Patient is due for CPE/follow up in late August, this will be required prior to any further refills.  Please schedule, thank you!   

## 2024-03-24 ENCOUNTER — Encounter: Admitting: Primary Care

## 2024-04-07 ENCOUNTER — Ambulatory Visit (INDEPENDENT_AMBULATORY_CARE_PROVIDER_SITE_OTHER): Admitting: Primary Care

## 2024-04-07 ENCOUNTER — Encounter: Payer: Self-pay | Admitting: Primary Care

## 2024-04-07 ENCOUNTER — Ambulatory Visit: Payer: Self-pay | Admitting: Primary Care

## 2024-04-07 ENCOUNTER — Ambulatory Visit (INDEPENDENT_AMBULATORY_CARE_PROVIDER_SITE_OTHER)
Admission: RE | Admit: 2024-04-07 | Discharge: 2024-04-07 | Disposition: A | Discharge status: Home / Self Care | Source: Ambulatory Visit | Attending: Primary Care | Admitting: Primary Care

## 2024-04-07 VITALS — BP 126/84 | HR 83 | Temp 98.0°F | Ht 64.5 in | Wt 163.2 lb

## 2024-04-07 DIAGNOSIS — F411 Generalized anxiety disorder: Secondary | ICD-10-CM

## 2024-04-07 DIAGNOSIS — M25552 Pain in left hip: Secondary | ICD-10-CM

## 2024-04-07 DIAGNOSIS — G43009 Migraine without aura, not intractable, without status migrainosus: Secondary | ICD-10-CM

## 2024-04-07 DIAGNOSIS — R1031 Right lower quadrant pain: Secondary | ICD-10-CM

## 2024-04-07 DIAGNOSIS — G8929 Other chronic pain: Secondary | ICD-10-CM

## 2024-04-07 DIAGNOSIS — Z0001 Encounter for general adult medical examination with abnormal findings: Secondary | ICD-10-CM | POA: Diagnosis not present

## 2024-04-07 DIAGNOSIS — Z1231 Encounter for screening mammogram for malignant neoplasm of breast: Secondary | ICD-10-CM

## 2024-04-07 DIAGNOSIS — G43909 Migraine, unspecified, not intractable, without status migrainosus: Secondary | ICD-10-CM | POA: Insufficient documentation

## 2024-04-07 LAB — HEPATIC FUNCTION PANEL
ALT: 55 U/L — ABNORMAL HIGH (ref 0–35)
AST: 31 U/L (ref 0–37)
Albumin: 4.2 g/dL (ref 3.5–5.2)
Alkaline Phosphatase: 32 U/L — ABNORMAL LOW (ref 39–117)
Bilirubin, Direct: 0.1 mg/dL (ref 0.0–0.3)
Total Bilirubin: 0.5 mg/dL (ref 0.2–1.2)
Total Protein: 6.9 g/dL (ref 6.0–8.3)

## 2024-04-07 LAB — CBC WITH DIFFERENTIAL/PLATELET
Basophils Absolute: 0 K/uL (ref 0.0–0.1)
Basophils Relative: 0.6 % (ref 0.0–3.0)
Eosinophils Absolute: 0.2 K/uL (ref 0.0–0.7)
Eosinophils Relative: 4.2 % (ref 0.0–5.0)
HCT: 41.2 % (ref 36.0–46.0)
Hemoglobin: 13.7 g/dL (ref 12.0–15.0)
Lymphocytes Relative: 28.5 % (ref 12.0–46.0)
Lymphs Abs: 1.7 K/uL (ref 0.7–4.0)
MCHC: 33.1 g/dL (ref 30.0–36.0)
MCV: 88.5 fl (ref 78.0–100.0)
Monocytes Absolute: 0.5 K/uL (ref 0.1–1.0)
Monocytes Relative: 7.6 % (ref 3.0–12.0)
Neutro Abs: 3.5 K/uL (ref 1.4–7.7)
Neutrophils Relative %: 59.1 % (ref 43.0–77.0)
Platelets: 316 K/uL (ref 150.0–400.0)
RBC: 4.66 Mil/uL (ref 3.87–5.11)
RDW: 13.2 % (ref 11.5–15.5)
WBC: 6 K/uL (ref 4.0–10.5)

## 2024-04-07 LAB — BASIC METABOLIC PANEL WITH GFR
BUN: 11 mg/dL (ref 6–23)
CO2: 26 meq/L (ref 19–32)
Calcium: 9.3 mg/dL (ref 8.4–10.5)
Chloride: 102 meq/L (ref 96–112)
Creatinine, Ser: 0.77 mg/dL (ref 0.40–1.20)
GFR: 95.24 mL/min (ref >60.00)
Glucose, Bld: 101 mg/dL — ABNORMAL HIGH (ref 70–99)
Potassium: 3.7 meq/L (ref 3.5–5.1)
Sodium: 137 meq/L (ref 135–145)

## 2024-04-07 NOTE — Assessment & Plan Note (Signed)
 Differentials include sciatic nerve irritation, piriformis syndrome, arthritis.  Will obtain plain films of the left hip today. Offered NSAID treatment for which she kindly declines. Consider physiatry referral or physical therapy referral.

## 2024-04-07 NOTE — Patient Instructions (Signed)
 Stop by the lab and xray prior to leaving today. I will notify you of your results once received.   You will receive a phone call regarding the CT scan.  [facility].   It was a pleasure to see you today!

## 2024-04-07 NOTE — Assessment & Plan Note (Signed)
 No alarm signs to suggest acute appendicitis.  Given her ongoing pain, coupled with negative ultrasound, will obtain CT abdomen pelvis for further evaluation.  She agreed. Orders placed.

## 2024-04-07 NOTE — Assessment & Plan Note (Signed)
 Immunizations UTD. Pap smear UTD.  Discussed the importance of a healthy diet and regular exercise in order for weight loss, and to reduce the risk of further co-morbidity.  Exam stable. Labs pending.  Follow up in 1 year for repeat physical.

## 2024-04-07 NOTE — Assessment & Plan Note (Signed)
 Stable.  Continue to monitor. Continue Lexapro  20 mg daily.

## 2024-04-07 NOTE — Assessment & Plan Note (Signed)
 Cyclical with menses.  Stable per patient. Continue to monitor.

## 2024-04-07 NOTE — Progress Notes (Signed)
 Subjective:    Patient ID: Casey Clarke, female    DOB: 1980-11-21, 43 y.o.   MRN: [id number]  Casey Clarke is a very pleasant 43 y.o. female who presents today for complete physical and follow up of chronic conditions.  She would also like to discuss left hip pain. Chronic for the last 1 year, intermittent but occurring daily. She sits more frequently than usual at her newer job, this is when her pain began. She describes her pain as sharp, throbbing pain, aching pain. Pain is improved with walking, worse with sitting. She denies injury/trauma.   She has been experiencing chronic bilateral pelvic pain and right lower quadrant abdominal pain since her tubes are tied.  She has discussed this with her OB/GYN who ordered an ultrasound in May 2025 which was negative for causes of pain.  She continues to experience her pain intermittently which does wake her from sleep.  She denies nausea, vomiting, fevers, chills.  Immunizations: -Tetanus: Completed in 2022 -Influenza: Will get at work.   Diet: Fair diet.  Exercise: No regular exercise.  Eye exam: Completed >1 year ago.  Dental exam: Completes semi-annually    Pap Smear: Completed in 2024 Mammogram: Completed in October 2024   BP Readings from Last 3 Encounters:  04/07/24 126/84  01/09/24 136/84  12/02/23 136/86       Review of Systems  Constitutional:  Negative for unexpected weight change.  HENT:  Negative for rhinorrhea.   Respiratory:  Negative for cough and shortness of breath.   Cardiovascular:  Negative for chest pain.  Gastrointestinal:  Positive for abdominal pain. Negative for constipation, diarrhea, nausea and vomiting.  Genitourinary:  Positive for pelvic pain. Negative for difficulty urinating and menstrual problem.  Musculoskeletal:  Positive for arthralgias.  Skin:  Negative for rash.  Allergic/Immunologic: Negative for environmental allergies.  Neurological:  Negative for dizziness and headaches.   Psychiatric/Behavioral:  The patient is nervous/anxious.          Past Medical History:  Diagnosis Date   Abnormal Pap smear of cervix 2014, 03/2014   ASCUS, pos HR HPV, 18/45; 2015 LGSIL, + HR HPV   AMA (advanced maternal age) primigravida 35+    Dermoid cyst of ovary, left 03/2016   Fibroid    GERD (gastroesophageal reflux disease)    H/O myomectomy 06/16/2018   Heart murmur    Heterozygous MTHFR mutation [id number]    History of dermoid cyst excision 03/07/2022   History of gastric ulcer    remote hx - resolved   History of kidney stones    History of myomectomy 11/30/2020   History of palpitations    hypersenitive to codeine and per pt sometimes tachy   History of recurrent miscarriages    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   Infertility, female    Migraine    Palpitations 03/11/2014    Social History   Socioeconomic History   Marital status: Married    Spouse name: Not on file   Number of children: 0   Years of education: Not on file   Highest education level: Not on file  Occupational History   Not on file  Tobacco Use   Smoking status: Never   Smokeless tobacco: Never  Vaping Use   Vaping status: Never Used  Substance and Sexual Activity   Alcohol use: Yes    Alcohol/week: 1.0 - 2.0 standard drink of alcohol    Types: 1 - 2 Glasses of wine per week    Comment:  social   Drug use: No   Sexual activity: Yes    Partners: Male    Birth control/protection: Surgical    Comment: BTL  Other Topics Concern   Not on file  Social History Narrative   Not on file   Social Drivers of Health   Financial Resource Strain: Not on file  Food Insecurity: No Food Insecurity (06/05/2018)   Hunger Vital Sign    Worried About Running Out of Food in the Last Year: Never true    Ran Out of Food in the Last Year: Never true  Transportation Needs: Unmet Transportation Needs (06/05/2018)   PRAPARE - Administrator, Civil Service (Medical): Yes    Lack of Transportation  (Non-Medical): Not on file  Physical Activity: Not on file  Stress: No Stress Concern Present (06/05/2018)   [employer] of Occupational Health - Occupational Stress Questionnaire    Feeling of Stress : Only a little  Social Connections: Not on file  Intimate Partner Violence: Not At Risk (06/05/2018)   Humiliation, Afraid, Rape, and Kick questionnaire    Fear of Current or Ex-Partner: No    Emotionally Abused: No    Physically Abused: No    Sexually Abused: No    Past Surgical History:  Procedure Laterality Date   AUGMENTATION MAMMAPLASTY Bilateral 01/17/2010   implants   CESAREAN SECTION N/A 06/16/2018   Procedure: Primary CESAREAN SECTION;  Surgeon: Gorge Ade, MD;  Location: [facility];  Service: Obstetrics;  Laterality: N/A;  EDD: 07/07/18 Allergy: Codeine   CESAREAN SECTION WITH BILATERAL TUBAL LIGATION N/A 11/30/2020   Procedure: Repeat CESAREAN SECTION;  Surgeon: Gorge Ade, MD;  Location: [facility];  Service: Obstetrics;  Laterality: N/A;  EDD: 12/20/20   CHROMOPERTUBATION N/A 06/19/2016   Procedure: CHROMOPERTUBATION;  Surgeon: Cynthia Loss, MD;  Location: [facility];  Service: Gynecology;  Laterality: N/A;   COLPOSCOPY  2014, 2015   2015 LGSIL, neg ECC   DILATION AND EVACUATION  12/30/2007   missed ab   LAPAROSCOPIC GELPORT ASSISTED MYOMECTOMY N/A 06/19/2016   Procedure: LAPAROSCOPIC GELPORT ASSISTED MYOMECTOMY;  Surgeon: Cynthia Loss, MD;  Location: [facility];  Service: Gynecology;  Laterality: N/A;   OVARIAN CYST REMOVAL Left 06/19/2016   Procedure: LEFT OVARIAN CYSTECTOMY;  Surgeon: Cynthia Loss, MD;  Location: [facility];  Service: Gynecology;  Laterality: Left;   TRANSTHORACIC ECHOCARDIOGRAM  01/08/2014   ef 56%/  mild to [employer] TR/  trivial PR    Family History  Problem Relation Age of Onset   Thyroid  disease Mother    Hypertension Mother    Hypertension Maternal  Grandmother    Thyroid  disease Maternal Grandmother    Kidney disease Maternal Grandmother    Heart attack Maternal Grandfather    Cancer Maternal Grandfather        lymph nodes   Cancer Paternal Grandmother        stomach   Heart disease Paternal Grandfather        pace maker   Hypotension Paternal Grandfather    Diabetes Maternal Aunt     Allergies  Allergen Reactions   Codeine Other (See Comments) and Palpitations    palpitations and heart races  Can take percocet per pt  palpitations and heart races, Can take percocet per pt    Current Outpatient Medications on File Prior to Visit  Medication Sig Dispense Refill   escitalopram  (LEXAPRO ) 20 MG tablet TAKE 1 TABLET (20 MG TOTAL)  BY MOUTH DAILY. FOR ANXIETY 90 tablet 0   No current facility-administered medications on file prior to visit.    BP 126/84   Pulse 83   Temp 98 F (36.7 C) (Oral)   Ht 5' 4.5 (1.638 m)   Wt 163 lb 4 oz (74 kg)   LMP 03/12/2024   SpO2 97%   BMI 27.59 kg/m  Objective:   Physical Exam HENT:     Right Ear: Tympanic membrane and ear canal normal.     Left Ear: Tympanic membrane and ear canal normal.  Eyes:     Pupils: Pupils are equal, round, and reactive to light.  Cardiovascular:     Rate and Rhythm: Normal rate and regular rhythm.  Pulmonary:     Effort: Pulmonary effort is normal.     Breath sounds: Normal breath sounds.  Abdominal:     General: Bowel sounds are normal.     Palpations: Abdomen is soft.     Tenderness: There is abdominal tenderness in the right lower quadrant.     Comments: Discomfort with deep palpation   Musculoskeletal:        General: Normal range of motion.     Cervical back: Neck supple.  Skin:    General: Skin is warm and dry.  Neurological:     Mental Status: She is alert and oriented to person, place, and time.     Cranial Nerves: No cranial nerve deficit.     Deep Tendon Reflexes:     Reflex Scores:      Patellar reflexes are 2+ on the right  side and 2+ on the left side. Psychiatric:        Mood and Affect: Mood normal.     Physical Exam        Assessment & Plan:  Encounter for annual general medical examination with abnormal findings in adult Assessment & Plan: Immunizations UTD. Pap smear UTD.  Discussed the importance of a healthy diet and regular exercise in order for weight loss, and to reduce the risk of further co-morbidity.  Exam stable. Labs pending.  Follow up in 1 year for repeat physical.    Migraine without aura and without status migrainosus, not intractable Assessment & Plan: Cyclical with menses.  Stable per patient. Continue to monitor.    Right lower quadrant abdominal pain Assessment & Plan: No alarm signs to suggest acute appendicitis.  Given her ongoing pain, coupled with negative ultrasound, will obtain CT abdomen pelvis for further evaluation.  She agreed. Orders placed.  Orders: -     Hepatic function panel -     CT ABDOMEN PELVIS W CONTRAST; Future -     Basic metabolic panel with GFR -     CBC with Differential/Platelet  Chronic left hip pain Assessment & Plan: Differentials include sciatic nerve irritation, piriformis syndrome, arthritis.  Will obtain plain films of the left hip today. Offered NSAID treatment for which she kindly declines. Consider physiatry referral or physical therapy referral.  Orders: -     DG HIP UNILAT W OR W/O PELVIS 2-3 VIEWS LEFT  Screening mammogram for breast cancer -     3D Screening Mammogram, Left and Right; Future  GAD (generalized anxiety disorder) Assessment & Plan: Stable.  Continue to monitor. Continue Lexapro  20 mg daily.     Assessment and Plan Assessment & Plan         Comer MARLA Gaskins, NP    History of Present Illness

## 2024-04-20 ENCOUNTER — Ambulatory Visit
Admission: RE | Admit: 2024-04-20 | Discharge: 2024-04-20 | Disposition: A | Discharge status: Home / Self Care | Source: Ambulatory Visit | Attending: Primary Care | Admitting: Primary Care

## 2024-04-20 DIAGNOSIS — R1031 Right lower quadrant pain: Secondary | ICD-10-CM | POA: Diagnosis present

## 2024-04-20 MED ORDER — IOHEXOL 300 MG/ML  SOLN
100.0000 mL | Freq: Once | INTRAMUSCULAR | Status: AC | PRN
  Administered 2024-04-20: 100 mL via INTRAVENOUS

## 2024-05-29 ENCOUNTER — Other Ambulatory Visit: Payer: Self-pay | Admitting: Primary Care

## 2024-05-29 DIAGNOSIS — F411 Generalized anxiety disorder: Secondary | ICD-10-CM

## 2024-08-25 ENCOUNTER — Encounter (HOSPITAL_BASED_OUTPATIENT_CLINIC_OR_DEPARTMENT_OTHER): Admitting: Radiology

## 2024-08-25 DIAGNOSIS — Z1231 Encounter for screening mammogram for malignant neoplasm of breast: Secondary | ICD-10-CM

## 2024-09-04 ENCOUNTER — Encounter (HOSPITAL_BASED_OUTPATIENT_CLINIC_OR_DEPARTMENT_OTHER): Admitting: Radiology
